# Patient Record
Sex: Male | Born: 1950
Health system: Southern US, Community
[De-identification: ages and names within clinical notes are randomized; demographics above are authoritative.]

## PROBLEM LIST (undated history)

## (undated) DIAGNOSIS — E785 Hyperlipidemia, unspecified: Secondary | ICD-10-CM

## (undated) DIAGNOSIS — Z972 Presence of dental prosthetic device (complete) (partial): Secondary | ICD-10-CM

## (undated) DIAGNOSIS — I10 Essential (primary) hypertension: Secondary | ICD-10-CM

## (undated) DIAGNOSIS — G231 Progressive supranuclear ophthalmoplegia [Steele-Richardson-Olszewski]: Secondary | ICD-10-CM

## (undated) DIAGNOSIS — G20C Parkinsonism, unspecified: Secondary | ICD-10-CM

## (undated) HISTORY — PX: PROSTATE SURGERY: SHX751

## (undated) HISTORY — DX: Essential (primary) hypertension: I10

## (undated) HISTORY — DX: Hyperlipidemia, unspecified: E78.5

## (undated) HISTORY — PX: LITHOTRIPSY: SUR834

## (undated) HISTORY — PX: TONSILLECTOMY: SUR1361

---

## 2007-03-16 ENCOUNTER — Ambulatory Visit: Payer: Self-pay | Admitting: Unknown Physician Specialty

## 2009-01-25 ENCOUNTER — Ambulatory Visit: Payer: Self-pay | Admitting: Family Medicine

## 2014-01-18 ENCOUNTER — Ambulatory Visit: Payer: Self-pay | Admitting: Urology

## 2014-01-18 DIAGNOSIS — R972 Elevated prostate specific antigen [PSA]: Secondary | ICD-10-CM | POA: Diagnosis not present

## 2014-01-18 DIAGNOSIS — N201 Calculus of ureter: Secondary | ICD-10-CM | POA: Diagnosis not present

## 2014-01-18 DIAGNOSIS — N400 Enlarged prostate without lower urinary tract symptoms: Secondary | ICD-10-CM | POA: Diagnosis not present

## 2014-01-18 DIAGNOSIS — R3129 Other microscopic hematuria: Secondary | ICD-10-CM | POA: Diagnosis not present

## 2014-01-19 DIAGNOSIS — Z23 Encounter for immunization: Secondary | ICD-10-CM | POA: Diagnosis not present

## 2014-01-27 DIAGNOSIS — E785 Hyperlipidemia, unspecified: Secondary | ICD-10-CM | POA: Diagnosis not present

## 2014-01-27 DIAGNOSIS — I10 Essential (primary) hypertension: Secondary | ICD-10-CM | POA: Diagnosis not present

## 2014-01-27 DIAGNOSIS — E039 Hypothyroidism, unspecified: Secondary | ICD-10-CM | POA: Diagnosis not present

## 2014-01-27 DIAGNOSIS — G500 Trigeminal neuralgia: Secondary | ICD-10-CM | POA: Diagnosis not present

## 2014-01-28 ENCOUNTER — Ambulatory Visit: Payer: Self-pay | Admitting: Family Medicine

## 2014-01-28 DIAGNOSIS — I77811 Abdominal aortic ectasia: Secondary | ICD-10-CM | POA: Diagnosis not present

## 2014-01-28 DIAGNOSIS — I714 Abdominal aortic aneurysm, without rupture: Secondary | ICD-10-CM | POA: Diagnosis not present

## 2014-08-01 DIAGNOSIS — R1310 Dysphagia, unspecified: Secondary | ICD-10-CM | POA: Diagnosis not present

## 2014-08-01 DIAGNOSIS — I10 Essential (primary) hypertension: Secondary | ICD-10-CM | POA: Diagnosis not present

## 2014-08-04 DIAGNOSIS — R1310 Dysphagia, unspecified: Secondary | ICD-10-CM | POA: Diagnosis not present

## 2014-08-04 DIAGNOSIS — IMO0001 Reserved for inherently not codable concepts without codable children: Secondary | ICD-10-CM | POA: Insufficient documentation

## 2014-08-18 ENCOUNTER — Ambulatory Visit
Admit: 2014-08-18 | Disposition: A | Discharge status: Home / Self Care | Payer: Self-pay | Attending: Unknown Physician Specialty | Admitting: Unknown Physician Specialty

## 2014-08-18 DIAGNOSIS — K222 Esophageal obstruction: Secondary | ICD-10-CM | POA: Diagnosis not present

## 2014-08-18 DIAGNOSIS — E785 Hyperlipidemia, unspecified: Secondary | ICD-10-CM | POA: Diagnosis not present

## 2014-08-18 DIAGNOSIS — Z79899 Other long term (current) drug therapy: Secondary | ICD-10-CM | POA: Diagnosis not present

## 2014-08-18 DIAGNOSIS — K210 Gastro-esophageal reflux disease with esophagitis: Secondary | ICD-10-CM | POA: Diagnosis not present

## 2014-08-18 DIAGNOSIS — R1310 Dysphagia, unspecified: Secondary | ICD-10-CM | POA: Diagnosis not present

## 2014-08-18 DIAGNOSIS — K2900 Acute gastritis without bleeding: Secondary | ICD-10-CM | POA: Diagnosis not present

## 2014-08-18 DIAGNOSIS — I10 Essential (primary) hypertension: Secondary | ICD-10-CM | POA: Diagnosis not present

## 2014-08-18 DIAGNOSIS — Z7982 Long term (current) use of aspirin: Secondary | ICD-10-CM | POA: Diagnosis not present

## 2014-08-18 DIAGNOSIS — E039 Hypothyroidism, unspecified: Secondary | ICD-10-CM | POA: Diagnosis not present

## 2014-08-18 DIAGNOSIS — Z9889 Other specified postprocedural states: Secondary | ICD-10-CM | POA: Diagnosis not present

## 2014-08-18 DIAGNOSIS — K2960 Other gastritis without bleeding: Secondary | ICD-10-CM | POA: Diagnosis not present

## 2014-08-18 DIAGNOSIS — K2970 Gastritis, unspecified, without bleeding: Secondary | ICD-10-CM | POA: Diagnosis not present

## 2014-08-18 DIAGNOSIS — K2270 Barrett's esophagus without dysplasia: Secondary | ICD-10-CM | POA: Diagnosis not present

## 2014-08-18 DIAGNOSIS — Z880 Allergy status to penicillin: Secondary | ICD-10-CM | POA: Diagnosis not present

## 2014-08-22 LAB — SURGICAL PATHOLOGY

## 2014-08-31 DIAGNOSIS — R1310 Dysphagia, unspecified: Secondary | ICD-10-CM | POA: Diagnosis not present

## 2014-08-31 DIAGNOSIS — I10 Essential (primary) hypertension: Secondary | ICD-10-CM | POA: Diagnosis not present

## 2014-10-07 ENCOUNTER — Telehealth: Payer: Self-pay | Admitting: *Deleted

## 2014-10-07 NOTE — Telephone Encounter (Signed)
Pt called and said that he feels like his sinus infection is back and was wondering if someone could call in a zpack. Would like a call back at phone number listed [phone]

## 2014-10-07 NOTE — Telephone Encounter (Signed)
Called and spoke with patient, he states that he has been congested. He has bells 'palsy, so it causes a lot of pain in his face. He is requesting an antibiotic, he states that he will call back when Dr.Crissman comes back in.  Izora Gala will you please have [place] call this patient.

## 2014-10-11 DIAGNOSIS — E039 Hypothyroidism, unspecified: Secondary | ICD-10-CM | POA: Insufficient documentation

## 2014-10-11 DIAGNOSIS — G500 Trigeminal neuralgia: Secondary | ICD-10-CM | POA: Insufficient documentation

## 2014-10-11 DIAGNOSIS — K2270 Barrett's esophagus without dysplasia: Secondary | ICD-10-CM | POA: Insufficient documentation

## 2014-10-11 DIAGNOSIS — K209 Esophagitis, unspecified: Secondary | ICD-10-CM

## 2014-10-11 DIAGNOSIS — I10 Essential (primary) hypertension: Secondary | ICD-10-CM | POA: Insufficient documentation

## 2014-10-11 DIAGNOSIS — R1310 Dysphagia, unspecified: Secondary | ICD-10-CM

## 2014-10-11 DIAGNOSIS — E785 Hyperlipidemia, unspecified: Secondary | ICD-10-CM

## 2014-10-11 MED ORDER — AZITHROMYCIN 250 MG PO TABS: ORAL_TABLET | ORAL | Status: DC

## 2014-10-11 NOTE — Telephone Encounter (Signed)
Pt cant come in with clasic sinusitis sx will rx

## 2014-10-12 ENCOUNTER — Ambulatory Visit: Payer: Self-pay | Admitting: Unknown Physician Specialty

## 2014-10-17 ENCOUNTER — Telehealth: Payer: Self-pay

## 2014-10-17 MED ORDER — BENZONATATE 100 MG PO CAPS: 100.0000 mg | ORAL_CAPSULE | Freq: Two times a day (BID) | ORAL | Status: DC | PRN

## 2014-10-17 NOTE — Telephone Encounter (Signed)
Call pt 

## 2014-10-17 NOTE — Telephone Encounter (Signed)
Patient states he finished his zpak on Sunday.  Saturday with headache and pressure, aches, chills stayed in bed all day.  Slept well Saturday night, sweated a lot and was much better as far as aches but his congestion and cough are still there.  He is wondering if he should try another antibiotic (NO PENICILLIN) - [facility]

## 2014-10-17 NOTE — Telephone Encounter (Signed)
Still caughing but better will give tessalon

## 2015-01-13 ENCOUNTER — Ambulatory Visit: Payer: Self-pay | Admitting: Urology

## 2015-01-24 ENCOUNTER — Ambulatory Visit: Payer: Self-pay | Admitting: Urology

## 2015-01-31 ENCOUNTER — Encounter: Payer: Self-pay | Admitting: Family Medicine

## 2015-01-31 ENCOUNTER — Ambulatory Visit (INDEPENDENT_AMBULATORY_CARE_PROVIDER_SITE_OTHER): Payer: Medicare Other | Admitting: Family Medicine

## 2015-01-31 ENCOUNTER — Other Ambulatory Visit: Payer: Self-pay | Admitting: Family Medicine

## 2015-01-31 VITALS — BP 135/79 | HR 56 | Temp 97.6°F | Ht 67.3 in | Wt 191.0 lb

## 2015-01-31 DIAGNOSIS — K209 Esophagitis, unspecified without bleeding: Secondary | ICD-10-CM

## 2015-01-31 DIAGNOSIS — E039 Hypothyroidism, unspecified: Secondary | ICD-10-CM

## 2015-01-31 DIAGNOSIS — I10 Essential (primary) hypertension: Secondary | ICD-10-CM

## 2015-01-31 DIAGNOSIS — Z23 Encounter for immunization: Secondary | ICD-10-CM | POA: Diagnosis not present

## 2015-01-31 DIAGNOSIS — Z0000 Encounter for general adult medical examination without abnormal findings: Secondary | ICD-10-CM | POA: Diagnosis not present

## 2015-01-31 DIAGNOSIS — K2270 Barrett's esophagus without dysplasia: Secondary | ICD-10-CM | POA: Diagnosis not present

## 2015-01-31 DIAGNOSIS — E785 Hyperlipidemia, unspecified: Secondary | ICD-10-CM

## 2015-01-31 LAB — URINALYSIS, ROUTINE W REFLEX MICROSCOPIC
BILIRUBIN UA: NEGATIVE
GLUCOSE, UA: NEGATIVE
KETONES UA: NEGATIVE
Leukocytes, UA: NEGATIVE
Nitrite, UA: NEGATIVE
PROTEIN UA: NEGATIVE
RBC UA: NEGATIVE
SPEC GRAV UA: 1.02 (ref 1.005–1.030)
Urobilinogen, Ur: 0.2 mg/dL (ref 0.2–1.0)
pH, UA: 7 (ref 5.0–7.5)

## 2015-01-31 MED ORDER — LOSARTAN POTASSIUM-HCTZ 100-12.5 MG PO TABS: 1.0000 | ORAL_TABLET | Freq: Every day | ORAL | Status: DC

## 2015-01-31 MED ORDER — OMEPRAZOLE 40 MG PO CPDR: 40.0000 mg | DELAYED_RELEASE_CAPSULE | Freq: Every day | ORAL | Status: DC

## 2015-01-31 MED ORDER — AZITHROMYCIN 250 MG PO TABS: ORAL_TABLET | ORAL | Status: DC

## 2015-01-31 MED ORDER — LEVOTHYROXINE SODIUM 50 MCG PO TABS: 50.0000 ug | ORAL_TABLET | Freq: Every day | ORAL | Status: DC

## 2015-01-31 MED ORDER — FLUTICASONE PROPIONATE 50 MCG/ACT NA SUSP: 2.0000 | Freq: Every day | NASAL | Status: DC

## 2015-01-31 MED ORDER — AMLODIPINE BESYLATE 5 MG PO TABS: 5.0000 mg | ORAL_TABLET | Freq: Every day | ORAL | Status: DC

## 2015-01-31 NOTE — Progress Notes (Signed)
BP 135/79 mmHg  Pulse 56  Temp(Src) 97.6 F (36.4 C)  Ht 5' 7.3" (1.709 m)  Wt 191 lb (86.637 kg)  BMI 29.66 kg/m2  SpO2 99%   Subjective:    Patient ID: Arthur Richardson, male    DOB: Apr 29, 1951, 64 y.o.   MRN: [id number]  HPI: Arthur Richardson is a 64 y.o. male  Chief Complaint  Patient presents with  . Annual Exam   AWV metrics done  Patient doing well with omeprazole for Barrett's no complaints of dysphagia.  Hypertension doing well good control home checks doing well.  Thyroid no complaints no symptoms takes medicines faithfully  Hypercholesterol doing well no complaints from medications.  Takes medicines without side effects and faithfully.  Relevant past medical, surgical, family and social history reviewed and updated as indicated. Interim medical history since our last visit reviewed. Allergies and medications reviewed and updated.  Review of Systems  Constitutional: Negative.   HENT: Negative.   Eyes: Negative.   Respiratory: Negative.   Cardiovascular: Negative.   Gastrointestinal: Negative.   Endocrine: Negative.   Genitourinary: Negative.   Musculoskeletal: Negative.   Skin: Negative.   Allergic/Immunologic: Negative.   Neurological: Negative.   Hematological: Negative.   Psychiatric/Behavioral: Negative.     Per HPI unless specifically indicated above     Objective:    BP 135/79 mmHg  Pulse 56  Temp(Src) 97.6 F (36.4 C)  Ht 5' 7.3" (1.709 m)  Wt 191 lb (86.637 kg)  BMI 29.66 kg/m2  SpO2 99%  Wt Readings from Last 3 Encounters:  01/31/15 191 lb (86.637 kg)  08/31/14 191 lb (86.637 kg)    Physical Exam  Constitutional: He is oriented to person, place, and time. He appears well-developed and well-nourished.  HENT:  Head: Normocephalic and atraumatic.  Right Ear: External ear normal.  Left Ear: External ear normal.  Eyes: Conjunctivae and EOM are normal. Pupils are equal, round, and reactive to light.  Neck: Normal range  of motion. Neck supple.  Cardiovascular: Normal rate, regular rhythm, normal heart sounds and intact distal pulses.   Pulmonary/Chest: Effort normal and breath sounds normal.  Abdominal: Soft. Bowel sounds are normal. There is no splenomegaly or hepatomegaly.  Genitourinary:  Will be done at urology tomorrow.  Musculoskeletal: Normal range of motion.  Neurological: He is alert and oriented to person, place, and time. He has normal reflexes.  Skin: No rash noted. No erythema.  Psychiatric: He has a normal mood and affect. His behavior is normal. Judgment and thought content normal.    Results for orders placed or performed during the hospital encounter of 08/18/14  Surgical pathology  Result Value Ref Range   SURGICAL PATHOLOGY      Surgical Procedure CASE: [id number] PATIENT: Arthur Richardson Surgical Pathology Report     SPECIMEN SUBMITTED: A. Antrum, biopsy B. GEJ, biopsy  CLINICAL HISTORY: None provided  PRE-OPERATIVE DIAGNOSIS: Dysphagia  POST-OPERATIVE DIAGNOSIS: Erosive gastritis, Schatzki ring     DIAGNOSIS: A. ANTRUM; BIOPSY: - ANTRAL MUCOSA WITH CHANGES CONSISTENT WITH HEALING EROSIVE GASTRITIS. - NEGATIVE FOR H. PYLORI, DYSPLASIA, AND MALIGNANCY.  B. GE JUNCTION; BIOPSY: - SQUAMOCOLUMNAR MUCOSA WITH CHANGES CONSISTENT WITH REFLUX ESOPHAGITIS. - FOCAL INTESTINAL METAPLASIA, CORRELATION WITH ENDOSCOPIC FINDINGS IS REQUIRED. - NEGATIVE FOR DYSPLASIA AND MALIGNANCY.   GROSS DESCRIPTION:  A. Labeled: antral biopsies Tissue Fragment(s): 2 Measurement: 0.3 cm Comment: pink  Entirely submitted in cassette(s): 1  B. Labeled: gej biopsy Tissue Fragment(s): 2 Measurement: 0.2-0.3 cm Comment: pink  Entirely submitted in cassette(s): 1     Final Diagnosis perfo rmed by Quay Burow, MD.  Electronically signed 08/19/2014 2:00:24PM    The electronic signature indicates that the named Attending Pathologist has evaluated the specimen  Technical  component performed at [facility], [address] [place], [place] [address] Lab: [phone] Dir: Darrick Penna. Evette Doffing, MD  Professional component performed at [facility], [facility], [place], [place], [place] [address] Lab: [phone] Dir: Dellia Nims. Reuel Derby, MD        Assessment & Plan:   Problem List Items Addressed This Visit      Cardiovascular and Mediastinum   Hypertension    The current medical regimen is effective;  continue present plan and medications.       Relevant Medications   losartan-hydrochlorothiazide (HYZAAR) 100-12.5 MG tablet   amLODipine (NORVASC) 5 MG tablet   Other Relevant Orders   Comprehensive metabolic panel   CBC with Differential/Platelet   Urinalysis, Routine w reflex microscopic (not at [facility])     Digestive   Barrett's esophagus with esophagitis    The current medical regimen is effective;  continue present plan and medications.       Relevant Orders   Comprehensive metabolic panel   CBC with Differential/Platelet     Endocrine   Hypothyroidism    The current medical regimen is effective;  continue present plan and medications.       Relevant Medications   levothyroxine (SYNTHROID, LEVOTHROID) 50 MCG tablet   Other Relevant Orders   Comprehensive metabolic panel   TSH     Other   Hyperlipidemia    Diet controled      Relevant Medications   losartan-hydrochlorothiazide (HYZAAR) 100-12.5 MG tablet   amLODipine (NORVASC) 5 MG tablet   Other Relevant Orders   Comprehensive metabolic panel   Lipid panel    Other Visit Diagnoses    Immunization due    -  Primary    Relevant Orders    Flu Vaccine QUAD 36+ mos PF IM (Fluarix & Fluzone Quad PF) (Completed)    PE (physical exam), annual            Follow up plan: Return in about 6 months (around 08/01/2015), or if symptoms worsen or fail to improve, for Med check BMP.

## 2015-01-31 NOTE — Assessment & Plan Note (Signed)
The current medical regimen is effective;  continue present plan and medications.  

## 2015-01-31 NOTE — Assessment & Plan Note (Signed)
Diet controled 

## 2015-02-01 ENCOUNTER — Ambulatory Visit (INDEPENDENT_AMBULATORY_CARE_PROVIDER_SITE_OTHER): Payer: Medicare Other | Admitting: Urology

## 2015-02-01 ENCOUNTER — Encounter: Payer: Self-pay | Admitting: Family Medicine

## 2015-02-01 ENCOUNTER — Encounter: Payer: Self-pay | Admitting: Urology

## 2015-02-01 VITALS — BP 158/94 | HR 66 | Ht 68.0 in | Wt 191.7 lb

## 2015-02-01 DIAGNOSIS — R972 Elevated prostate specific antigen [PSA]: Secondary | ICD-10-CM

## 2015-02-01 DIAGNOSIS — N401 Enlarged prostate with lower urinary tract symptoms: Secondary | ICD-10-CM | POA: Diagnosis not present

## 2015-02-01 DIAGNOSIS — N400 Enlarged prostate without lower urinary tract symptoms: Secondary | ICD-10-CM | POA: Diagnosis not present

## 2015-02-01 LAB — CBC WITH DIFFERENTIAL/PLATELET
BASOS: 1 %
Basophils Absolute: 0.1 10*3/uL (ref 0.0–0.2)
EOS (ABSOLUTE): 0.2 10*3/uL (ref 0.0–0.4)
EOS: 3 %
HEMATOCRIT: 44.7 % (ref 37.5–51.0)
Hemoglobin: 15.2 g/dL (ref 12.6–17.7)
IMMATURE GRANULOCYTES: 0 %
Immature Grans (Abs): 0 10*3/uL (ref 0.0–0.1)
LYMPHS ABS: 2.4 10*3/uL (ref 0.7–3.1)
Lymphs: 37 %
MCH: 31.7 pg (ref 26.6–33.0)
MCHC: 34 g/dL (ref 31.5–35.7)
MCV: 93 fL (ref 79–97)
Monocytes Absolute: 0.7 10*3/uL (ref 0.1–0.9)
Monocytes: 10 %
NEUTROS PCT: 49 %
Neutrophils Absolute: 3.3 10*3/uL (ref 1.4–7.0)
PLATELETS: 250 10*3/uL (ref 150–379)
RBC: 4.8 x10E6/uL (ref 4.14–5.80)
RDW: 13.2 % (ref 12.3–15.4)
WBC: 6.6 10*3/uL (ref 3.4–10.8)

## 2015-02-01 LAB — MICROSCOPIC EXAMINATION
BACTERIA UA: NONE SEEN
EPITHELIAL CELLS (NON RENAL): NONE SEEN /HPF (ref 0–10)
RBC, UA: NONE SEEN /hpf (ref 0–?)
RENAL EPITHEL UA: NONE SEEN /HPF
WBC, UA: NONE SEEN /hpf (ref 0–?)

## 2015-02-01 LAB — COMPREHENSIVE METABOLIC PANEL
ALBUMIN: 4.4 g/dL (ref 3.6–4.8)
ALT: 34 IU/L (ref 0–44)
AST: 27 IU/L (ref 0–40)
Albumin/Globulin Ratio: 1.6 (ref 1.1–2.5)
Alkaline Phosphatase: 120 IU/L — ABNORMAL HIGH (ref 39–117)
BUN / CREAT RATIO: 12 (ref 10–22)
BUN: 13 mg/dL (ref 8–27)
Bilirubin Total: 0.4 mg/dL (ref 0.0–1.2)
CALCIUM: 9.8 mg/dL (ref 8.6–10.2)
CHLORIDE: 99 mmol/L (ref 97–108)
CO2: 24 mmol/L (ref 18–29)
CREATININE: 1.08 mg/dL (ref 0.76–1.27)
GFR calc non Af Amer: 71 mL/min/{1.73_m2} (ref >59)
GFR, EST AFRICAN AMERICAN: 82 mL/min/{1.73_m2} (ref >59)
GLUCOSE: 102 mg/dL — AB (ref 65–99)
Globulin, Total: 2.7 g/dL (ref 1.5–4.5)
Potassium: 4.8 mmol/L (ref 3.5–5.2)
Sodium: 139 mmol/L (ref 134–144)
TOTAL PROTEIN: 7.1 g/dL (ref 6.0–8.5)

## 2015-02-01 LAB — URINALYSIS, COMPLETE
BILIRUBIN UA: NEGATIVE
Glucose, UA: NEGATIVE
Ketones, UA: NEGATIVE
LEUKOCYTES UA: NEGATIVE
Nitrite, UA: NEGATIVE
PH UA: 7 (ref 5.0–7.5)
PROTEIN UA: NEGATIVE
RBC, UA: NEGATIVE
Specific Gravity, UA: 1.02 (ref 1.005–1.030)
Urobilinogen, Ur: 0.2 mg/dL (ref 0.2–1.0)

## 2015-02-01 LAB — LIPID PANEL
Chol/HDL Ratio: 3.9 ratio units (ref 0.0–5.0)
Cholesterol, Total: 224 mg/dL — ABNORMAL HIGH (ref 100–199)
HDL: 57 mg/dL (ref >39)
LDL CALC: 146 mg/dL — AB (ref 0–99)
Triglycerides: 106 mg/dL (ref 0–149)
VLDL Cholesterol Cal: 21 mg/dL (ref 5–40)

## 2015-02-01 LAB — TSH: TSH: 1.9 u[IU]/mL (ref 0.450–4.500)

## 2015-02-01 NOTE — Progress Notes (Signed)
02/01/2015 9:46 AM   Arthur Richardson Arthur Richardson [birth year] [id number]  Referring provider: No referring provider defined for this encounter.  Chief Complaint  Patient presents with  . Elevated PSA    1year    HPI: Patient here for routine yearly follow-up for persistently C PSA elevation of 3.5-44. Said biopsy in the past by Dr. Ernst Richardson negative for cancer he has no voiding symptomatology of urgency frequency dysuria or nocturia set her up. He generally has excellent voiding and a good sex life   PMH: Past Medical History  Diagnosis Date  . Hypertension   . Hyperlipidemia     Surgical History: Past Surgical History  Procedure Laterality Date  . Prostate surgery      Biopsy= Negative  . Tonsillectomy    . Lithotripsy  09/1998 & 07/29/1999    Due to nephrolithiasis    Home Medications:    Medication List       This list is accurate as of: 02/01/15  9:46 AM.  Always use your most recent med list.               amLODipine 5 MG tablet  Commonly known as:  NORVASC  Take 1 tablet (5 mg total) by mouth daily.     aspirin EC 81 MG tablet  Take 81 mg by mouth daily.     azithromycin 250 MG tablet  Commonly known as:  ZITHROMAX  2 now then 1 a day     fexofenadine 180 MG tablet  Commonly known as:  ALLEGRA  Take 180 mg by mouth daily.     fluticasone 50 MCG/ACT nasal spray  Commonly known as:  FLONASE  Place 2 sprays into both nostrils daily.     hydrocortisone 2.5 % rectal cream  Commonly known as:  ANUSOL-HC  Place 1 application rectally daily as needed for hemorrhoids or itching.     levothyroxine 50 MCG tablet  Commonly known as:  SYNTHROID, LEVOTHROID  Take 1 tablet (50 mcg total) by mouth daily before breakfast.     losartan-hydrochlorothiazide 100-12.5 MG tablet  Commonly known as:  HYZAAR  Take 1 tablet by mouth daily.     omeprazole 40 MG capsule  Commonly known as:  PRILOSEC  Take 1 capsule (40 mg total) by mouth daily.        Allergies:    Allergies  Allergen Reactions  . Penicillins     Family History: Family History  Problem Relation Age of Onset  . Stroke Father   . Cancer Sister     Pancreatic Cancer    Social History:  reports that he quit smoking about 35 years ago. His smoking use included Cigarettes. His smokeless tobacco use includes Chew. He reports that he drinks about 1.8 - 2.4 oz of alcohol per week. He reports that he does not use illicit drugs.  ROS: UROLOGY Frequent Urination?: No Hard to postpone urination?: No Burning/pain with urination?: No Get up at night to urinate?: No Leakage of urine?: No Urine stream starts and stops?: No Trouble starting stream?: No Do you have to strain to urinate?: No Blood in urine?: No Urinary tract infection?: No Sexually transmitted disease?: No Injury to kidneys or bladder?: No Painful intercourse?: No Weak stream?: No Erection problems?: No Penile pain?: No  Gastrointestinal Nausea?: No Vomiting?: No Indigestion/heartburn?: No Diarrhea?: No Constipation?: No  Constitutional Fever: No Night sweats?: No Weight loss?: No Fatigue?: No  Skin Skin rash/lesions?: No Itching?: No  Eyes Blurred vision?: No  Double vision?: No  Ears/Nose/Throat Sore throat?: No Sinus problems?: No  Hematologic/Lymphatic Swollen glands?: No Easy bruising?: No  Cardiovascular Leg swelling?: No Chest pain?: No  Respiratory Cough?: No Shortness of breath?: No  Endocrine Excessive thirst?: No  Musculoskeletal Back pain?: No Joint pain?: No  Neurological Headaches?: No Dizziness?: No  Psychologic Depression?: No Anxiety?: No  Physical Exam: BP 158/94 mmHg  Pulse 66  Ht 5\' 8"  (1.727 m)  Wt 191 lb 11.2 oz (86.955 kg)  BMI 29.15 kg/m2  Constitutional:  Alert and oriented, No acute distress. HEENT: [place] AT, moist mucus membranes.  Trachea midline, no masses. Cardiovascular: No clubbing, cyanosis, or edema. Respiratory: Normal respiratory  effort, no increased work of breathing. GI: Abdomen is soft, nontender, nondistended, no abdominal masses GU: No CVA tenderness. Penis is normal length circumcised. Testes are descended and normal with no masses from sildenafil. Rectal sphincter is good there are no rectal masses hemorrhoids or fissures. Prostate is grade 2 over 4 smooth nonnodular and firm Skin: No rashes, bruises or suspicious lesions. Lymph: No cervical or inguinal adenopathy. Neurologic: Grossly intact, no focal deficits, moving all 4 extremities. Psychiatric: Normal mood and affect.  Laboratory Data: Lab Results  Component Value Date   WBC 6.6 01/31/2015   HCT 44.7 01/31/2015    Lab Results  Component Value Date   CREATININE 1.08 01/31/2015    No results found for: PSA  No results found for: TESTOSTERONE  No results found for: HGBA1C  Urinalysis    Component Value Date/Time   GLUCOSEU Negative 01/31/2015 0000   BILIRUBINUR Negative 01/31/2015 0000   NITRITE Negative 01/31/2015 0000   LEUKOCYTESUR Negative 01/31/2015 0000    Pertinent Imaging: None  Assessment & Plan:    1. Elevated PSA patient has had a stable PSA of 3-4 over the last 10 years. He's been seen by Dr. Ernst Richardson on a yearly basis for a retired. I saw him last year and it remained stable at 3.5. We obtained a PSA today as a screening exam. He's had negative biopsy in the past for his elevated PSA. He definitely does not want another biopsy. My physical exam of his prostate revealed no rectal masses prostate to be slightly enlarged. He's asymptomatic voiding symptoms. My plan is to see him in another urinalysis PSA elevates to a higher level then 5   - Urinalysis, Complete - PSA   No Follow-up on file.  Arthur Richardson, [facility] [address] [place], [place] [address] [phone]

## 2015-02-02 LAB — PSA: PROSTATE SPECIFIC AG, SERUM: 3.8 ng/mL (ref 0.0–4.0)

## 2015-04-09 ENCOUNTER — Other Ambulatory Visit: Payer: Self-pay | Admitting: Family Medicine

## 2015-06-05 ENCOUNTER — Ambulatory Visit (INDEPENDENT_AMBULATORY_CARE_PROVIDER_SITE_OTHER): Payer: Medicare Other | Admitting: Family Medicine

## 2015-06-05 ENCOUNTER — Encounter: Payer: Self-pay | Admitting: Family Medicine

## 2015-06-05 VITALS — BP 132/78 | HR 71 | Temp 98.2°F | Ht 67.0 in | Wt 192.0 lb

## 2015-06-05 DIAGNOSIS — J019 Acute sinusitis, unspecified: Secondary | ICD-10-CM

## 2015-06-05 MED ORDER — HYDROCOD POLST-CPM POLST ER 10-8 MG/5ML PO SUER: 2.5000 mL | Freq: Two times a day (BID) | ORAL | Status: DC | PRN

## 2015-06-05 MED ORDER — AZITHROMYCIN 250 MG PO TABS: ORAL_TABLET | ORAL | Status: DC

## 2015-06-05 NOTE — Assessment & Plan Note (Signed)
Discussed sinusitis care and treatment of over-the-counter medications such as Mucinex Mucinex D Patient's hard coughing will give Tussionex patient understands its home for the medicine and will not drive with this

## 2015-06-05 NOTE — Progress Notes (Signed)
BP 132/78 mmHg  Pulse 71  Temp(Src) 98.2 F (36.8 C)  Ht 5\' 7"  (1.702 m)  Wt 192 lb (87.091 kg)  BMI 30.06 kg/m2  SpO2 98%   Subjective:    Patient ID: Arthur Richardson, male    DOB: 03-May-1950, 65 y.o.   MRN: [id number]  HPI: Arthur Richardson is a 65 y.o. male  Chief Complaint  Patient presents with  . URI   Patient with a month-long of feeling bad achy fever cough and head congestion and drainage with large amount of mucus coming out of his head a lot of facial pressure and sloshing-type sensation when he bends over initially got somewhat better over the last 2 weeks now over this last week is gotten much worse. Coughing a great deal which is productive Chest pain from coughing and states sore Otherwise blood pressure and medications of been doing well Relevant past medical, surgical, family and social history reviewed and updated as indicated. Interim medical history since our last visit reviewed. Allergies and medications reviewed and updated.  Review of Systems  Constitutional: Positive for fever, chills, diaphoresis and fatigue.  HENT: Positive for congestion, postnasal drip, rhinorrhea, sinus pressure, sneezing and sore throat.   Respiratory: Positive for cough, choking and shortness of breath.   Cardiovascular: Negative for chest pain, palpitations and leg swelling.  Gastrointestinal: Negative.     Per HPI unless specifically indicated above     Objective:    BP 132/78 mmHg  Pulse 71  Temp(Src) 98.2 F (36.8 C)  Ht 5\' 7"  (1.702 m)  Wt 192 lb (87.091 kg)  BMI 30.06 kg/m2  SpO2 98%  Wt Readings from Last 3 Encounters:  06/05/15 192 lb (87.091 kg)  02/01/15 191 lb 11.2 oz (86.955 kg)  01/31/15 191 lb (86.637 kg)    Physical Exam  Constitutional: He is oriented to person, place, and time. He appears well-developed and well-nourished. No distress.  HENT:  Head: Normocephalic and atraumatic.  Right Ear: Hearing and external ear normal.  Left Ear:  Hearing and external ear normal.  Nose: Nose normal.  Mouth/Throat: Oropharyngeal exudate present.  Eyes: Conjunctivae and lids are normal. Right eye exhibits no discharge. Left eye exhibits no discharge. No scleral icterus.  Pulmonary/Chest: Effort normal. No respiratory distress. He has no wheezes. He has no rales.  rhonchi  Musculoskeletal: Normal range of motion.  Lymphadenopathy:    He has no cervical adenopathy.  Neurological: He is alert and oriented to person, place, and time.  Skin: Skin is intact. No rash noted.  Psychiatric: He has a normal mood and affect. His speech is normal and behavior is normal. Judgment and thought content normal. Cognition and memory are normal.    Results for orders placed or performed in visit on 02/01/15  Microscopic Examination  Result Value Ref Range   WBC, UA None seen 0 -  5 /hpf   RBC, UA None seen 0 -  2 /hpf   Epithelial Cells (non renal) None seen 0 - 10 /hpf   Renal Epithel, UA None seen None seen /hpf   Bacteria, UA None seen None seen/Few  Urinalysis, Complete  Result Value Ref Range   Specific Gravity, UA 1.020 1.005 - 1.030   pH, UA 7.0 5.0 - 7.5   Color, UA Yellow Yellow   Appearance Ur Clear Clear   Leukocytes, UA Negative Negative   Protein, UA Negative Negative/Trace   Glucose, UA Negative Negative   Ketones, UA Negative Negative  RBC, UA Negative Negative   Bilirubin, UA Negative Negative   Urobilinogen, Ur 0.2 0.2 - 1.0 mg/dL   Nitrite, UA Negative Negative   Microscopic Examination See below:   PSA  Result Value Ref Range   Prostate Specific Ag, Serum 3.8 0.0 - 4.0 ng/mL      Assessment & Plan:   Problem List Items Addressed This Visit      Respiratory   Acute sinusitis - Primary    Discussed sinusitis care and treatment of over-the-counter medications such as Mucinex Mucinex D Patient's hard coughing will give Tussionex patient understands its home for the medicine and will not drive with this        Relevant Medications   azithromycin (ZITHROMAX) 250 MG tablet   chlorpheniramine-HYDROcodone (TUSSIONEX PENNKINETIC ER) 10-8 MG/5ML SUER       Follow up plan: Return for As scheduled.

## 2015-06-13 ENCOUNTER — Encounter: Payer: Self-pay | Admitting: Family Medicine

## 2015-07-21 ENCOUNTER — Telehealth: Payer: Self-pay

## 2015-07-21 NOTE — Telephone Encounter (Signed)
Pt called stating he was seen back in October of 2016. Pt stated he just received a [facility] bill and then realized he had not received his PSA results. Results were given.

## 2015-08-02 ENCOUNTER — Ambulatory Visit: Payer: Medicare Other | Admitting: Family Medicine

## 2015-08-07 ENCOUNTER — Encounter: Payer: Self-pay | Admitting: Family Medicine

## 2015-08-07 ENCOUNTER — Ambulatory Visit (INDEPENDENT_AMBULATORY_CARE_PROVIDER_SITE_OTHER): Payer: Medicare Other | Admitting: Family Medicine

## 2015-08-07 VITALS — BP 133/84 | HR 71 | Temp 98.1°F | Ht 67.2 in | Wt 195.0 lb

## 2015-08-07 DIAGNOSIS — Z113 Encounter for screening for infections with a predominantly sexual mode of transmission: Secondary | ICD-10-CM | POA: Diagnosis not present

## 2015-08-07 DIAGNOSIS — E039 Hypothyroidism, unspecified: Secondary | ICD-10-CM | POA: Diagnosis not present

## 2015-08-07 DIAGNOSIS — K209 Esophagitis, unspecified: Secondary | ICD-10-CM | POA: Diagnosis not present

## 2015-08-07 DIAGNOSIS — K2270 Barrett's esophagus without dysplasia: Secondary | ICD-10-CM | POA: Diagnosis not present

## 2015-08-07 DIAGNOSIS — I10 Essential (primary) hypertension: Secondary | ICD-10-CM

## 2015-08-07 DIAGNOSIS — E785 Hyperlipidemia, unspecified: Secondary | ICD-10-CM

## 2015-08-07 MED ORDER — ACYCLOVIR 5 % EX OINT: 1.0000 "application " | TOPICAL_OINTMENT | CUTANEOUS | Status: DC

## 2015-08-07 NOTE — Assessment & Plan Note (Signed)
The current medical regimen is effective;  continue present plan and medications.  

## 2015-08-07 NOTE — Progress Notes (Signed)
BP 133/84 mmHg  Pulse 71  Temp(Src) 98.1 F (36.7 C)  Ht 5' 7.2" (1.707 m)  Wt 195 lb (88.451 kg)  BMI 30.36 kg/m2  SpO2 99%   Subjective:    Patient ID: Arthur Richardson, male    DOB: [birth year], 65 y.o.   MRN: [id number]  HPI: Arthur Richardson is a 65 y.o. male  Chief Complaint  Patient presents with  . Hypertension  Patient doing well with blood pressure no complaints from medications takes faithfully with no side effects. Thyroid doing well with no side effects from medicines takes faithfully Reflux the same doing well with no complaints Allergies doing well also even in the allergy season.  Relevant past medical, surgical, family and social history reviewed and updated as indicated. Interim medical history since our last visit reviewed. Allergies and medications reviewed and updated.  Review of Systems  Constitutional: Negative.   Respiratory: Negative.   Cardiovascular: Negative.     Per HPI unless specifically indicated above     Objective:    BP 133/84 mmHg  Pulse 71  Temp(Src) 98.1 F (36.7 C)  Ht 5' 7.2" (1.707 m)  Wt 195 lb (88.451 kg)  BMI 30.36 kg/m2  SpO2 99%  Wt Readings from Last 3 Encounters:  08/07/15 195 lb (88.451 kg)  06/05/15 192 lb (87.091 kg)  02/01/15 191 lb 11.2 oz (86.955 kg)    Physical Exam  Constitutional: He is oriented to person, place, and time. He appears well-developed and well-nourished. No distress.  HENT:  Head: Normocephalic and atraumatic.  Right Ear: Hearing normal.  Left Ear: Hearing normal.  Nose: Nose normal.  Eyes: Conjunctivae and lids are normal. Right eye exhibits no discharge. Left eye exhibits no discharge. No scleral icterus.  Cardiovascular: Normal rate, regular rhythm and normal heart sounds.   Pulmonary/Chest: Effort normal and breath sounds normal. No respiratory distress.  Musculoskeletal: Normal range of motion.  Neurological: He is alert and oriented to person, place, and time.  Skin: Skin  is intact. No rash noted.  Psychiatric: He has a normal mood and affect. His speech is normal and behavior is normal. Judgment and thought content normal. Cognition and memory are normal.    Results for orders placed or performed in visit on 02/01/15  Microscopic Examination  Result Value Ref Range   WBC, UA None seen 0 -  5 /hpf   RBC, UA None seen 0 -  2 /hpf   Epithelial Cells (non renal) None seen 0 - 10 /hpf   Renal Epithel, UA None seen None seen /hpf   Bacteria, UA None seen None seen/Few  Urinalysis, Complete  Result Value Ref Range   Specific Gravity, UA 1.020 1.005 - 1.030   pH, UA 7.0 5.0 - 7.5   Color, UA Yellow Yellow   Appearance Ur Clear Clear   Leukocytes, UA Negative Negative   Protein, UA Negative Negative/Trace   Glucose, UA Negative Negative   Ketones, UA Negative Negative   RBC, UA Negative Negative   Bilirubin, UA Negative Negative   Urobilinogen, Ur 0.2 0.2 - 1.0 mg/dL   Nitrite, UA Negative Negative   Microscopic Examination See below:   PSA  Result Value Ref Range   Prostate Specific Ag, Serum 3.8 0.0 - 4.0 ng/mL      Assessment & Plan:   Problem List Items Addressed This Visit      Cardiovascular and Mediastinum   Hypertension - Primary    The current medical regimen  is effective;  continue present plan and medications.       Relevant Orders   Basic metabolic panel     Digestive   Barrett's esophagus with esophagitis    The current medical regimen is effective;  continue present plan and medications.         Endocrine   Hypothyroidism    The current medical regimen is effective;  continue present plan and medications.         Other   Hyperlipidemia    The current medical regimen is effective;  continue present plan and medications.        Other Visit Diagnoses    Routine screening for STI (sexually transmitted infection)        Relevant Orders    Hepatitis C Antibody        Follow up plan: Return in about 6 months  (around 02/06/2016) for Physical Exam.

## 2015-08-08 ENCOUNTER — Telehealth: Payer: Self-pay | Admitting: Family Medicine

## 2015-08-08 DIAGNOSIS — E871 Hypo-osmolality and hyponatremia: Secondary | ICD-10-CM

## 2015-08-08 LAB — BASIC METABOLIC PANEL
BUN/Creatinine Ratio: 16 (ref 10–24)
BUN: 16 mg/dL (ref 8–27)
CALCIUM: 10.1 mg/dL (ref 8.6–10.2)
CHLORIDE: 89 mmol/L — AB (ref 96–106)
CO2: 21 mmol/L (ref 18–29)
Creatinine, Ser: 1 mg/dL (ref 0.76–1.27)
GFR, EST AFRICAN AMERICAN: 90 mL/min/{1.73_m2} (ref >59)
GFR, EST NON AFRICAN AMERICAN: 78 mL/min/{1.73_m2} (ref >59)
Glucose: 109 mg/dL — ABNORMAL HIGH (ref 65–99)
Potassium: 4.2 mmol/L (ref 3.5–5.2)
Sodium: 130 mmol/L — ABNORMAL LOW (ref 134–144)

## 2015-08-08 LAB — HEPATITIS C ANTIBODY: Hep C Virus Ab: <0.1 s/co ratio (ref 0.0–0.9)

## 2015-08-08 NOTE — Telephone Encounter (Signed)
Phone call Discussed with patient low sodium patient drinking a lot of sodas will cut out liquids drink regular water smaller amounts recheck BMP in about a month.

## 2015-08-08 NOTE — Telephone Encounter (Signed)
-----   Message from Wynn Maudlin, [place] sent at 08/08/2015  5:21 PM EDT ----- labs

## 2015-09-07 ENCOUNTER — Other Ambulatory Visit: Payer: Medicare Other

## 2015-09-07 DIAGNOSIS — E871 Hypo-osmolality and hyponatremia: Secondary | ICD-10-CM | POA: Diagnosis not present

## 2015-09-08 LAB — BASIC METABOLIC PANEL
BUN/Creatinine Ratio: 9 — ABNORMAL LOW (ref 10–24)
BUN: 9 mg/dL (ref 8–27)
CO2: 21 mmol/L (ref 18–29)
Calcium: 9.5 mg/dL (ref 8.6–10.2)
Chloride: 95 mmol/L — ABNORMAL LOW (ref 96–106)
Creatinine, Ser: 1.04 mg/dL (ref 0.76–1.27)
GFR, EST AFRICAN AMERICAN: 86 mL/min/{1.73_m2} (ref >59)
GFR, EST NON AFRICAN AMERICAN: 74 mL/min/{1.73_m2} (ref >59)
Glucose: 107 mg/dL — ABNORMAL HIGH (ref 65–99)
POTASSIUM: 4.2 mmol/L (ref 3.5–5.2)
Sodium: 136 mmol/L (ref 134–144)

## 2015-09-11 ENCOUNTER — Encounter: Payer: Self-pay | Admitting: Family Medicine

## 2015-09-18 ENCOUNTER — Other Ambulatory Visit: Payer: Medicare Other

## 2015-12-12 ENCOUNTER — Other Ambulatory Visit: Payer: Self-pay | Admitting: Family Medicine

## 2016-01-06 IMAGING — CR DG ABDOMEN 1V
1 series · 1 of 1 positions shown · non-contrast
Comparison: None.

CLINICAL DATA: Microscopic hematuria. Prior history of urinary
tract calculi. No abdominal pain currently.

EXAM:
ABDOMEN - 1 VIEW

[kdxr kidney ureter bladder]
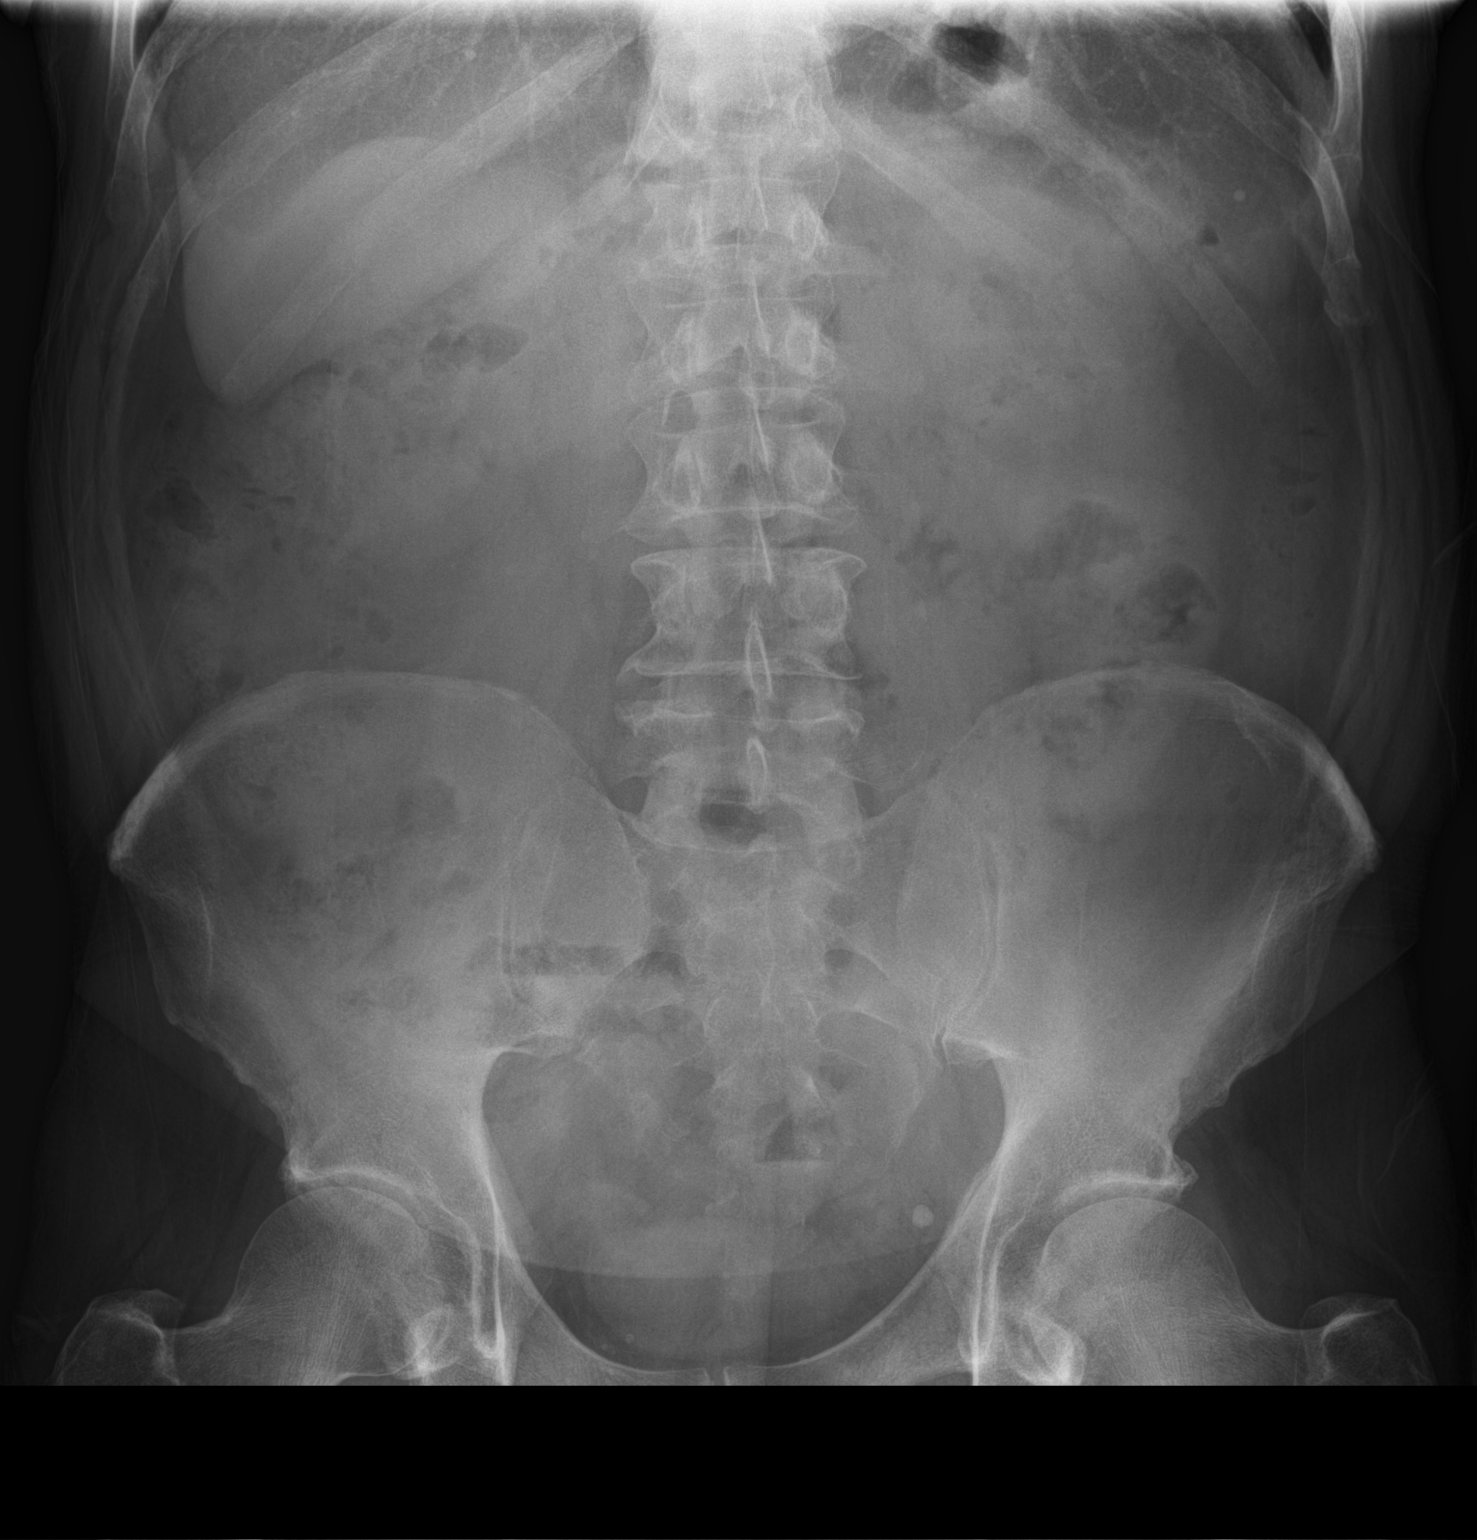

[1 of 1 positions shown; findings below may reference images not displayed]

FINDINGS: Phleboliths in both sides of the pelvis. No visible opaque urinary
tract calculi on either side. Bowel gas pattern unremarkable without
evidence of obstruction or significant ileus. Expected stool burden
in the colon. Visible psoas margins. Mild degenerative changes
involving the lumbar spine.
IMPRESSION: No visible opaque urinary tract calculi. No acute abdominal
abnormality.

## 2016-01-11 ENCOUNTER — Encounter (INDEPENDENT_AMBULATORY_CARE_PROVIDER_SITE_OTHER): Payer: Self-pay

## 2016-01-29 ENCOUNTER — Encounter: Payer: Self-pay | Admitting: Urology

## 2016-01-29 ENCOUNTER — Ambulatory Visit (INDEPENDENT_AMBULATORY_CARE_PROVIDER_SITE_OTHER): Payer: Medicare Other | Admitting: Urology

## 2016-01-29 VITALS — BP 150/85 | HR 59 | Ht 68.0 in | Wt 196.4 lb

## 2016-01-29 DIAGNOSIS — R972 Elevated prostate specific antigen [PSA]: Secondary | ICD-10-CM | POA: Diagnosis not present

## 2016-01-29 NOTE — Progress Notes (Signed)
01/29/2016 9:42 AM   Percell Miller [name] Feb 23, 1951 [id number]  Referring provider: Guadalupe Maple, MD [address] [place], [place] [address]  Chief Complaint  Patient presents with  . Elevated PSA    HPI: 65 year old male presents today for follow-up of elevated PSA. Patient has a long history of elevated PSA, was followed initially by Dr. Jeanette Caprice for many years. His PSA has ranged between 3 for cord patient. He has had 2 negative prostate biopsies in the past. Last year he was seen one year prior and his PSA was 3.8. Today's PSA is pending.  Patient denies any progressive voiding symptoms including frequency, urgency, or dysuria. Has had no urinary tract infections or hematuria over the past year. IPSS: 3, Qo L 1  The patient states that his erections Fabio Bering, he is not imaged using the medications at this time, he had a bad reaction initially and is reluctant to try to gain.    Addition, the patient has a history of Nephrolithiasis. He denies flank pain or the passage of any stones over the past year.  The patient had a KUB performed in 2015 which showed no calcifications or evidence of kidney stones.  PMH: Past Medical History:  Diagnosis Date  . Hyperlipidemia   . Hypertension     Surgical History: Past Surgical History:  Procedure Laterality Date  . LITHOTRIPSY  09/1998 & 07/29/1999   Due to nephrolithiasis  . PROSTATE SURGERY     Biopsy= Negative  . TONSILLECTOMY      Home Medications:    Medication List       Accurate as of 01/29/16  9:42 AM. Always use your most recent med list.          amLODipine 5 MG tablet Commonly known as:  NORVASC Take 1 tablet (5 mg total) by mouth daily.   aspirin EC 81 MG tablet Take 81 mg by mouth daily.   fexofenadine 180 MG tablet Commonly known as:  ALLEGRA Take 180 mg by mouth daily. Reported on 08/07/2015   fluticasone 50 MCG/ACT nasal spray Commonly known as:  FLONASE Place 2 sprays into both nostrils daily.   hydrocortisone 2.5 % rectal cream Commonly known as:  ANUSOL-HC Place 1 application rectally daily as needed for hemorrhoids or itching.   levothyroxine 50 MCG tablet Commonly known as:  SYNTHROID, LEVOTHROID TAKE 1 TABLET BY MOUTH DAILY   losartan-hydrochlorothiazide 100-12.5 MG tablet Commonly known as:  HYZAAR Take 1 tablet by mouth daily.   omeprazole 40 MG capsule Commonly known as:  PRILOSEC Take 1 capsule (40 mg total) by mouth daily.       Allergies:  Allergies  Allergen Reactions  . Penicillins     Family History: Family History  Problem Relation Age of Onset  . Stroke Father   . Cancer Sister     Pancreatic Cancer    Social History:  reports that he quit smoking about 36 years ago. His smoking use included Cigarettes. His smokeless tobacco use includes Chew. He reports that he drinks about 1.8 - 2.4 oz of alcohol per week . He reports that he does not use drugs.  ROS: UROLOGY Frequent Urination?: No Hard to postpone urination?: No Burning/pain with urination?: No Get up at night to urinate?: Yes Leakage of urine?: No Urine stream starts and stops?: No Trouble starting stream?: No Do you have to strain to urinate?: No Blood in urine?: No Urinary tract infection?: No Sexually transmitted disease?: No Injury to kidneys or  bladder?: No Painful intercourse?: No Weak stream?: No Erection problems?: No Penile pain?: No  Gastrointestinal Nausea?: No Vomiting?: No Indigestion/heartburn?: No Diarrhea?: No Constipation?: No  Constitutional Fever: No Night sweats?: No Weight loss?: No Fatigue?: No  Skin Skin rash/lesions?: No Itching?: No  Eyes Blurred vision?: No Double vision?: No  Ears/Nose/Throat Sore throat?: No Sinus problems?: Yes  Hematologic/Lymphatic Swollen glands?: No Easy bruising?: No  Cardiovascular Leg swelling?: No Chest pain?: No  Respiratory Cough?: No Shortness of breath?: No  Endocrine Excessive thirst?:  No  Musculoskeletal Back pain?: Yes Joint pain?: Yes  Neurological Headaches?: No Dizziness?: No  Psychologic Depression?: No Anxiety?: No  Physical Exam: BP (!) 150/85   Pulse (!) 59   Ht 5\' 8"  (1.727 m)   Wt 89.1 kg (196 lb 6.4 oz)   BMI 29.86 kg/m   Constitutional:  Alert and oriented, No acute distress. HEENT: [place] AT, moist mucus membranes.  Trachea midline, no masses. Cardiovascular: No clubbing, cyanosis, or edema. Respiratory: Normal respiratory effort, no increased work of breathing. GI: Abdomen is soft, nontender, nondistended, no abdominal masses DRE: Plus 1 prostate, smooth, symmetric, no nodules Skin: No rashes, bruises or suspicious lesions. Lymph: No cervical or inguinal adenopathy. Neurologic: Grossly intact, no focal deficits, moving all 4 extremities. Psychiatric: Normal mood and affect.  Laboratory Data: Lab Results  Component Value Date   WBC 6.6 01/31/2015   HCT 44.7 01/31/2015   MCV 93 01/31/2015   PLT 250 01/31/2015    Lab Results  Component Value Date   CREATININE 1.04 09/07/2015    No results found for: PSA  No results found for: TESTOSTERONE  No results found for: HGBA1C  Urinalysis    Component Value Date/Time   APPEARANCEUR Clear 02/01/2015 0931   GLUCOSEU Negative 02/01/2015 0931   BILIRUBINUR Negative 02/01/2015 0931   PROTEINUR Negative 02/01/2015 0931   NITRITE Negative 02/01/2015 0931   LEUKOCYTESUR Negative 02/01/2015 0931    Pertinent Imaging: I read the patient's report from his KUB in 2015 which demonstrated no evidence of nephrolithiasis.  Assessment & Plan:  The patient presents for annual prostate exam and PSA. He has a long history of elevated PSA with 2 prostate biopsies that were benign.  1. Elevated PSA The patient's rectal exam is reassuring. His PSA is pending. If his PSA is elevated, I would like to have the patient come back in 6 months with a PSA prior. If it remains stable, we will plan to follow-up  in one year. Patient needs no additional medications for his voiding symptoms or his erectile function. - PSA   Return in about 1 year (around 01/28/2017).  Ardis Hughs, [facility] [address] [place], [place] [address] [phone]

## 2016-01-30 LAB — PSA: Prostate Specific Ag, Serum: 4.4 ng/mL — ABNORMAL HIGH (ref 0.0–4.0)

## 2016-02-01 ENCOUNTER — Encounter: Payer: Self-pay | Admitting: Family Medicine

## 2016-02-01 ENCOUNTER — Ambulatory Visit (INDEPENDENT_AMBULATORY_CARE_PROVIDER_SITE_OTHER): Payer: Medicare Other | Admitting: Family Medicine

## 2016-02-01 VITALS — BP 154/95 | HR 60 | Temp 98.0°F | Ht 67.75 in | Wt 194.8 lb

## 2016-02-01 DIAGNOSIS — N200 Calculus of kidney: Secondary | ICD-10-CM | POA: Diagnosis not present

## 2016-02-01 DIAGNOSIS — Z0000 Encounter for general adult medical examination without abnormal findings: Secondary | ICD-10-CM | POA: Diagnosis not present

## 2016-02-01 DIAGNOSIS — J019 Acute sinusitis, unspecified: Secondary | ICD-10-CM | POA: Diagnosis not present

## 2016-02-01 DIAGNOSIS — E039 Hypothyroidism, unspecified: Secondary | ICD-10-CM | POA: Diagnosis not present

## 2016-02-01 DIAGNOSIS — E784 Other hyperlipidemia: Secondary | ICD-10-CM

## 2016-02-01 DIAGNOSIS — I10 Essential (primary) hypertension: Secondary | ICD-10-CM | POA: Diagnosis not present

## 2016-02-01 DIAGNOSIS — K2270 Barrett's esophagus without dysplasia: Secondary | ICD-10-CM | POA: Diagnosis not present

## 2016-02-01 DIAGNOSIS — K209 Esophagitis, unspecified without bleeding: Secondary | ICD-10-CM

## 2016-02-01 DIAGNOSIS — Z23 Encounter for immunization: Secondary | ICD-10-CM

## 2016-02-01 DIAGNOSIS — E7849 Other hyperlipidemia: Secondary | ICD-10-CM

## 2016-02-01 LAB — URINALYSIS, ROUTINE W REFLEX MICROSCOPIC
Bilirubin, UA: NEGATIVE
GLUCOSE, UA: NEGATIVE
Ketones, UA: NEGATIVE
Leukocytes, UA: NEGATIVE
Nitrite, UA: NEGATIVE
PROTEIN UA: NEGATIVE
RBC, UA: NEGATIVE
Specific Gravity, UA: 1.02 (ref 1.005–1.030)
UUROB: 0.2 mg/dL (ref 0.2–1.0)
pH, UA: 7 (ref 5.0–7.5)

## 2016-02-01 MED ORDER — LOSARTAN POTASSIUM-HCTZ 100-12.5 MG PO TABS: 1.0000 | ORAL_TABLET | Freq: Every day | ORAL | 4 refills | Status: DC

## 2016-02-01 MED ORDER — OXYCODONE-ACETAMINOPHEN 5-325 MG PO TABS: 1.0000 | ORAL_TABLET | Freq: Three times a day (TID) | ORAL | 0 refills | Status: DC | PRN

## 2016-02-01 MED ORDER — OMEPRAZOLE 40 MG PO CPDR: 40.0000 mg | DELAYED_RELEASE_CAPSULE | Freq: Every day | ORAL | 4 refills | Status: DC

## 2016-02-01 MED ORDER — AZITHROMYCIN 250 MG PO TABS: ORAL_TABLET | ORAL | 2 refills | Status: DC

## 2016-02-01 MED ORDER — LEVOTHYROXINE SODIUM 50 MCG PO TABS: 50.0000 ug | ORAL_TABLET | Freq: Every day | ORAL | 4 refills | Status: DC

## 2016-02-01 MED ORDER — AMLODIPINE BESYLATE 5 MG PO TABS: 5.0000 mg | ORAL_TABLET | Freq: Every day | ORAL | 4 refills | Status: DC

## 2016-02-01 NOTE — Progress Notes (Signed)
BP (!) 154/95 (BP Location: Left Arm, Patient Position: Sitting, Cuff Size: Normal)   Pulse 60   Temp 98 F (36.7 C)   Ht 5' 7.75" (1.721 m)   Wt 194 lb 12.8 oz (88.4 kg)   SpO2 99%   BMI 29.84 kg/m    Subjective:    Patient ID: Arthur Richardson, male    DOB: [birth year], 66 y.o.   MRN: [id number]  HPI: Arthur Richardson is a 65 y.o. male  Chief Complaint  Patient presents with  . Annual Exam   For about 2 weeks with marked sinus congestion and facial Pressure drainage and sore throat especially sensitive on the right side 2 cold. No fever or chills is likely has been treated with Tylenol  Patient taking blood pressure medicine faithfully blood pressures been high here and at other doctor's offices no symptoms from hypertension Reflux doing okay with omeprazole Also thyroid doing well no complaints from medications taken faithfully every day. Relevant past medical, surgical, family and social history reviewed and updated as indicated. Interim medical history since our last visit reviewed. Allergies and medications reviewed and updated.  Review of Systems  Constitutional: Positive for chills, diaphoresis, fatigue and fever.  HENT: Positive for congestion, postnasal drip, rhinorrhea, sinus pressure and sore throat.   Eyes: Negative.   Respiratory: Negative.  Negative for cough.   Cardiovascular: Negative.   Gastrointestinal: Negative.   Endocrine: Negative.   Genitourinary: Negative.   Musculoskeletal: Negative.   Skin: Negative.   Allergic/Immunologic: Negative.   Neurological: Negative.   Hematological: Negative.   Psychiatric/Behavioral: Negative.     Per HPI unless specifically indicated above     Objective:    BP (!) 154/95 (BP Location: Left Arm, Patient Position: Sitting, Cuff Size: Normal)   Pulse 60   Temp 98 F (36.7 C)   Ht 5' 7.75" (1.721 m)   Wt 194 lb 12.8 oz (88.4 kg)   SpO2 99%   BMI 29.84 kg/m   Wt Readings from Last 3 Encounters:    02/01/16 194 lb 12.8 oz (88.4 kg)  01/29/16 196 lb 6.4 oz (89.1 kg)  08/07/15 195 lb (88.5 kg)    Physical Exam  Constitutional: He is oriented to person, place, and time. He appears well-developed and well-nourished.  HENT:  Head: Normocephalic and atraumatic.  Right Ear: External ear normal.  Left Ear: External ear normal.  Mouth/Throat: Oropharyngeal exudate present.  Eyes: Conjunctivae and EOM are normal. Pupils are equal, round, and reactive to light.  Neck: Normal range of motion. Neck supple. No thyromegaly present.  Cardiovascular: Normal rate, regular rhythm, normal heart sounds and intact distal pulses.   Pulmonary/Chest: Effort normal and breath sounds normal.  Abdominal: Soft. Bowel sounds are normal. There is no splenomegaly or hepatomegaly.  Genitourinary: Rectum normal, prostate normal and penis normal.  Musculoskeletal: Normal range of motion.  Lymphadenopathy:    He has cervical adenopathy.  Neurological: He is alert and oriented to person, place, and time. He has normal reflexes.  Skin: No rash noted. No erythema.  Psychiatric: He has a normal mood and affect. His behavior is normal. Judgment and thought content normal.    Results for orders placed or performed in visit on 01/29/16  PSA  Result Value Ref Range   Prostate Specific Ag, Serum 4.4 (H) 0.0 - 4.0 ng/mL      Assessment & Plan:   Problem List Items Addressed This Visit      Cardiovascular and Mediastinum  Hypertension    Discuss hypertension with patient poor control patient's blood pressure goes up and sinuses are bad which they are now will treat sinusitis and if blood pressure not better will address blood pressure concerns.      Relevant Medications   losartan-hydrochlorothiazide (HYZAAR) 100-12.5 MG tablet   amLODipine (NORVASC) 5 MG tablet     Digestive   Barrett's esophagus with esophagitis    The current medical regimen is effective;  continue present plan and medications.          Endocrine   Hypothyroidism    The current medical regimen is effective;  continue present plan and medications.       Relevant Medications   levothyroxine (SYNTHROID, LEVOTHROID) 50 MCG tablet     Genitourinary   Nephrolithiasis    Patient needs fresh refill for emergency use for 4 tablets of Percocet      Relevant Medications   oxyCODONE-acetaminophen (ROXICET) 5-325 MG tablet    Other Visit Diagnoses    Need for influenza vaccination    -  Primary   Relevant Orders   Flu vaccine HIGH DOSE PF (Fluzone High dose)   Annual physical exam       Relevant Orders   CBC with Differential/Platelet   Comprehensive metabolic panel   Lipid panel   PSA   TSH   Urinalysis, Routine w reflex microscopic (not at [facility])   Acute sinusitis, recurrence not specified, unspecified location       Relevant Medications   azithromycin (ZITHROMAX) 250 MG tablet       Follow up plan: Return in about 6 months (around 08/01/2016) for BMP.

## 2016-02-01 NOTE — Assessment & Plan Note (Signed)
Discuss hypertension with patient poor control patient's blood pressure goes up and sinuses are bad which they are now will treat sinusitis and if blood pressure not better will address blood pressure concerns.

## 2016-02-01 NOTE — Assessment & Plan Note (Signed)
The current medical regimen is effective;  continue present plan and medications.  

## 2016-02-01 NOTE — Assessment & Plan Note (Signed)
Patient needs fresh refill for emergency use for 4 tablets of Percocet

## 2016-02-02 ENCOUNTER — Telehealth: Payer: Self-pay

## 2016-02-02 LAB — CBC WITH DIFFERENTIAL/PLATELET
BASOS ABS: 0.1 10*3/uL (ref 0.0–0.2)
BASOS: 1 %
EOS (ABSOLUTE): 0.2 10*3/uL (ref 0.0–0.4)
Eos: 3 %
HEMOGLOBIN: 15.1 g/dL (ref 12.6–17.7)
Hematocrit: 43 % (ref 37.5–51.0)
IMMATURE GRANS (ABS): 0 10*3/uL (ref 0.0–0.1)
Immature Granulocytes: 0 %
LYMPHS ABS: 2.3 10*3/uL (ref 0.7–3.1)
LYMPHS: 40 %
MCH: 32.1 pg (ref 26.6–33.0)
MCHC: 35.1 g/dL (ref 31.5–35.7)
MCV: 91 fL (ref 79–97)
MONOCYTES: 11 %
Monocytes Absolute: 0.6 10*3/uL (ref 0.1–0.9)
Neutrophils Absolute: 2.5 10*3/uL (ref 1.4–7.0)
Neutrophils: 45 %
Platelets: 294 10*3/uL (ref 150–379)
RBC: 4.71 x10E6/uL (ref 4.14–5.80)
RDW: 13 % (ref 12.3–15.4)
WBC: 5.6 10*3/uL (ref 3.4–10.8)

## 2016-02-02 LAB — LIPID PANEL
CHOLESTEROL TOTAL: 187 mg/dL (ref 100–199)
Chol/HDL Ratio: 3.5 ratio units (ref 0.0–5.0)
HDL: 54 mg/dL (ref >39)
LDL Calculated: 116 mg/dL — ABNORMAL HIGH (ref 0–99)
Triglycerides: 85 mg/dL (ref 0–149)
VLDL Cholesterol Cal: 17 mg/dL (ref 5–40)

## 2016-02-02 LAB — COMPREHENSIVE METABOLIC PANEL
ALK PHOS: 101 IU/L (ref 39–117)
ALT: 59 IU/L — ABNORMAL HIGH (ref 0–44)
AST: 41 IU/L — ABNORMAL HIGH (ref 0–40)
Albumin/Globulin Ratio: 1.8 (ref 1.2–2.2)
Albumin: 4.8 g/dL (ref 3.6–4.8)
BILIRUBIN TOTAL: 0.4 mg/dL (ref 0.0–1.2)
BUN/Creatinine Ratio: 14 (ref 10–24)
BUN: 14 mg/dL (ref 8–27)
CHLORIDE: 96 mmol/L (ref 96–106)
CO2: 23 mmol/L (ref 18–29)
Calcium: 10.2 mg/dL (ref 8.6–10.2)
Creatinine, Ser: 0.98 mg/dL (ref 0.76–1.27)
GFR calc Af Amer: 92 mL/min/{1.73_m2} (ref >59)
GFR calc non Af Amer: 79 mL/min/{1.73_m2} (ref >59)
Globulin, Total: 2.7 g/dL (ref 1.5–4.5)
Glucose: 98 mg/dL (ref 65–99)
Potassium: 4.9 mmol/L (ref 3.5–5.2)
SODIUM: 138 mmol/L (ref 134–144)
Total Protein: 7.5 g/dL (ref 6.0–8.5)

## 2016-02-02 LAB — PSA: Prostate Specific Ag, Serum: 3.9 ng/mL (ref 0.0–4.0)

## 2016-02-02 LAB — TSH: TSH: 2.27 u[IU]/mL (ref 0.450–4.500)

## 2016-02-02 NOTE — Telephone Encounter (Signed)
-----   Message from Ardis Hughs, MD sent at 02/01/2016  5:22 AM EDT ----- PSA is slightly elevated compared to last year.  I do not recommend any additional tests/treatment at this point.  Follow-up in one year with repeat PSA prior.  -Dr. Lemmie Evens

## 2016-02-02 NOTE — Telephone Encounter (Signed)
Spoke with pt in reference to PSA results. Pt voiced understanding.  

## 2016-02-05 ENCOUNTER — Encounter: Payer: Self-pay | Admitting: Family Medicine

## 2016-02-12 ENCOUNTER — Ambulatory Visit: Payer: Medicare Other

## 2016-02-13 ENCOUNTER — Other Ambulatory Visit: Payer: Self-pay | Admitting: Family Medicine

## 2016-03-14 ENCOUNTER — Encounter: Payer: Self-pay | Admitting: Family Medicine

## 2016-03-14 ENCOUNTER — Ambulatory Visit (INDEPENDENT_AMBULATORY_CARE_PROVIDER_SITE_OTHER): Payer: Medicare Other | Admitting: Family Medicine

## 2016-03-14 VITALS — BP 133/67 | HR 70 | Temp 97.6°F | Wt 197.8 lb

## 2016-03-14 DIAGNOSIS — I10 Essential (primary) hypertension: Secondary | ICD-10-CM

## 2016-03-14 MED ORDER — LOSARTAN POTASSIUM-HCTZ 100-25 MG PO TABS: 1.0000 | ORAL_TABLET | Freq: Every day | ORAL | 4 refills | Status: DC

## 2016-03-14 NOTE — Assessment & Plan Note (Signed)
Hypertension good control though with side effects and systemic effects will discontinue amlodipine 10 mg drop to 5 mg. Increase losartan HCT 100/12.5 to 100/25 Patient will monitor blood pressure if not doing well still having side effects will notify us.

## 2016-03-14 NOTE — Progress Notes (Signed)
BP 133/67 (BP Location: Left Arm, Patient Position: Sitting, Cuff Size: Normal)   Pulse 70   Temp 97.6 F (36.4 C)   Wt 197 lb 12.8 oz (89.7 kg)   SpO2 98%   BMI 30.30 kg/m    Subjective:    Patient ID: Arthur Richardson, male    DOB: [birth year], 65 y.o.   MRN: [id number]  HPI: Arthur Richardson is a 65 y.o. male  Chief Complaint  Patient presents with  . Hypertension   Patient checking blood pressure has remained high increased amlodipine to 10 mg blood pressure was better but developed marked swelling of lower legs and ankles. Developed nocturia and frequency. With just not feeling good all over. Has continued other medicines losartan HCT No PND orthopnea or shortness of breath symptoms Relevant past medical, surgical, family and social history reviewed and updated as indicated. Interim medical history since our last visit reviewed. Allergies and medications reviewed and updated.  Review of Systems  Constitutional: Negative.   Respiratory: Negative.   Cardiovascular: Negative.     Per HPI unless specifically indicated above     Objective:    BP 133/67 (BP Location: Left Arm, Patient Position: Sitting, Cuff Size: Normal)   Pulse 70   Temp 97.6 F (36.4 C)   Wt 197 lb 12.8 oz (89.7 kg)   SpO2 98%   BMI 30.30 kg/m   Wt Readings from Last 3 Encounters:  03/14/16 197 lb 12.8 oz (89.7 kg)  02/01/16 194 lb 12.8 oz (88.4 kg)  01/29/16 196 lb 6.4 oz (89.1 kg)    Physical Exam  Constitutional: He is oriented to person, place, and time. He appears well-developed and well-nourished. No distress.  HENT:  Head: Normocephalic and atraumatic.  Right Ear: Hearing normal.  Left Ear: Hearing normal.  Nose: Nose normal.  Eyes: Conjunctivae and lids are normal. Right eye exhibits no discharge. Left eye exhibits no discharge. No scleral icterus.  Cardiovascular: Normal rate, regular rhythm and normal heart sounds.   Pulmonary/Chest: Effort normal and breath sounds  normal. No respiratory distress.  Musculoskeletal: Normal range of motion.  Lower legs with 3+ edema  Neurological: He is alert and oriented to person, place, and time.  Skin: Skin is intact. No rash noted.  Psychiatric: He has a normal mood and affect. His speech is normal and behavior is normal. Judgment and thought content normal. Cognition and memory are normal.    Results for orders placed or performed in visit on 02/01/16  CBC with Differential/Platelet  Result Value Ref Range   WBC 5.6 3.4 - 10.8 x10E3/uL   RBC 4.71 4.14 - 5.80 x10E6/uL   Hemoglobin 15.1 12.6 - 17.7 g/dL   Hematocrit 43.0 37.5 - 51.0 %   MCV 91 79 - 97 fL   MCH 32.1 26.6 - 33.0 pg   MCHC 35.1 31.5 - 35.7 g/dL   RDW 13.0 12.3 - 15.4 %   Platelets 294 150 - 379 x10E3/uL   Neutrophils 45 Not Estab. %   Lymphs 40 Not Estab. %   Monocytes 11 Not Estab. %   Eos 3 Not Estab. %   Basos 1 Not Estab. %   Neutrophils Absolute 2.5 1.4 - 7.0 x10E3/uL   Lymphocytes Absolute 2.3 0.7 - 3.1 x10E3/uL   Monocytes Absolute 0.6 0.1 - 0.9 x10E3/uL   EOS (ABSOLUTE) 0.2 0.0 - 0.4 x10E3/uL   Basophils Absolute 0.1 0.0 - 0.2 x10E3/uL   Immature Granulocytes 0 Not Estab. %  Immature Grans (Abs) 0.0 0.0 - 0.1 x10E3/uL  Comprehensive metabolic panel  Result Value Ref Range   Glucose 98 65 - 99 mg/dL   BUN 14 8 - 27 mg/dL   Creatinine, Ser 0.98 0.76 - 1.27 mg/dL   GFR calc non Af Amer 79 >59 mL/min/1.73   GFR calc Af Amer 92 >59 mL/min/1.73   BUN/Creatinine Ratio 14 10 - 24   Sodium 138 134 - 144 mmol/L   Potassium 4.9 3.5 - 5.2 mmol/L   Chloride 96 96 - 106 mmol/L   CO2 23 18 - 29 mmol/L   Calcium 10.2 8.6 - 10.2 mg/dL   Total Protein 7.5 6.0 - 8.5 g/dL   Albumin 4.8 3.6 - 4.8 g/dL   Globulin, Total 2.7 1.5 - 4.5 g/dL   Albumin/Globulin Ratio 1.8 1.2 - 2.2   Bilirubin Total 0.4 0.0 - 1.2 mg/dL   Alkaline Phosphatase 101 39 - 117 IU/L   AST 41 (H) 0 - 40 IU/L   ALT 59 (H) 0 - 44 IU/L  Lipid panel  Result Value Ref  Range   Cholesterol, Total 187 100 - 199 mg/dL   Triglycerides 85 0 - 149 mg/dL   HDL 54 >39 mg/dL   VLDL Cholesterol Cal 17 5 - 40 mg/dL   LDL Calculated 116 (H) 0 - 99 mg/dL   Chol/HDL Ratio 3.5 0.0 - 5.0 ratio units  PSA  Result Value Ref Range   Prostate Specific Ag, Serum 3.9 0.0 - 4.0 ng/mL  TSH  Result Value Ref Range   TSH 2.270 0.450 - 4.500 uIU/mL  Urinalysis, Routine w reflex microscopic (not at [facility])  Result Value Ref Range   Specific Gravity, UA 1.020 1.005 - 1.030   pH, UA 7.0 5.0 - 7.5   Color, UA Yellow Yellow   Appearance Ur Clear Clear   Leukocytes, UA Negative Negative   Protein, UA Negative Negative/Trace   Glucose, UA Negative Negative   Ketones, UA Negative Negative   RBC, UA Negative Negative   Bilirubin, UA Negative Negative   Urobilinogen, Ur 0.2 0.2 - 1.0 mg/dL   Nitrite, UA Negative Negative      Assessment & Plan:   Problem List Items Addressed This Visit      Cardiovascular and Mediastinum   Hypertension - Primary    Hypertension good control though with side effects and systemic effects will discontinue amlodipine 10 mg drop to 5 mg. Increase losartan HCT 100/12.5 to 100/25 Patient will monitor blood pressure if not doing well still having side effects will notify us.      Relevant Medications   losartan-hydrochlorothiazide (HYZAAR) 100-25 MG tablet       Follow up plan: Return in about 6 months (around 09/11/2016) for BMP.

## 2016-03-21 ENCOUNTER — Other Ambulatory Visit: Payer: Self-pay | Admitting: Family Medicine

## 2016-03-26 ENCOUNTER — Other Ambulatory Visit: Payer: Self-pay | Admitting: Family Medicine

## 2016-04-17 ENCOUNTER — Other Ambulatory Visit: Payer: Self-pay | Admitting: Family Medicine

## 2016-07-15 ENCOUNTER — Ambulatory Visit (INDEPENDENT_AMBULATORY_CARE_PROVIDER_SITE_OTHER): Payer: Medicare Other | Admitting: Family Medicine

## 2016-07-15 ENCOUNTER — Ambulatory Visit
Admission: RE | Admit: 2016-07-15 | Discharge: 2016-07-15 | Disposition: A | Discharge status: Home / Self Care | Payer: Medicare Other | Source: Ambulatory Visit | Attending: Family Medicine | Admitting: Family Medicine

## 2016-07-15 ENCOUNTER — Telehealth: Payer: Self-pay | Admitting: Family Medicine

## 2016-07-15 ENCOUNTER — Encounter: Payer: Self-pay | Admitting: Family Medicine

## 2016-07-15 VITALS — BP 159/91 | HR 67 | Temp 98.5°F | Wt 196.0 lb

## 2016-07-15 DIAGNOSIS — M160 Bilateral primary osteoarthritis of hip: Secondary | ICD-10-CM | POA: Insufficient documentation

## 2016-07-15 DIAGNOSIS — M25551 Pain in right hip: Secondary | ICD-10-CM

## 2016-07-15 DIAGNOSIS — M1611 Unilateral primary osteoarthritis, right hip: Secondary | ICD-10-CM | POA: Diagnosis not present

## 2016-07-15 NOTE — Telephone Encounter (Signed)
Patient notified of results. He would like to try a round of Prednisone.

## 2016-07-15 NOTE — Progress Notes (Signed)
BP (!) 159/91   Pulse 67   Temp 98.5 F (36.9 C)   Wt 196 lb (88.9 kg)   SpO2 98%   BMI 30.02 kg/m    Subjective:    Patient ID: Arthur Richardson, male    DOB: 07-Jan-1951, 66 y.o.   MRN: [id number]  HPI: Arthur Richardson is a 66 y.o. male  Chief Complaint  Patient presents with  . Hip Pain    right hip x 3 days. Throbbing pain. Isn't bad if he's not moving. Has to crawl occasionally. No burning.  Would like a MRI. Had injury approx 2 years ago, fell down stairs.    Hip pain Patient history of chronic right hip pain for 2 years after falling down stairs and hitting concrete presents with throbbing right hip pain worsening over the past 2 days that radiates to upper leg above the knee. Worse at night lying down, waking up in the morning and with walking. Relieved with rest. Denies fever/chills, bladder incontinence, dysuria, back pain, numbness/tingling, recent trauma or fall. Took Ibuprofen and Tylenol without relief. Patient has not had an x-ray of hip done.   Relevant past medical, surgical, family and social history reviewed and updated as indicated. Interim medical history since our last visit reviewed. Allergies and medications reviewed and updated.  Review of Systems  Constitutional: Negative for chills and fever.  Cardiovascular: Negative for leg swelling.  Genitourinary: Negative for difficulty urinating, dysuria, frequency and urgency.  Musculoskeletal: Positive for arthralgias. Negative for back pain, gait problem, joint swelling and myalgias.  Skin: Negative for rash.  Neurological: Negative for dizziness, weakness and numbness.   Per HPI unless specifically indicated above     Objective:    BP (!) 159/91   Pulse 67   Temp 98.5 F (36.9 C)   Wt 196 lb (88.9 kg)   SpO2 98%   BMI 30.02 kg/m   Wt Readings from Last 3 Encounters:  07/15/16 196 lb (88.9 kg)  03/14/16 197 lb 12.8 oz (89.7 kg)  02/01/16 194 lb 12.8 oz (88.4 kg)    Physical Exam    Constitutional: He is oriented to person, place, and time. He appears well-developed and well-nourished. No distress.  HENT:  Head: Normocephalic and atraumatic.  Right Ear: External ear normal.  Left Ear: External ear normal.  Eyes: Conjunctivae are normal. Right eye exhibits no discharge. Left eye exhibits no discharge.  Neck: Normal range of motion.  Pulmonary/Chest: Effort normal.  Abdominal: Soft.  Musculoskeletal: Normal range of motion.  Full ROM of back intact. Mild pain with hip flexion and extension. Relief with external hip rotation. No pain with internal hip rotation. Negative SLR. No edema, tenderness, swelling, deformity appreciated. 5/5 strength bilateral lower extremities.   Neurological: He is alert and oriented to person, place, and time.  Neurovascularly intact.  Skin: Skin is warm and dry.  Psychiatric: He has a normal mood and affect. His behavior is normal. Judgment and thought content normal.  Nursing note and vitals reviewed.     Assessment & Plan:   Problem List Items Addressed This Visit    None    Visit Diagnoses    Right hip pain    -  Primary   XR of hip ordered to evaluate for potential osteoarthritis. Results will be communicated with patient. Patient declined steroids or muscle relaxants today.   Relevant Orders   DG HIP UNILAT WITH PELVIS 2-3 VIEWS RIGHT       Follow up  plan: Return if symptoms worsen or fail to improve.

## 2016-07-15 NOTE — Patient Instructions (Signed)
Follow up as needed

## 2016-07-15 NOTE — Telephone Encounter (Signed)
X-ray shows signs of arthritis changes. As discussed, can take tylenol as needed for this. Also have the option of trying some prednisone to help ease this severe flare. Can try some muscle relaxers to help ease things up as well. Let me know what patient would like to do. Thanks

## 2016-07-16 DIAGNOSIS — M9901 Segmental and somatic dysfunction of cervical region: Secondary | ICD-10-CM | POA: Diagnosis not present

## 2016-07-16 DIAGNOSIS — M5136 Other intervertebral disc degeneration, lumbar region: Secondary | ICD-10-CM | POA: Diagnosis not present

## 2016-07-16 DIAGNOSIS — M5431 Sciatica, right side: Secondary | ICD-10-CM | POA: Diagnosis not present

## 2016-07-16 DIAGNOSIS — M9903 Segmental and somatic dysfunction of lumbar region: Secondary | ICD-10-CM | POA: Diagnosis not present

## 2016-07-16 DIAGNOSIS — M9905 Segmental and somatic dysfunction of pelvic region: Secondary | ICD-10-CM | POA: Diagnosis not present

## 2016-07-17 ENCOUNTER — Other Ambulatory Visit: Payer: Self-pay | Admitting: Family Medicine

## 2016-07-17 DIAGNOSIS — M9905 Segmental and somatic dysfunction of pelvic region: Secondary | ICD-10-CM | POA: Diagnosis not present

## 2016-07-17 DIAGNOSIS — M5136 Other intervertebral disc degeneration, lumbar region: Secondary | ICD-10-CM | POA: Diagnosis not present

## 2016-07-17 DIAGNOSIS — M9903 Segmental and somatic dysfunction of lumbar region: Secondary | ICD-10-CM | POA: Diagnosis not present

## 2016-07-17 DIAGNOSIS — M9901 Segmental and somatic dysfunction of cervical region: Secondary | ICD-10-CM | POA: Diagnosis not present

## 2016-07-17 DIAGNOSIS — M5431 Sciatica, right side: Secondary | ICD-10-CM | POA: Diagnosis not present

## 2016-07-17 MED ORDER — PREDNISONE 20 MG PO TABS: 40.0000 mg | ORAL_TABLET | Freq: Every day | ORAL | 0 refills | Status: DC

## 2016-07-17 NOTE — Telephone Encounter (Signed)
Prednisone printed and given to pt

## 2016-07-29 DIAGNOSIS — M539 Dorsopathy, unspecified: Secondary | ICD-10-CM | POA: Diagnosis not present

## 2016-08-02 ENCOUNTER — Other Ambulatory Visit: Payer: Self-pay | Admitting: Orthopedic Surgery

## 2016-08-02 DIAGNOSIS — M5136 Other intervertebral disc degeneration, lumbar region: Secondary | ICD-10-CM

## 2016-08-05 ENCOUNTER — Ambulatory Visit: Payer: Medicare Other | Admitting: Family Medicine

## 2016-08-07 ENCOUNTER — Ambulatory Visit
Admission: RE | Admit: 2016-08-07 | Discharge: 2016-08-07 | Disposition: A | Discharge status: Home / Self Care | Payer: Medicare Other | Source: Ambulatory Visit | Attending: Orthopedic Surgery | Admitting: Orthopedic Surgery

## 2016-08-07 DIAGNOSIS — M5126 Other intervertebral disc displacement, lumbar region: Secondary | ICD-10-CM | POA: Insufficient documentation

## 2016-08-07 DIAGNOSIS — M5136 Other intervertebral disc degeneration, lumbar region: Secondary | ICD-10-CM

## 2016-08-07 DIAGNOSIS — M47816 Spondylosis without myelopathy or radiculopathy, lumbar region: Secondary | ICD-10-CM | POA: Insufficient documentation

## 2016-08-08 DIAGNOSIS — M5416 Radiculopathy, lumbar region: Secondary | ICD-10-CM | POA: Diagnosis not present

## 2016-08-13 ENCOUNTER — Ambulatory Visit: Payer: Self-pay | Admitting: Family Medicine

## 2016-08-16 DIAGNOSIS — M5416 Radiculopathy, lumbar region: Secondary | ICD-10-CM | POA: Diagnosis not present

## 2016-08-16 DIAGNOSIS — M5126 Other intervertebral disc displacement, lumbar region: Secondary | ICD-10-CM | POA: Diagnosis not present

## 2016-08-21 ENCOUNTER — Encounter: Payer: Self-pay | Admitting: Family Medicine

## 2016-08-21 ENCOUNTER — Ambulatory Visit (INDEPENDENT_AMBULATORY_CARE_PROVIDER_SITE_OTHER): Payer: Medicare Other | Admitting: Family Medicine

## 2016-08-21 VITALS — BP 130/79 | HR 84 | Wt 189.0 lb

## 2016-08-21 DIAGNOSIS — I10 Essential (primary) hypertension: Secondary | ICD-10-CM | POA: Diagnosis not present

## 2016-08-21 DIAGNOSIS — E784 Other hyperlipidemia: Secondary | ICD-10-CM | POA: Diagnosis not present

## 2016-08-21 DIAGNOSIS — M51369 Other intervertebral disc degeneration, lumbar region without mention of lumbar back pain or lower extremity pain: Secondary | ICD-10-CM

## 2016-08-21 DIAGNOSIS — E7849 Other hyperlipidemia: Secondary | ICD-10-CM

## 2016-08-21 DIAGNOSIS — K209 Esophagitis, unspecified without bleeding: Secondary | ICD-10-CM

## 2016-08-21 DIAGNOSIS — M5136 Other intervertebral disc degeneration, lumbar region: Secondary | ICD-10-CM

## 2016-08-21 DIAGNOSIS — K2270 Barrett's esophagus without dysplasia: Secondary | ICD-10-CM | POA: Diagnosis not present

## 2016-08-21 DIAGNOSIS — E039 Hypothyroidism, unspecified: Secondary | ICD-10-CM | POA: Diagnosis not present

## 2016-08-21 NOTE — Assessment & Plan Note (Signed)
The current medical regimen is effective;  continue present plan and medications.  

## 2016-08-21 NOTE — Progress Notes (Signed)
BP 130/79   Pulse 84   Wt 189 lb (85.7 kg)   SpO2 99%   BMI 28.95 kg/m    Subjective:    Patient ID: Arthur Richardson, male    DOB: 06/29/1950, 66 y.o.   MRN: [id number]  HPI: Arthur Richardson is a 66 y.o. male  Chief Complaint  Patient presents with  . Follow-up  . Hypertension   Patient all in all doing pretty bad with bad right radicular symptoms into her right leg had shot on the 20th into his back which is helped a little bit. Patient taking thyroid medicines without problems. Patient taking blood pressure medicines with good control of blood pressure in spite of pain and problems and difficulty with work still having good blood pressure no side effects from medications taken faithfully. Reflux also doing well.  Relevant past medical, surgical, family and social history reviewed and updated as indicated. Interim medical history since our last visit reviewed. Allergies and medications reviewed and updated.  Review of Systems  Constitutional: Negative.   Respiratory: Negative.   Cardiovascular: Negative.     Per HPI unless specifically indicated above     Objective:    BP 130/79   Pulse 84   Wt 189 lb (85.7 kg)   SpO2 99%   BMI 28.95 kg/m   Wt Readings from Last 3 Encounters:  08/21/16 189 lb (85.7 kg)  07/15/16 196 lb (88.9 kg)  03/14/16 197 lb 12.8 oz (89.7 kg)    Physical Exam  Constitutional: He is oriented to person, place, and time. He appears well-developed and well-nourished.  HENT:  Head: Normocephalic and atraumatic.  Eyes: Conjunctivae and EOM are normal.  Neck: Normal range of motion.  Cardiovascular: Normal rate, regular rhythm and normal heart sounds.   Pulmonary/Chest: Effort normal and breath sounds normal.  Musculoskeletal: Normal range of motion.  Neurological: He is alert and oriented to person, place, and time.  Skin: No erythema.  Psychiatric: He has a normal mood and affect. His behavior is normal. Judgment and thought  content normal.    Results for orders placed or performed in visit on 02/01/16  CBC with Differential/Platelet  Result Value Ref Range   WBC 5.6 3.4 - 10.8 x10E3/uL   RBC 4.71 4.14 - 5.80 x10E6/uL   Hemoglobin 15.1 12.6 - 17.7 g/dL   Hematocrit 43.0 37.5 - 51.0 %   MCV 91 79 - 97 fL   MCH 32.1 26.6 - 33.0 pg   MCHC 35.1 31.5 - 35.7 g/dL   RDW 13.0 12.3 - 15.4 %   Platelets 294 150 - 379 x10E3/uL   Neutrophils 45 Not Estab. %   Lymphs 40 Not Estab. %   Monocytes 11 Not Estab. %   Eos 3 Not Estab. %   Basos 1 Not Estab. %   Neutrophils Absolute 2.5 1.4 - 7.0 x10E3/uL   Lymphocytes Absolute 2.3 0.7 - 3.1 x10E3/uL   Monocytes Absolute 0.6 0.1 - 0.9 x10E3/uL   EOS (ABSOLUTE) 0.2 0.0 - 0.4 x10E3/uL   Basophils Absolute 0.1 0.0 - 0.2 x10E3/uL   Immature Granulocytes 0 Not Estab. %   Immature Grans (Abs) 0.0 0.0 - 0.1 x10E3/uL  Comprehensive metabolic panel  Result Value Ref Range   Glucose 98 65 - 99 mg/dL   BUN 14 8 - 27 mg/dL   Creatinine, Ser 0.98 0.76 - 1.27 mg/dL   GFR calc non Af Amer 79 >59 mL/min/1.73   GFR calc Af Amer 92 >59  mL/min/1.73   BUN/Creatinine Ratio 14 10 - 24   Sodium 138 134 - 144 mmol/L   Potassium 4.9 3.5 - 5.2 mmol/L   Chloride 96 96 - 106 mmol/L   CO2 23 18 - 29 mmol/L   Calcium 10.2 8.6 - 10.2 mg/dL   Total Protein 7.5 6.0 - 8.5 g/dL   Albumin 4.8 3.6 - 4.8 g/dL   Globulin, Total 2.7 1.5 - 4.5 g/dL   Albumin/Globulin Ratio 1.8 1.2 - 2.2   Bilirubin Total 0.4 0.0 - 1.2 mg/dL   Alkaline Phosphatase 101 39 - 117 IU/L   AST 41 (H) 0 - 40 IU/L   ALT 59 (H) 0 - 44 IU/L  Lipid panel  Result Value Ref Range   Cholesterol, Total 187 100 - 199 mg/dL   Triglycerides 85 0 - 149 mg/dL   HDL 54 >39 mg/dL   VLDL Cholesterol Cal 17 5 - 40 mg/dL   LDL Calculated 116 (H) 0 - 99 mg/dL   Chol/HDL Ratio 3.5 0.0 - 5.0 ratio units  PSA  Result Value Ref Range   Prostate Specific Ag, Serum 3.9 0.0 - 4.0 ng/mL  TSH  Result Value Ref Range   TSH 2.270 0.450 -  4.500 uIU/mL  Urinalysis, Routine w reflex microscopic (not at [facility])  Result Value Ref Range   Specific Gravity, UA 1.020 1.005 - 1.030   pH, UA 7.0 5.0 - 7.5   Color, UA Yellow Yellow   Appearance Ur Clear Clear   Leukocytes, UA Negative Negative   Protein, UA Negative Negative/Trace   Glucose, UA Negative Negative   Ketones, UA Negative Negative   RBC, UA Negative Negative   Bilirubin, UA Negative Negative   Urobilinogen, Ur 0.2 0.2 - 1.0 mg/dL   Nitrite, UA Negative Negative      Assessment & Plan:   Problem List Items Addressed This Visit      Cardiovascular and Mediastinum   Hypertension - Primary    The current medical regimen is effective;  continue present plan and medications.       Relevant Orders   Basic metabolic panel     Digestive   Barrett's esophagus with esophagitis    The current medical regimen is effective;  continue present plan and medications.         Endocrine   Hypothyroidism    The current medical regimen is effective;  continue present plan and medications.         Musculoskeletal and Integument   DDD (degenerative disc disease), lumbar    The middle of getting treatment and shots patient will probably ultimately need surgery      Relevant Medications   cyclobenzaprine (FLEXERIL) 10 MG tablet   traMADol (ULTRAM) 50 MG tablet     Other   Hyperlipidemia    The current medical regimen is effective;  continue present plan and medications.       Relevant Orders   Basic metabolic panel       Follow up plan: Return in about 6 months (around 02/20/2017) for Physical Exam.

## 2016-08-21 NOTE — Assessment & Plan Note (Signed)
The middle of getting treatment and shots patient will probably ultimately need surgery

## 2016-08-22 ENCOUNTER — Encounter: Payer: Self-pay | Admitting: Family Medicine

## 2016-08-22 LAB — BASIC METABOLIC PANEL
BUN / CREAT RATIO: 16 (ref 10–24)
BUN: 17 mg/dL (ref 8–27)
CHLORIDE: 97 mmol/L (ref 96–106)
CO2: 26 mmol/L (ref 18–29)
Calcium: 9.7 mg/dL (ref 8.6–10.2)
Creatinine, Ser: 1.06 mg/dL (ref 0.76–1.27)
GFR calc non Af Amer: 72 mL/min/{1.73_m2} (ref >59)
GFR, EST AFRICAN AMERICAN: 84 mL/min/{1.73_m2} (ref >59)
Glucose: 86 mg/dL (ref 65–99)
Potassium: 4.3 mmol/L (ref 3.5–5.2)
SODIUM: 137 mmol/L (ref 134–144)

## 2016-09-03 ENCOUNTER — Encounter: Payer: Self-pay | Admitting: Family Medicine

## 2016-09-03 ENCOUNTER — Ambulatory Visit (INDEPENDENT_AMBULATORY_CARE_PROVIDER_SITE_OTHER): Payer: Medicare Other | Admitting: Family Medicine

## 2016-09-03 VITALS — BP 116/82 | HR 99 | Temp 99.0°F | Wt 188.5 lb

## 2016-09-03 DIAGNOSIS — R3914 Feeling of incomplete bladder emptying: Secondary | ICD-10-CM

## 2016-09-03 DIAGNOSIS — N401 Enlarged prostate with lower urinary tract symptoms: Secondary | ICD-10-CM

## 2016-09-03 DIAGNOSIS — M5441 Lumbago with sciatica, right side: Secondary | ICD-10-CM | POA: Diagnosis not present

## 2016-09-03 DIAGNOSIS — M2560 Stiffness of unspecified joint, not elsewhere classified: Secondary | ICD-10-CM | POA: Diagnosis not present

## 2016-09-03 DIAGNOSIS — R300 Dysuria: Secondary | ICD-10-CM | POA: Diagnosis not present

## 2016-09-03 DIAGNOSIS — M6281 Muscle weakness (generalized): Secondary | ICD-10-CM | POA: Diagnosis not present

## 2016-09-03 DIAGNOSIS — G8929 Other chronic pain: Secondary | ICD-10-CM | POA: Diagnosis not present

## 2016-09-03 MED ORDER — TAMSULOSIN HCL 0.4 MG PO CAPS: 0.4000 mg | ORAL_CAPSULE | Freq: Every day | ORAL | 3 refills | Status: DC

## 2016-09-03 NOTE — Progress Notes (Signed)
BP 116/82 (BP Location: Left Arm, Patient Position: Sitting, Cuff Size: Large)   Pulse 99   Temp 99 F (37.2 C)   Wt 188 lb 8 oz (85.5 kg)   SpO2 97%   BMI 28.87 kg/m    Subjective:    Patient ID: Arthur Richardson, male    DOB: Jan 16, 1951, 66 y.o.   MRN: [id number]  HPI: Arthur Richardson is a 66 y.o. male  Chief Complaint  Patient presents with  . Dysuria   URINARY SYMPTOMS Duration: 1+ weeks Dysuria: burning- when he first initiates his bladder Urinary frequency: yes Urgency: yes Small volume voids: yes Symptom severity: moderate Urinary incontinence: no Foul odor: no Hematuria: no Abdominal pain: no Back pain: no Suprapubic pain/pressure: yes Flank pain: no Fever:  no Vomiting: no Relief with cranberry juice: no Relief with pyridium: no Status: worse Previous urinary tract infection: no Recurrent urinary tract infection: no Penile discharge: no Treatments attempted: none   Relevant past medical, surgical, family and social history reviewed and updated as indicated. Interim medical history since our last visit reviewed. Allergies and medications reviewed and updated.  Review of Systems  Constitutional: Negative.   Respiratory: Negative.   Cardiovascular: Negative.   Gastrointestinal: Negative.   Genitourinary: Positive for decreased urine volume, dysuria, frequency and urgency. Negative for difficulty urinating, discharge, enuresis, flank pain, genital sores, hematuria, penile pain, penile swelling, scrotal swelling and testicular pain.  Psychiatric/Behavioral: Negative.     Per HPI unless specifically indicated above     Objective:    BP 116/82 (BP Location: Left Arm, Patient Position: Sitting, Cuff Size: Large)   Pulse 99   Temp 99 F (37.2 C)   Wt 188 lb 8 oz (85.5 kg)   SpO2 97%   BMI 28.87 kg/m   Wt Readings from Last 3 Encounters:  09/03/16 188 lb 8 oz (85.5 kg)  08/21/16 189 lb (85.7 kg)  07/15/16 196 lb (88.9 kg)    Physical  Exam  Constitutional: He is oriented to person, place, and time. He appears well-developed and well-nourished. No distress.  HENT:  Head: Normocephalic and atraumatic.  Right Ear: Hearing normal.  Left Ear: Hearing normal.  Nose: Nose normal.  Eyes: Conjunctivae and lids are normal. Right eye exhibits no discharge. Left eye exhibits no discharge. No scleral icterus.  Cardiovascular: Normal rate, regular rhythm, normal heart sounds and intact distal pulses.   Pulmonary/Chest: Effort normal. No respiratory distress.  Musculoskeletal: Normal range of motion.  Neurological: He is alert and oriented to person, place, and time.  Skin: Skin is intact. No rash noted.  Psychiatric: He has a normal mood and affect. His speech is normal and behavior is normal. Judgment and thought content normal. Cognition and memory are normal.  Nursing note and vitals reviewed.   Results for orders placed or performed in visit on 93/81/82  Basic metabolic panel  Result Value Ref Range   Glucose 86 65 - 99 mg/dL   BUN 17 8 - 27 mg/dL   Creatinine, Ser 1.06 0.76 - 1.27 mg/dL   GFR calc non Af Amer 72 >59 mL/min/1.73   GFR calc Af Amer 84 >59 mL/min/1.73   BUN/Creatinine Ratio 16 10 - 24   Sodium 137 134 - 144 mmol/L   Potassium 4.3 3.5 - 5.2 mmol/L   Chloride 97 96 - 106 mmol/L   CO2 26 18 - 29 mmol/L   Calcium 9.7 8.6 - 10.2 mg/dL      Assessment & Plan:  Problem List Items Addressed This Visit    None    Visit Diagnoses    Benign prostatic hyperplasia with incomplete bladder emptying    -  Primary   Will start flomax. Call if not getting better. Call if getting worse.    Relevant Medications   tamsulosin (FLOMAX) 0.4 MG CAPS capsule   Dysuria       UA negative   Relevant Orders   UA/M w/rflx Culture, Routine       Follow up plan: Return if symptoms worsen or fail to improve.

## 2016-09-03 NOTE — Patient Instructions (Addendum)
Benign Prostatic Hypertrophy The prostate gland is part of the reproductive system of men. A normal prostate is about the size and shape of a walnut. The prostate gland produces a fluid that is mixed with sperm to make semen. This gland surrounds the urethra and is located in front of the rectum and just below the bladder. The bladder is where urine is stored. The urethra is the tube through which urine passes from the bladder to get out of the body. The prostate grows as a man ages. An enlarged prostate not caused by cancer is called benign prostatic hypertrophy (BPH). An enlarged prostate can press on the urethra. This can make it harder to pass urine. In the early stages of enlargement, the bladder can get by with a narrowed urethra by forcing the urine through. If the problem gets worse, medical or surgical treatment may be required. This condition should be followed by your health care provider. The accumulation of urine in the bladder can cause infection. Back pressure and infection can progress to bladder damage and kidney (renal) failure. If needed, your health care provider may refer you to a specialist in kidney and prostate disease (urologist). What are the causes? BPH is a common health problem in men older than 50 years. This condition is a normal part of aging. However, not all men will develop problems from this condition. If the enlargement grows away from the urethra, then there will not be any compression of the urethra and resistance to urine flow.If the growth is toward the urethra and compresses it, you will experience difficulty urinating. What are the signs or symptoms?  Not able to completely empty your bladder.  Getting up often during the night to urinate.  Need to urinate frequently during the day.  Difficultly starting urine flow.  Decrease in size and strength of your urine stream.  Dribbling after urination.  Pain on urination (more common with  infection).  Inability to pass urine. This needs immediate treatment.  The development of a urinary tract infection. How is this diagnosed? These tests will help your health care provider understand your problem:  A thorough history and physical examination.  A urination history, with the number of times you urinate, the amounts of urine, the strength of the urine stream, and the feeling of emptiness or fullness after urinating.  A postvoid bladder scan that measures any amount of urine that may remain in your bladder after you finish urinating.  Digital rectal exam. In a rectal exam, your health care provider checks your prostate by putting a gloved, lubricated finger into your rectum to feel the back of your prostate gland. This exam detects the size of your gland and abnormal lumps or growths.  Exam of your urine (urinalysis).  Prostate specific antigen (PSA) screening. This is a blood test used to screen for prostate cancer.  Rectal ultrasonography. This test uses sound waves to electronically produce a picture of your prostate gland.  How is this treated? Once symptoms begin, your health care provider will monitor your condition. Of the men with this condition, one third will have symptoms that stabilize, one third will have symptoms that improve, and one third will have symptoms that progress in the first year. Mild symptoms may not need treatment. Simple observation and yearly exams may be all that is required. Medicines and surgery are options for more severe problems. Your health care provider can help you make an informed decision for what is best. Two classes of medicines   are available for relief of prostate symptoms:  Medicines that shrink the prostate. This helps relieve symptoms. These medicines take time to work, and it may be months before any improvement is seen. ? Uncommon side effects include problems with sexual function.  Medicines to relax the muscle of the  prostate. This also relieves the obstruction by reducing any compression on the urethra.This group of medicines work much faster than those that reduce the size of the prostate gland. Usually, one can experience improvement in days to weeks.. ? Side effects can include dizziness, fatigue, lightheadedness, and retrograde ejaculation (diminished volume of ejaculate).  Several types of surgical treatments are available for relief of prostate symptoms:  Transurethral resection of the prostate (TURP)-In this treatment, an instrument is inserted through opening at the tip of the penis. It is used to cut away pieces of the inner core of the prostate. The pieces are removed through the same opening of the penis. This removes the obstruction and helps get rid of the symptoms.  Transurethral incision (TUIP)-In this procedure, small cuts are made in the prostate. This lessens the prostates pressure on the urethra.  Transurethral microwave thermotherapy (TUMT)-This procedure uses microwaves to create heat. The heat destroys and removes a small amount of prostate tissue.  Transurethral needle ablation (TUNA)-This is a procedure that uses radio frequencies to do the same as TUMT.  Interstitial laser coagulation (ILC)-This is a procedure that uses a laser to do the same as TUMT and TUNA.  Transurethral electrovaporization (TUVP)-This is a procedure that uses electrodes to do the same as the procedures listed above.  Contact a health care provider if:  You develop a fever.  There is unexplained back pain.  Symptoms are not helped by medicines prescribed.  You develop side effects from the medicine you are taking.  Your urine becomes very dark or has a bad smell.  Your lower abdomen becomes distended and you have difficulty passing your urine. Get help right away if:  You are suddenly unable to urinate. This is an emergency. You should be seen immediately.  There are large amounts of blood or clots  in the urine.  Your urinary problems become unmanageable.  You develop lightheadedness, severe dizziness, or you feel faint.  You develop moderate to severe low back or flank pain.  You develop chills or fever. This information is not intended to replace advice given to you by your health care provider. Make sure you discuss any questions you have with your health care provider. Document Released: 04/15/2005 Document Revised: 09/27/2015 Document Reviewed: 10/29/2012 Elsevier Interactive Patient Education  2017 [employer]  

## 2016-09-04 LAB — MICROSCOPIC EXAMINATION
BACTERIA UA: NONE SEEN
RBC, UA: NONE SEEN /hpf (ref 0–?)

## 2016-09-04 LAB — UA/M W/RFLX CULTURE, ROUTINE
BILIRUBIN UA: NEGATIVE
Glucose, UA: NEGATIVE
Ketones, UA: NEGATIVE
LEUKOCYTES UA: NEGATIVE
Nitrite, UA: NEGATIVE
PH UA: 7 (ref 5.0–7.5)
PROTEIN UA: NEGATIVE
RBC UA: NEGATIVE
SPEC GRAV UA: 1.02 (ref 1.005–1.030)
Urobilinogen, Ur: 0.2 mg/dL (ref 0.2–1.0)

## 2016-09-05 DIAGNOSIS — M5441 Lumbago with sciatica, right side: Secondary | ICD-10-CM | POA: Diagnosis not present

## 2016-09-05 DIAGNOSIS — G8929 Other chronic pain: Secondary | ICD-10-CM | POA: Diagnosis not present

## 2016-09-10 DIAGNOSIS — G8929 Other chronic pain: Secondary | ICD-10-CM | POA: Diagnosis not present

## 2016-09-10 DIAGNOSIS — M5441 Lumbago with sciatica, right side: Secondary | ICD-10-CM | POA: Diagnosis not present

## 2016-09-13 DIAGNOSIS — Z1211 Encounter for screening for malignant neoplasm of colon: Secondary | ICD-10-CM | POA: Diagnosis not present

## 2016-09-13 DIAGNOSIS — K2270 Barrett's esophagus without dysplasia: Secondary | ICD-10-CM | POA: Diagnosis not present

## 2016-09-17 DIAGNOSIS — G8929 Other chronic pain: Secondary | ICD-10-CM | POA: Diagnosis not present

## 2016-09-17 DIAGNOSIS — M5441 Lumbago with sciatica, right side: Secondary | ICD-10-CM | POA: Diagnosis not present

## 2016-09-24 DIAGNOSIS — G8929 Other chronic pain: Secondary | ICD-10-CM | POA: Diagnosis not present

## 2016-09-24 DIAGNOSIS — M5441 Lumbago with sciatica, right side: Secondary | ICD-10-CM | POA: Diagnosis not present

## 2016-10-01 DIAGNOSIS — G8929 Other chronic pain: Secondary | ICD-10-CM | POA: Diagnosis not present

## 2016-10-01 DIAGNOSIS — M5441 Lumbago with sciatica, right side: Secondary | ICD-10-CM | POA: Diagnosis not present

## 2016-10-08 DIAGNOSIS — G8929 Other chronic pain: Secondary | ICD-10-CM | POA: Diagnosis not present

## 2016-10-08 DIAGNOSIS — M5441 Lumbago with sciatica, right side: Secondary | ICD-10-CM | POA: Diagnosis not present

## 2016-10-15 DIAGNOSIS — G8929 Other chronic pain: Secondary | ICD-10-CM | POA: Diagnosis not present

## 2016-10-15 DIAGNOSIS — M5441 Lumbago with sciatica, right side: Secondary | ICD-10-CM | POA: Diagnosis not present

## 2016-10-22 DIAGNOSIS — G8929 Other chronic pain: Secondary | ICD-10-CM | POA: Diagnosis not present

## 2016-10-22 DIAGNOSIS — M5441 Lumbago with sciatica, right side: Secondary | ICD-10-CM | POA: Diagnosis not present

## 2016-11-05 DIAGNOSIS — G8929 Other chronic pain: Secondary | ICD-10-CM | POA: Diagnosis not present

## 2016-11-05 DIAGNOSIS — M5441 Lumbago with sciatica, right side: Secondary | ICD-10-CM | POA: Diagnosis not present

## 2016-11-19 DIAGNOSIS — G8929 Other chronic pain: Secondary | ICD-10-CM | POA: Diagnosis not present

## 2016-11-19 DIAGNOSIS — M5441 Lumbago with sciatica, right side: Secondary | ICD-10-CM | POA: Diagnosis not present

## 2016-11-20 DIAGNOSIS — M5126 Other intervertebral disc displacement, lumbar region: Secondary | ICD-10-CM | POA: Diagnosis not present

## 2016-11-20 DIAGNOSIS — M5416 Radiculopathy, lumbar region: Secondary | ICD-10-CM | POA: Diagnosis not present

## 2016-11-26 DIAGNOSIS — G8929 Other chronic pain: Secondary | ICD-10-CM | POA: Diagnosis not present

## 2016-11-26 DIAGNOSIS — M5441 Lumbago with sciatica, right side: Secondary | ICD-10-CM | POA: Diagnosis not present

## 2016-12-25 ENCOUNTER — Other Ambulatory Visit: Payer: Self-pay | Admitting: Family Medicine

## 2016-12-27 ENCOUNTER — Other Ambulatory Visit: Payer: Self-pay

## 2016-12-27 MED ORDER — LOSARTAN POTASSIUM-HCTZ 100-25 MG PO TABS: 1.0000 | ORAL_TABLET | Freq: Every day | ORAL | 0 refills | Status: DC

## 2016-12-27 MED ORDER — AMLODIPINE BESYLATE 5 MG PO TABS: 5.0000 mg | ORAL_TABLET | Freq: Every day | ORAL | 0 refills | Status: DC

## 2016-12-27 MED ORDER — OMEPRAZOLE 40 MG PO CPDR: 40.0000 mg | DELAYED_RELEASE_CAPSULE | Freq: Every day | ORAL | 0 refills | Status: DC

## 2016-12-27 MED ORDER — TAMSULOSIN HCL 0.4 MG PO CAPS: 0.4000 mg | ORAL_CAPSULE | Freq: Every day | ORAL | 0 refills | Status: DC

## 2016-12-27 MED ORDER — LEVOTHYROXINE SODIUM 50 MCG PO TABS: 50.0000 ug | ORAL_TABLET | Freq: Every day | ORAL | 0 refills | Status: DC

## 2016-12-27 NOTE — Telephone Encounter (Signed)
Patient is now using [place] for his pharmacy and needs his medications sent there. He was last seen in May and has follow up in October.

## 2017-01-17 ENCOUNTER — Other Ambulatory Visit: Payer: Self-pay

## 2017-01-17 DIAGNOSIS — R972 Elevated prostate specific antigen [PSA]: Secondary | ICD-10-CM

## 2017-01-20 ENCOUNTER — Other Ambulatory Visit: Payer: Medicare Other

## 2017-01-20 DIAGNOSIS — R972 Elevated prostate specific antigen [PSA]: Secondary | ICD-10-CM

## 2017-01-21 LAB — PSA: Prostate Specific Ag, Serum: 3.4 ng/mL (ref 0.0–4.0)

## 2017-01-22 DIAGNOSIS — M5126 Other intervertebral disc displacement, lumbar region: Secondary | ICD-10-CM | POA: Diagnosis not present

## 2017-01-22 DIAGNOSIS — M5416 Radiculopathy, lumbar region: Secondary | ICD-10-CM | POA: Diagnosis not present

## 2017-01-23 ENCOUNTER — Telehealth: Payer: Self-pay

## 2017-01-23 NOTE — Telephone Encounter (Signed)
Arthur Hughs, MD  [name], LPN        Please let the patient know that his PSA is lower than last check.    Spoke with pt in reference to PSA results. Pt voiced understanding.

## 2017-01-27 ENCOUNTER — Ambulatory Visit (INDEPENDENT_AMBULATORY_CARE_PROVIDER_SITE_OTHER): Payer: Medicare Other | Admitting: Urology

## 2017-01-27 ENCOUNTER — Encounter: Payer: Self-pay | Admitting: Urology

## 2017-01-27 VITALS — BP 147/83 | HR 69

## 2017-01-27 DIAGNOSIS — R972 Elevated prostate specific antigen [PSA]: Secondary | ICD-10-CM | POA: Diagnosis not present

## 2017-01-27 MED ORDER — TAMSULOSIN HCL 0.4 MG PO CAPS: 0.4000 mg | ORAL_CAPSULE | Freq: Every day | ORAL | 11 refills | Status: DC

## 2017-01-27 NOTE — Progress Notes (Signed)
01/27/2017 9:34 AM   Arthur Richardson Aug 25, 1950 [id number]  Referring provider: Guadalupe Maple, MD [address] [place], [place] [address]  Chief Complaint  Patient presents with  . Elevated PSA    1year    HPI: Dr. Louis Meckel saw the patient in 2017 for an elevated PSA. He has had 2 negative biopsies. Last rectal examination was normal. His PSA one year ago was 4.4 and it decreased to 3.9. On 01/20/2017 it was 3.4  When he had a disc problem in April he was having trouble with constipation from pain medicine and his flow. Flomax helped beautifully. I think it continues to help him.  40 g benign prostate   PMH: Past Medical History:  Diagnosis Date  . Hyperlipidemia   . Hypertension     Surgical History: Past Surgical History:  Procedure Laterality Date  . LITHOTRIPSY  09/1998 & 07/29/1999   Due to nephrolithiasis  . PROSTATE SURGERY     Biopsy= Negative  . TONSILLECTOMY      Home Medications:  Allergies as of 01/27/2017      Reactions   Penicillins       Medication List       Accurate as of 01/27/17  9:34 AM. Always use your most recent med list.          amLODipine 5 MG tablet Commonly known as:  NORVASC Take 1 tablet (5 mg total) by mouth daily.   aspirin EC 81 MG tablet Take 81 mg by mouth daily.   levothyroxine 50 MCG tablet Commonly known as:  SYNTHROID, LEVOTHROID Take 1 tablet (50 mcg total) by mouth daily.   losartan-hydrochlorothiazide 100-25 MG tablet Commonly known as:  HYZAAR Take 1 tablet by mouth daily.   omeprazole 40 MG capsule Commonly known as:  PRILOSEC Take 1 capsule (40 mg total) by mouth daily.   tamsulosin 0.4 MG Caps capsule Commonly known as:  FLOMAX Take 1 capsule (0.4 mg total) by mouth daily.   traMADol 50 MG tablet Commonly known as:  ULTRAM Take by mouth.       Allergies:  Allergies  Allergen Reactions  . Penicillins     Family History: Family History  Problem Relation Age of Onset  . Stroke  Father   . Cancer Sister        Pancreatic Cancer    Social History:  reports that he quit smoking about 37 years ago. His smoking use included Cigarettes. His smokeless tobacco use includes Chew. He reports that he drinks about 12.6 oz of alcohol per week . He reports that he does not use drugs.  ROS: UROLOGY Frequent Urination?: No Hard to postpone urination?: No Burning/pain with urination?: No Get up at night to urinate?: No Leakage of urine?: No Urine stream starts and stops?: No Trouble starting stream?: No Do you have to strain to urinate?: No Blood in urine?: No Urinary tract infection?: No Sexually transmitted disease?: No Injury to kidneys or bladder?: No Painful intercourse?: No Weak stream?: No Erection problems?: No Penile pain?: No  Gastrointestinal Nausea?: No Vomiting?: No Indigestion/heartburn?: No Diarrhea?: No Constipation?: No  Constitutional Fever: No Night sweats?: No Weight loss?: No Fatigue?: No  Skin Skin rash/lesions?: No Itching?: No  Eyes Blurred vision?: No Double vision?: No  Ears/Nose/Throat Sore throat?: No Sinus problems?: No  Hematologic/Lymphatic Swollen glands?: No Easy bruising?: No  Cardiovascular Leg swelling?: No Chest pain?: No  Respiratory Cough?: No Shortness of breath?: No  Endocrine Excessive thirst?: No  Musculoskeletal Back pain?: Yes Joint pain?: No  Neurological Headaches?: No Dizziness?: No  Psychologic Depression?: No Anxiety?: No  Physical Exam: BP (!) 147/83   Pulse 69   Constitutional:  Alert and oriented, No acute distress.   Laboratory Data: Lab Results  Component Value Date   WBC 5.6 02/01/2016   HGB 15.1 02/01/2016   HCT 43.0 02/01/2016   MCV 91 02/01/2016   PLT 294 02/01/2016    Lab Results  Component Value Date   CREATININE 1.06 08/21/2016    No results found for: PSA  No results found for: TESTOSTERONE  No results found for: HGBA1C  Urinalysis      Component Value Date/Time   APPEARANCEUR Clear 09/03/2016 1339   GLUCOSEU Negative 09/03/2016 1339   BILIRUBINUR Negative 09/03/2016 1339   PROTEINUR Negative 09/03/2016 1339   NITRITE Negative 09/03/2016 1339   LEUKOCYTESUR Negative 09/03/2016 1339    Pertinent Imaging: none  Assessment & Plan:   the patient would like to be followed by his primaryhysician. Flomax renewed. Get PSA once a year. See when necessary  There are no diagnoses linked to this encounter.  No Follow-up on file.  Reece Packer, MD  [facility] [address], [place], [place] [address] [phone]

## 2017-02-03 ENCOUNTER — Other Ambulatory Visit: Payer: Self-pay | Admitting: Family Medicine

## 2017-02-14 ENCOUNTER — Other Ambulatory Visit: Payer: Self-pay | Admitting: Family Medicine

## 2017-02-17 ENCOUNTER — Other Ambulatory Visit: Payer: Self-pay | Admitting: Family Medicine

## 2017-02-20 ENCOUNTER — Encounter: Payer: Self-pay | Admitting: Family Medicine

## 2017-02-20 ENCOUNTER — Ambulatory Visit (INDEPENDENT_AMBULATORY_CARE_PROVIDER_SITE_OTHER): Payer: Medicare Other | Admitting: Family Medicine

## 2017-02-20 VITALS — BP 132/77 | HR 87 | Ht 68.9 in | Wt 199.0 lb

## 2017-02-20 DIAGNOSIS — Z7189 Other specified counseling: Secondary | ICD-10-CM | POA: Insufficient documentation

## 2017-02-20 DIAGNOSIS — K2270 Barrett's esophagus without dysplasia: Secondary | ICD-10-CM

## 2017-02-20 DIAGNOSIS — E039 Hypothyroidism, unspecified: Secondary | ICD-10-CM

## 2017-02-20 DIAGNOSIS — M5136 Other intervertebral disc degeneration, lumbar region: Secondary | ICD-10-CM

## 2017-02-20 DIAGNOSIS — I10 Essential (primary) hypertension: Secondary | ICD-10-CM | POA: Diagnosis not present

## 2017-02-20 DIAGNOSIS — E7849 Other hyperlipidemia: Secondary | ICD-10-CM | POA: Diagnosis not present

## 2017-02-20 DIAGNOSIS — K209 Esophagitis, unspecified: Secondary | ICD-10-CM

## 2017-02-20 DIAGNOSIS — Z0000 Encounter for general adult medical examination without abnormal findings: Secondary | ICD-10-CM

## 2017-02-20 DIAGNOSIS — N138 Other obstructive and reflux uropathy: Secondary | ICD-10-CM | POA: Diagnosis not present

## 2017-02-20 DIAGNOSIS — N401 Enlarged prostate with lower urinary tract symptoms: Secondary | ICD-10-CM

## 2017-02-20 MED ORDER — OMEPRAZOLE 40 MG PO CPDR: 40.0000 mg | DELAYED_RELEASE_CAPSULE | Freq: Every day | ORAL | 4 refills | Status: DC

## 2017-02-20 MED ORDER — LOSARTAN POTASSIUM-HCTZ 100-25 MG PO TABS: 1.0000 | ORAL_TABLET | Freq: Every day | ORAL | 4 refills | Status: DC

## 2017-02-20 MED ORDER — TAMSULOSIN HCL 0.4 MG PO CAPS: 0.4000 mg | ORAL_CAPSULE | Freq: Every day | ORAL | 4 refills | Status: DC

## 2017-02-20 MED ORDER — AMLODIPINE BESYLATE 5 MG PO TABS: 5.0000 mg | ORAL_TABLET | Freq: Every day | ORAL | 4 refills | Status: DC

## 2017-02-20 NOTE — Assessment & Plan Note (Signed)
The current medical regimen is effective;  continue present plan and medications.  

## 2017-02-20 NOTE — Assessment & Plan Note (Signed)
A voluntary discussion about advance care planning including the explanation and discussion of advance directives was extensively discussed  with the patient.  Explanation about the health care proxy and Living will was reviewed and packet with forms with explanation of how to fill them out was given.    

## 2017-02-20 NOTE — Assessment & Plan Note (Signed)
stable °

## 2017-02-20 NOTE — Assessment & Plan Note (Signed)
Having endoscopy next mo doing well

## 2017-02-20 NOTE — Assessment & Plan Note (Signed)
Doing well on tamulosin

## 2017-02-20 NOTE — Progress Notes (Signed)
BP 132/77   Pulse 87   Ht 5' 8.9" (1.75 m)   Wt 199 lb (90.3 kg)   SpO2 99%   BMI 29.47 kg/m    Subjective:    Patient ID: Arthur Richardson, male    DOB: [birth year], 66 y.o.   MRN: [id number]  HPI: Arthur Richardson is a 66 y.o. male  Chief Complaint  Patient presents with  . Annual Exam   Patient with continued lumbar disc symptoms significantly improved with injections from pain specialist but continued symptoms. Patient's able to work again with cautions. Patient's blood pressure doing well no complaints from medication. Taking thyroid without issues. Reflux stable. Cannulation working for BPH followed by urology and stable with declining PSA.  Relevant past medical, surgical, family and social history reviewed and updated as indicated. Interim medical history since our last visit reviewed. Allergies and medications reviewed and updated.  Review of Systems  Constitutional: Negative.   HENT: Negative.   Eyes: Negative.   Respiratory: Negative.   Cardiovascular: Negative.   Gastrointestinal: Negative.   Endocrine: Negative.   Genitourinary: Negative.   Musculoskeletal: Negative.   Skin: Negative.   Allergic/Immunologic: Negative.   Neurological: Negative.   Hematological: Negative.   Psychiatric/Behavioral: Negative.     Per HPI unless specifically indicated above     Objective:    BP 132/77   Pulse 87   Ht 5' 8.9" (1.75 m)   Wt 199 lb (90.3 kg)   SpO2 99%   BMI 29.47 kg/m   Wt Readings from Last 3 Encounters:  02/20/17 199 lb (90.3 kg)  09/03/16 188 lb 8 oz (85.5 kg)  08/21/16 189 lb (85.7 kg)    Physical Exam  Constitutional: He is oriented to person, place, and time. He appears well-developed and well-nourished.  HENT:  Head: Normocephalic.  Right Ear: External ear normal.  Left Ear: External ear normal.  Nose: Nose normal.  Eyes: Pupils are equal, round, and reactive to light. Conjunctivae and EOM are normal.  Neck: Normal range of  motion. Neck supple. No thyromegaly present.  Cardiovascular: Normal rate, regular rhythm, normal heart sounds and intact distal pulses.   Pulmonary/Chest: Effort normal and breath sounds normal.  Abdominal: Soft. Bowel sounds are normal. There is no splenomegaly or hepatomegaly.  Genitourinary:  Genitourinary Comments: Followed by urology  Musculoskeletal: Normal range of motion.  Lymphadenopathy:    He has no cervical adenopathy.  Neurological: He is alert and oriented to person, place, and time. He has normal reflexes.  Skin: Skin is warm and dry.  Psychiatric: He has a normal mood and affect. His behavior is normal. Judgment and thought content normal.    Results for orders placed or performed in visit on 01/20/17  PSA  Result Value Ref Range   Prostate Specific Ag, Serum 3.4 0.0 - 4.0 ng/mL      Assessment & Plan:   Problem List Items Addressed This Visit      Cardiovascular and Mediastinum   Hypertension - Primary    The current medical regimen is effective;  continue present plan and medications.       Relevant Medications   amLODipine (NORVASC) 5 MG tablet   losartan-hydrochlorothiazide (HYZAAR) 100-25 MG tablet   Other Relevant Orders   CBC with Differential/Platelet   Comprehensive metabolic panel     Digestive   Barrett's esophagus with esophagitis    Having endoscopy next mo doing well      Relevant Medications   omeprazole (PRILOSEC)  40 MG capsule   Other Relevant Orders   CBC with Differential/Platelet     Endocrine   Hypothyroidism    The current medical regimen is effective;  continue present plan and medications.       Relevant Orders   CBC with Differential/Platelet   TSH     Musculoskeletal and Integument   DDD (degenerative disc disease), lumbar    stable        Genitourinary   BPH with obstruction/lower urinary tract symptoms    Doing well on tamulosin      Relevant Medications   tamsulosin (FLOMAX) 0.4 MG CAPS capsule      Other   Hyperlipidemia    The current medical regimen is effective;  continue present plan and medications.       Relevant Medications   amLODipine (NORVASC) 5 MG tablet   losartan-hydrochlorothiazide (HYZAAR) 100-25 MG tablet   Other Relevant Orders   Lipid panel   Advanced care planning/counseling discussion    A voluntary discussion about advance care planning including the explanation and discussion of advance directives was extensively discussed  with the patient.  Explanation about the health care proxy and Living will was reviewed and packet with forms with explanation of how to fill them out was given.          Other Visit Diagnoses    PE (physical exam), annual           Follow up plan: Return in about 6 months (around 08/21/2017) for BMP.

## 2017-02-21 ENCOUNTER — Encounter: Payer: Self-pay | Admitting: Family Medicine

## 2017-02-21 LAB — COMPREHENSIVE METABOLIC PANEL
ALBUMIN: 4.7 g/dL (ref 3.6–4.8)
ALK PHOS: 117 IU/L (ref 39–117)
ALT: 42 IU/L (ref 0–44)
AST: 34 IU/L (ref 0–40)
Albumin/Globulin Ratio: 1.9 (ref 1.2–2.2)
BUN / CREAT RATIO: 18 (ref 10–24)
BUN: 18 mg/dL (ref 8–27)
Bilirubin Total: 0.6 mg/dL (ref 0.0–1.2)
CO2: 24 mmol/L (ref 20–29)
CREATININE: 0.98 mg/dL (ref 0.76–1.27)
Calcium: 10 mg/dL (ref 8.6–10.2)
Chloride: 98 mmol/L (ref 96–106)
GFR calc Af Amer: 91 mL/min/{1.73_m2} (ref >59)
GFR calc non Af Amer: 79 mL/min/{1.73_m2} (ref >59)
GLOBULIN, TOTAL: 2.5 g/dL (ref 1.5–4.5)
Glucose: 107 mg/dL — ABNORMAL HIGH (ref 65–99)
Potassium: 4.5 mmol/L (ref 3.5–5.2)
SODIUM: 138 mmol/L (ref 134–144)
Total Protein: 7.2 g/dL (ref 6.0–8.5)

## 2017-02-21 LAB — CBC WITH DIFFERENTIAL/PLATELET
Basophils Absolute: 0 10*3/uL (ref 0.0–0.2)
Basos: 0 %
EOS (ABSOLUTE): 0.2 10*3/uL (ref 0.0–0.4)
EOS: 3 %
HEMATOCRIT: 42.8 % (ref 37.5–51.0)
HEMOGLOBIN: 14.8 g/dL (ref 13.0–17.7)
Immature Grans (Abs): 0 10*3/uL (ref 0.0–0.1)
Immature Granulocytes: 0 %
LYMPHS ABS: 2.2 10*3/uL (ref 0.7–3.1)
Lymphs: 37 %
MCH: 32.8 pg (ref 26.6–33.0)
MCHC: 34.6 g/dL (ref 31.5–35.7)
MCV: 95 fL (ref 79–97)
MONOCYTES: 14 %
Monocytes Absolute: 0.8 10*3/uL (ref 0.1–0.9)
Neutrophils Absolute: 2.7 10*3/uL (ref 1.4–7.0)
Neutrophils: 46 %
Platelets: 272 10*3/uL (ref 150–379)
RBC: 4.51 x10E6/uL (ref 4.14–5.80)
RDW: 12.7 % (ref 12.3–15.4)
WBC: 5.9 10*3/uL (ref 3.4–10.8)

## 2017-02-21 LAB — TSH: TSH: 1.73 u[IU]/mL (ref 0.450–4.500)

## 2017-02-21 LAB — LIPID PANEL
CHOLESTEROL TOTAL: 203 mg/dL — AB (ref 100–199)
Chol/HDL Ratio: 3.2 ratio (ref 0.0–5.0)
HDL: 64 mg/dL (ref >39)
LDL CALC: 123 mg/dL — AB (ref 0–99)
Triglycerides: 82 mg/dL (ref 0–149)
VLDL Cholesterol Cal: 16 mg/dL (ref 5–40)

## 2017-03-18 DIAGNOSIS — D122 Benign neoplasm of ascending colon: Secondary | ICD-10-CM | POA: Diagnosis not present

## 2017-03-18 DIAGNOSIS — K2270 Barrett's esophagus without dysplasia: Secondary | ICD-10-CM | POA: Diagnosis not present

## 2017-03-18 DIAGNOSIS — D126 Benign neoplasm of colon, unspecified: Secondary | ICD-10-CM | POA: Diagnosis not present

## 2017-03-18 DIAGNOSIS — K620 Anal polyp: Secondary | ICD-10-CM | POA: Diagnosis not present

## 2017-03-18 DIAGNOSIS — D128 Benign neoplasm of rectum: Secondary | ICD-10-CM | POA: Diagnosis not present

## 2017-03-18 DIAGNOSIS — K621 Rectal polyp: Secondary | ICD-10-CM | POA: Diagnosis not present

## 2017-03-18 DIAGNOSIS — K210 Gastro-esophageal reflux disease with esophagitis: Secondary | ICD-10-CM | POA: Diagnosis not present

## 2017-03-18 DIAGNOSIS — K2950 Unspecified chronic gastritis without bleeding: Secondary | ICD-10-CM | POA: Diagnosis not present

## 2017-03-18 DIAGNOSIS — Z1211 Encounter for screening for malignant neoplasm of colon: Secondary | ICD-10-CM | POA: Diagnosis not present

## 2017-03-18 DIAGNOSIS — K259 Gastric ulcer, unspecified as acute or chronic, without hemorrhage or perforation: Secondary | ICD-10-CM | POA: Diagnosis not present

## 2017-03-18 DIAGNOSIS — K2960 Other gastritis without bleeding: Secondary | ICD-10-CM | POA: Diagnosis not present

## 2017-03-18 DIAGNOSIS — D123 Benign neoplasm of transverse colon: Secondary | ICD-10-CM | POA: Diagnosis not present

## 2017-03-18 LAB — HM COLONOSCOPY

## 2017-08-21 ENCOUNTER — Encounter: Payer: Self-pay | Admitting: Family Medicine

## 2017-08-21 ENCOUNTER — Ambulatory Visit (INDEPENDENT_AMBULATORY_CARE_PROVIDER_SITE_OTHER): Payer: Medicare Other | Admitting: Family Medicine

## 2017-08-21 VITALS — BP 146/87 | HR 60 | Ht 69.0 in | Wt 198.7 lb

## 2017-08-21 DIAGNOSIS — M25512 Pain in left shoulder: Secondary | ICD-10-CM | POA: Insufficient documentation

## 2017-08-21 DIAGNOSIS — I10 Essential (primary) hypertension: Secondary | ICD-10-CM | POA: Diagnosis not present

## 2017-08-21 DIAGNOSIS — E039 Hypothyroidism, unspecified: Secondary | ICD-10-CM

## 2017-08-21 MED ORDER — MELOXICAM 15 MG PO TABS: 15.0000 mg | ORAL_TABLET | Freq: Every day | ORAL | 3 refills | Status: DC

## 2017-08-21 NOTE — Assessment & Plan Note (Signed)
Discussed care and treatment will change to meloxicam continue massage therapy.

## 2017-08-21 NOTE — Assessment & Plan Note (Signed)
Slight elevation will observe

## 2017-08-21 NOTE — Assessment & Plan Note (Signed)
The current medical regimen is effective;  continue present plan and medications.  

## 2017-08-21 NOTE — Progress Notes (Signed)
BP (!) 146/87   Pulse 60   Ht 5\' 9"  (1.753 m)   Wt 198 lb 11.2 oz (90.1 kg)   SpO2 98%   BMI 29.34 kg/m    Subjective:    Patient ID: Arthur Richardson, male    DOB: Sep 19, 1950, 67 y.o.   MRN: [id number]  HPI: Arthur Richardson is a 67 y.o. male  Chief Complaint  Patient presents with  . Follow-up  . Hypertension   Patient follow-up all in all doing well blood pressure little bit up as taking a lot of ibuprofen for neck shoulder pain. Patient started working with physical therapy and massage therapy to help which is helping work out his shoulder pain. Taking medications faithfully for thyroid and blood pressure without problems.  Relevant past medical, surgical, family and social history reviewed and updated as indicated. Interim medical history since our last visit reviewed. Allergies and medications reviewed and updated.  Review of Systems  Constitutional: Negative.   Respiratory: Negative.   Cardiovascular: Negative.     Per HPI unless specifically indicated above     Objective:    BP (!) 146/87   Pulse 60   Ht 5\' 9"  (1.753 m)   Wt 198 lb 11.2 oz (90.1 kg)   SpO2 98%   BMI 29.34 kg/m   Wt Readings from Last 3 Encounters:  08/21/17 198 lb 11.2 oz (90.1 kg)  02/20/17 199 lb (90.3 kg)  09/03/16 188 lb 8 oz (85.5 kg)    Physical Exam  Constitutional: He is oriented to person, place, and time. He appears well-developed and well-nourished.  HENT:  Head: Normocephalic and atraumatic.  Eyes: Conjunctivae and EOM are normal.  Neck: Normal range of motion.  Cardiovascular: Normal rate, regular rhythm and normal heart sounds.  Pulmonary/Chest: Effort normal and breath sounds normal.  Musculoskeletal: Normal range of motion.  Trigger point left trapezius identified  Neurological: He is alert and oriented to person, place, and time.  Skin: No erythema.  Psychiatric: He has a normal mood and affect. His behavior is normal. Judgment and thought content normal.      Results for orders placed or performed in visit on 02/20/17  CBC with Differential/Platelet  Result Value Ref Range   WBC 5.9 3.4 - 10.8 x10E3/uL   RBC 4.51 4.14 - 5.80 x10E6/uL   Hemoglobin 14.8 13.0 - 17.7 g/dL   Hematocrit 42.8 37.5 - 51.0 %   MCV 95 79 - 97 fL   MCH 32.8 26.6 - 33.0 pg   MCHC 34.6 31.5 - 35.7 g/dL   RDW 12.7 12.3 - 15.4 %   Platelets 272 150 - 379 x10E3/uL   Neutrophils 46 Not Estab. %   Lymphs 37 Not Estab. %   Monocytes 14 Not Estab. %   Eos 3 Not Estab. %   Basos 0 Not Estab. %   Neutrophils Absolute 2.7 1.4 - 7.0 x10E3/uL   Lymphocytes Absolute 2.2 0.7 - 3.1 x10E3/uL   Monocytes Absolute 0.8 0.1 - 0.9 x10E3/uL   EOS (ABSOLUTE) 0.2 0.0 - 0.4 x10E3/uL   Basophils Absolute 0.0 0.0 - 0.2 x10E3/uL   Immature Granulocytes 0 Not Estab. %   Immature Grans (Abs) 0.0 0.0 - 0.1 x10E3/uL  Comprehensive metabolic panel  Result Value Ref Range   Glucose 107 (H) 65 - 99 mg/dL   BUN 18 8 - 27 mg/dL   Creatinine, Ser 0.98 0.76 - 1.27 mg/dL   GFR calc non Af Amer 79 >59 mL/min/1.73  GFR calc Af Amer 91 >59 mL/min/1.73   BUN/Creatinine Ratio 18 10 - 24   Sodium 138 134 - 144 mmol/L   Potassium 4.5 3.5 - 5.2 mmol/L   Chloride 98 96 - 106 mmol/L   CO2 24 20 - 29 mmol/L   Calcium 10.0 8.6 - 10.2 mg/dL   Total Protein 7.2 6.0 - 8.5 g/dL   Albumin 4.7 3.6 - 4.8 g/dL   Globulin, Total 2.5 1.5 - 4.5 g/dL   Albumin/Globulin Ratio 1.9 1.2 - 2.2   Bilirubin Total 0.6 0.0 - 1.2 mg/dL   Alkaline Phosphatase 117 39 - 117 IU/L   AST 34 0 - 40 IU/L   ALT 42 0 - 44 IU/L  Lipid panel  Result Value Ref Range   Cholesterol, Total 203 (H) 100 - 199 mg/dL   Triglycerides 82 0 - 149 mg/dL   HDL 64 >39 mg/dL   VLDL Cholesterol Cal 16 5 - 40 mg/dL   LDL Calculated 123 (H) 0 - 99 mg/dL   Chol/HDL Ratio 3.2 0.0 - 5.0 ratio  TSH  Result Value Ref Range   TSH 1.730 0.450 - 4.500 uIU/mL      Assessment & Plan:   Problem List Items Addressed This Visit      Cardiovascular  and Mediastinum   Hypertension - Primary    Slight elevation will observe      Relevant Orders   Basic metabolic panel     Endocrine   Hypothyroidism    The current medical regimen is effective;  continue present plan and medications.         Other   Trigger point of left shoulder region    Discussed care and treatment will change to meloxicam continue massage therapy.          Follow up plan: Return in about 6 months (around 02/20/2018) for Physical Exam.

## 2017-08-22 LAB — BASIC METABOLIC PANEL
BUN / CREAT RATIO: 18 (ref 10–24)
BUN: 16 mg/dL (ref 8–27)
CO2: 23 mmol/L (ref 20–29)
CREATININE: 0.89 mg/dL (ref 0.76–1.27)
Calcium: 9.5 mg/dL (ref 8.6–10.2)
Chloride: 98 mmol/L (ref 96–106)
GFR calc non Af Amer: 88 mL/min/{1.73_m2} (ref >59)
GFR, EST AFRICAN AMERICAN: 102 mL/min/{1.73_m2} (ref >59)
Glucose: 83 mg/dL (ref 65–99)
Potassium: 4.1 mmol/L (ref 3.5–5.2)
SODIUM: 138 mmol/L (ref 134–144)

## 2017-08-25 ENCOUNTER — Encounter: Payer: Self-pay | Admitting: Family Medicine

## 2017-09-02 ENCOUNTER — Telehealth: Payer: Self-pay | Admitting: Family Medicine

## 2017-09-02 MED ORDER — AZITHROMYCIN 250 MG PO TABS
ORAL_TABLET | ORAL | 0 refills | Status: DC
Start: 2017-09-02 — End: 2018-02-23

## 2017-09-02 NOTE — Telephone Encounter (Signed)
Patient feels he is getting a sinus infection and would like Dr Jeananne Rama to send him a Zpak for this to [facility] in [place].    Thank You

## 2017-09-02 NOTE — Telephone Encounter (Signed)
done

## 2017-09-04 ENCOUNTER — Telehealth: Payer: Self-pay | Admitting: Family Medicine

## 2017-09-04 NOTE — Telephone Encounter (Signed)
Copied from [place] [phone]. Topic: Quick Communication - See Telephone Encounter >> Sep 04, 2017  3:05 PM Neva Seat wrote:  losartan-hydrochlorothiazide (HYZAAR) 100-25 MG tablet Pt received the letter from company that makes the Rx.  Pt is needing to know what he needs to do due to the recall.  He called pharmacy and company, both are telling him different things. Please call pt to discuss asap.

## 2017-09-05 NOTE — Telephone Encounter (Signed)
Please let him know that not all the batches of losartan are contaminated. His pharmacy can tell him if he's been affected and give him a different batch. Thanks!

## 2017-09-05 NOTE — Telephone Encounter (Signed)
Dr. Jeananne Rama, What should I explain to patient?

## 2017-09-05 NOTE — Telephone Encounter (Signed)
Called and spoke to patient. He had already got it handled w/ the pharmacy.

## 2017-12-03 DIAGNOSIS — M5416 Radiculopathy, lumbar region: Secondary | ICD-10-CM | POA: Diagnosis not present

## 2017-12-03 DIAGNOSIS — M5126 Other intervertebral disc displacement, lumbar region: Secondary | ICD-10-CM | POA: Diagnosis not present

## 2018-02-02 ENCOUNTER — Ambulatory Visit (INDEPENDENT_AMBULATORY_CARE_PROVIDER_SITE_OTHER): Payer: Medicare Other

## 2018-02-02 DIAGNOSIS — Z23 Encounter for immunization: Secondary | ICD-10-CM

## 2018-02-18 ENCOUNTER — Other Ambulatory Visit: Payer: Self-pay | Admitting: Family Medicine

## 2018-02-18 DIAGNOSIS — K209 Esophagitis, unspecified: Secondary | ICD-10-CM

## 2018-02-18 DIAGNOSIS — N401 Enlarged prostate with lower urinary tract symptoms: Principal | ICD-10-CM

## 2018-02-18 DIAGNOSIS — K2270 Barrett's esophagus without dysplasia: Secondary | ICD-10-CM

## 2018-02-18 DIAGNOSIS — I10 Essential (primary) hypertension: Secondary | ICD-10-CM

## 2018-02-18 DIAGNOSIS — N138 Other obstructive and reflux uropathy: Secondary | ICD-10-CM

## 2018-02-18 NOTE — Telephone Encounter (Signed)
Requested Prescriptions  Pending Prescriptions Disp Refills  . tamsulosin (FLOMAX) 0.4 MG CAPS capsule [Pharmacy Med Name: TAMSULOSIN  0.4MG   CAP] 90 capsule 0    Sig: TAKE 1 CAPSULE BY MOUTH  DAILY     Urology: Alpha-Adrenergic Blocker Failed - 02/18/2018  5:34 AM      Failed - Last BP in normal range    BP Readings from Last 1 Encounters:  08/21/17 (!) 146/87         Passed - Valid encounter within last 12 months    Recent Outpatient Visits          6 months ago Essential hypertension   [place] [facility], Jeannette How, MD   12 months ago Essential hypertension   [facility], Jeannette How, MD   1 year ago Benign prostatic hyperplasia with incomplete bladder emptying   [facility] [place], Megan P, DO   1 year ago Essential hypertension   [place] [facility], Jeannette How, MD   1 year ago Right hip pain   [place], Lilia Argue, [place]      Future Appointments            In 5 days Criss[facility], Jeannette How, MD [place]         . amLODipine (Chauncey) 5 MG tablet [Pharmacy Med Name: AMLODIPINE  5MG   TAB] 90 tablet 4    Sig: TAKE 1 TABLET BY MOUTH  DAILY     Cardiovascular:  Calcium Channel Blockers Failed - 02/18/2018  5:34 AM      Failed - Last BP in normal range    BP Readings from Last 1 Encounters:  08/21/17 (!) 146/87         Failed - Valid encounter within last 6 months    Recent Outpatient Visits          6 months ago Essential hypertension   [place] [facility], Jeannette How, MD   12 months ago Essential hypertension   [place] [facility], Jeannette How, MD   1 year ago Benign prostatic hyperplasia with incomplete bladder emptying   [place], Megan P, DO   1 year ago Essential hypertension   [facility], Jeannette How, MD   1 year ago Right hip pain   [place], Lilia Argue, [place]      Future  Appointments            In 5 days Crissman, Jeannette How, MD [facility]         . omeprazole (PRILOSEC) 40 MG capsule [Pharmacy Med Name: OMEPRAZOLE  40MG   CAP] 90 capsule 4    Sig: TAKE 1 CAPSULE BY MOUTH  DAILY     Gastroenterology: Proton Pump Inhibitors Passed - 02/18/2018  5:34 AM      Passed - Valid encounter within last 12 months    Recent Outpatient Visits          6 months ago Essential hypertension   [place] [facility], Jeannette How, MD   12 months ago Essential hypertension   [place], Jeannette How, MD   1 year ago Benign prostatic hyperplasia with incomplete bladder emptying   [place], Megan P, DO   1 year ago Essential hypertension   [place], Jeannette How, MD   1 year ago Right hip pain   [facility] Merrie Roof  Benjamine Mola, PA-C      Future Appointments            In 5 days Crissman, Jeannette How, MD [facility], [facility]

## 2018-02-23 ENCOUNTER — Ambulatory Visit (INDEPENDENT_AMBULATORY_CARE_PROVIDER_SITE_OTHER): Payer: Medicare Other | Admitting: Family Medicine

## 2018-02-23 ENCOUNTER — Encounter: Payer: Self-pay | Admitting: Family Medicine

## 2018-02-23 VITALS — BP 136/86 | HR 56 | Ht 68.0 in | Wt 201.8 lb

## 2018-02-23 DIAGNOSIS — I10 Essential (primary) hypertension: Secondary | ICD-10-CM

## 2018-02-23 DIAGNOSIS — E7849 Other hyperlipidemia: Secondary | ICD-10-CM

## 2018-02-23 DIAGNOSIS — N138 Other obstructive and reflux uropathy: Secondary | ICD-10-CM | POA: Diagnosis not present

## 2018-02-23 DIAGNOSIS — N200 Calculus of kidney: Secondary | ICD-10-CM

## 2018-02-23 DIAGNOSIS — Z7189 Other specified counseling: Secondary | ICD-10-CM

## 2018-02-23 DIAGNOSIS — K209 Esophagitis, unspecified: Secondary | ICD-10-CM

## 2018-02-23 DIAGNOSIS — E039 Hypothyroidism, unspecified: Secondary | ICD-10-CM

## 2018-02-23 DIAGNOSIS — Z125 Encounter for screening for malignant neoplasm of prostate: Secondary | ICD-10-CM

## 2018-02-23 DIAGNOSIS — K2270 Barrett's esophagus without dysplasia: Secondary | ICD-10-CM

## 2018-02-23 DIAGNOSIS — Z0000 Encounter for general adult medical examination without abnormal findings: Secondary | ICD-10-CM

## 2018-02-23 DIAGNOSIS — N401 Enlarged prostate with lower urinary tract symptoms: Secondary | ICD-10-CM

## 2018-02-23 LAB — URINALYSIS, ROUTINE W REFLEX MICROSCOPIC
Bilirubin, UA: NEGATIVE
Glucose, UA: NEGATIVE
Ketones, UA: NEGATIVE
LEUKOCYTES UA: NEGATIVE
Nitrite, UA: NEGATIVE
PH UA: 7 (ref 5.0–7.5)
PROTEIN UA: NEGATIVE
RBC, UA: NEGATIVE
Specific Gravity, UA: 1.015 (ref 1.005–1.030)
Urobilinogen, Ur: 0.2 mg/dL (ref 0.2–1.0)

## 2018-02-23 MED ORDER — OMEPRAZOLE 40 MG PO CPDR: 40.0000 mg | DELAYED_RELEASE_CAPSULE | Freq: Every day | ORAL | 4 refills | Status: DC

## 2018-02-23 MED ORDER — HYDROCHLOROTHIAZIDE 25 MG PO TABS: 25.0000 mg | ORAL_TABLET | Freq: Every day | ORAL | 4 refills | Status: DC

## 2018-02-23 MED ORDER — LEVOTHYROXINE SODIUM 50 MCG PO TABS: 50.0000 ug | ORAL_TABLET | Freq: Every day | ORAL | 4 refills | Status: DC

## 2018-02-23 MED ORDER — AMLODIPINE BESYLATE 5 MG PO TABS: 5.0000 mg | ORAL_TABLET | Freq: Every day | ORAL | 4 refills | Status: DC

## 2018-02-23 MED ORDER — MELOXICAM 15 MG PO TABS: 15.0000 mg | ORAL_TABLET | Freq: Every day | ORAL | 3 refills | Status: DC

## 2018-02-23 MED ORDER — LOSARTAN POTASSIUM 100 MG PO TABS: 100.0000 mg | ORAL_TABLET | Freq: Every day | ORAL | 4 refills | Status: DC

## 2018-02-23 MED ORDER — TAMSULOSIN HCL 0.4 MG PO CAPS: 0.4000 mg | ORAL_CAPSULE | Freq: Every day | ORAL | 4 refills | Status: DC

## 2018-02-23 NOTE — Assessment & Plan Note (Signed)
A voluntary discussion about advanced care planning including explanation and discussion of advanced directives was extentively discussed with the patient.  Explained about the healthcare proxy and living will was reviewed and packet with forms with expiration of how to fill them out was given.  Time spent: Encounter 16+ min individuals present: Patient 

## 2018-02-23 NOTE — Assessment & Plan Note (Signed)
The current medical regimen is effective;  continue present plan and medications.  

## 2018-02-23 NOTE — Assessment & Plan Note (Signed)
The current medical regimen is effective;  continue present plan and medications. a 

## 2018-02-23 NOTE — Progress Notes (Signed)
BP 136/86 (BP Location: Left Arm)   Pulse (!) 56   Ht 5\' 8"  (1.727 m)   Wt 201 lb 12.8 oz (91.5 kg)   SpO2 99%   BMI 30.68 kg/m    Subjective:    Patient ID: Arthur Richardson, male    DOB: [birth year], 67 y.o.   MRN: [id number]  HPI: Arthur Richardson is a 67 y.o. male  Chief Complaint  Patient presents with  . Annual Exam  Patient with no complaints taking thyroid faithfully without problems.  Blood pressure good control with amlodipine needs to separate losartan and hydrochlorothiazide because of drug availability. Biggest problem today is back pain arthritis has taken lots of ibuprofen wants to try something else such as meloxicam. Has some BPH changes but otherwise been doing well tamsulosin seems to work okay.  Reflux also stable.   Relevant past medical, surgical, family and social history reviewed and updated as indicated. Interim medical history since our last visit reviewed. Allergies and medications reviewed and updated.  Review of Systems  Constitutional: Negative.   HENT: Negative.   Eyes: Negative.   Respiratory: Negative.   Cardiovascular: Negative.   Gastrointestinal: Negative.   Endocrine: Negative.   Genitourinary: Negative.   Musculoskeletal: Negative.   Skin: Negative.   Allergic/Immunologic: Negative.   Neurological: Negative.   Hematological: Negative.   Psychiatric/Behavioral: Negative.     Per HPI unless specifically indicated above     Objective:    BP 136/86 (BP Location: Left Arm)   Pulse (!) 56   Ht 5\' 8"  (1.727 m)   Wt 201 lb 12.8 oz (91.5 kg)   SpO2 99%   BMI 30.68 kg/m   Wt Readings from Last 3 Encounters:  02/23/18 201 lb 12.8 oz (91.5 kg)  08/21/17 198 lb 11.2 oz (90.1 kg)  02/20/17 199 lb (90.3 kg)    Physical Exam  Constitutional: He is oriented to person, place, and time. He appears well-developed and well-nourished.  HENT:  Head: Normocephalic and atraumatic.  Right Ear: External ear normal.  Left Ear: External  ear normal.  Eyes: Pupils are equal, round, and reactive to light. Conjunctivae and EOM are normal.  Neck: Normal range of motion. Neck supple.  Cardiovascular: Normal rate, regular rhythm, normal heart sounds and intact distal pulses.  Pulmonary/Chest: Effort normal and breath sounds normal.  Abdominal: Soft. Bowel sounds are normal. There is no splenomegaly or hepatomegaly.  Genitourinary: Rectum normal and penis normal.  Genitourinary Comments: BPH changes  Musculoskeletal: Normal range of motion.  Neurological: He is alert and oriented to person, place, and time. He has normal reflexes.  Skin: No rash noted. No erythema.  Psychiatric: He has a normal mood and affect. His behavior is normal. Judgment and thought content normal.    Results for orders placed or performed in visit on 51/02/58  Basic metabolic panel  Result Value Ref Range   Glucose 83 65 - 99 mg/dL   BUN 16 8 - 27 mg/dL   Creatinine, Ser 0.89 0.76 - 1.27 mg/dL   GFR calc non Af Amer 88 >59 mL/min/1.73   GFR calc Af Amer 102 >59 mL/min/1.73   BUN/Creatinine Ratio 18 10 - 24   Sodium 138 134 - 144 mmol/L   Potassium 4.1 3.5 - 5.2 mmol/L   Chloride 98 96 - 106 mmol/L   CO2 23 20 - 29 mmol/L   Calcium 9.5 8.6 - 10.2 mg/dL      Assessment & Plan:   Problem  List Items Addressed This Visit      Cardiovascular and Mediastinum   Hypertension - Primary    The current medical regimen is effective;  continue present plan and medications.       Relevant Medications   hydrochlorothiazide (HYDRODIURIL) 25 MG tablet   amLODipine (NORVASC) 5 MG tablet   losartan (COZAAR) 100 MG tablet     Digestive   Barrett's esophagus with esophagitis   Relevant Medications   omeprazole (PRILOSEC) 40 MG capsule     Endocrine   Hypothyroidism    The current medical regimen is effective;  continue present plan and medications. a      Relevant Medications   levothyroxine (SYNTHROID, LEVOTHROID) 50 MCG tablet   Other Relevant  Orders   TSH     Genitourinary   Nephrolithiasis   Relevant Medications   hydrochlorothiazide (HYDRODIURIL) 25 MG tablet   Other Relevant Orders   CBC with Differential/Platelet   Comprehensive metabolic panel   Lipid panel   Urinalysis, Routine w reflex microscopic   BPH with obstruction/lower urinary tract symptoms    The current medical regimen is effective;  continue present plan and medications.       Relevant Medications   tamsulosin (FLOMAX) 0.4 MG CAPS capsule     Other   Hyperlipidemia    The current medical regimen is effective;  continue present plan and medications.       Relevant Medications   hydrochlorothiazide (HYDRODIURIL) 25 MG tablet   amLODipine (NORVASC) 5 MG tablet   losartan (COZAAR) 100 MG tablet   Other Relevant Orders   CBC with Differential/Platelet   Comprehensive metabolic panel   Lipid panel   Urinalysis, Routine w reflex microscopic   Advanced care planning/counseling discussion    A voluntary discussion about advanced care planning including explanation and discussion of advanced directives was extentively discussed with the patient.  Explained about the healthcare proxy and living will was reviewed and packet with forms with expiration of how to fill them out was given.  Time spent: Encounter 16+ min individuals present: Patient       Other Visit Diagnoses    PE (physical exam), annual       Prostate cancer screening       Relevant Orders   PSA       Follow up plan: Return for BMP,  Lipids, ALT, AST.

## 2018-02-24 ENCOUNTER — Encounter: Payer: Self-pay | Admitting: Family Medicine

## 2018-02-24 LAB — COMPREHENSIVE METABOLIC PANEL
A/G RATIO: 1.9 (ref 1.2–2.2)
ALT: 31 IU/L (ref 0–44)
AST: 22 IU/L (ref 0–40)
Albumin: 4.5 g/dL (ref 3.6–4.8)
Alkaline Phosphatase: 112 IU/L (ref 39–117)
BUN/Creatinine Ratio: 14 (ref 10–24)
BUN: 14 mg/dL (ref 8–27)
Bilirubin Total: 0.4 mg/dL (ref 0.0–1.2)
CALCIUM: 9.7 mg/dL (ref 8.6–10.2)
CO2: 24 mmol/L (ref 20–29)
Chloride: 97 mmol/L (ref 96–106)
Creatinine, Ser: 1.01 mg/dL (ref 0.76–1.27)
GFR calc non Af Amer: 76 mL/min/{1.73_m2} (ref >59)
GFR, EST AFRICAN AMERICAN: 87 mL/min/{1.73_m2} (ref >59)
GLOBULIN, TOTAL: 2.4 g/dL (ref 1.5–4.5)
Glucose: 87 mg/dL (ref 65–99)
Potassium: 3.9 mmol/L (ref 3.5–5.2)
SODIUM: 138 mmol/L (ref 134–144)
TOTAL PROTEIN: 6.9 g/dL (ref 6.0–8.5)

## 2018-02-24 LAB — CBC WITH DIFFERENTIAL/PLATELET
Basophils Absolute: 0 10*3/uL (ref 0.0–0.2)
Basos: 1 %
EOS (ABSOLUTE): 0.3 10*3/uL (ref 0.0–0.4)
Eos: 4 %
HEMATOCRIT: 45.2 % (ref 37.5–51.0)
Hemoglobin: 15.3 g/dL (ref 13.0–17.7)
IMMATURE GRANS (ABS): 0 10*3/uL (ref 0.0–0.1)
Immature Granulocytes: 0 %
LYMPHS ABS: 2.1 10*3/uL (ref 0.7–3.1)
LYMPHS: 30 %
MCH: 31.7 pg (ref 26.6–33.0)
MCHC: 33.8 g/dL (ref 31.5–35.7)
MCV: 94 fL (ref 79–97)
MONOCYTES: 11 %
Monocytes Absolute: 0.8 10*3/uL (ref 0.1–0.9)
Neutrophils Absolute: 3.9 10*3/uL (ref 1.4–7.0)
Neutrophils: 54 %
Platelets: 301 10*3/uL (ref 150–450)
RBC: 4.83 x10E6/uL (ref 4.14–5.80)
RDW: 11.7 % — AB (ref 12.3–15.4)
WBC: 7.1 10*3/uL (ref 3.4–10.8)

## 2018-02-24 LAB — LIPID PANEL
CHOL/HDL RATIO: 3.4 ratio (ref 0.0–5.0)
Cholesterol, Total: 162 mg/dL (ref 100–199)
HDL: 48 mg/dL (ref >39)
LDL Calculated: 96 mg/dL (ref 0–99)
Triglycerides: 91 mg/dL (ref 0–149)
VLDL Cholesterol Cal: 18 mg/dL (ref 5–40)

## 2018-02-24 LAB — TSH: TSH: 1.97 u[IU]/mL (ref 0.450–4.500)

## 2018-02-24 LAB — PSA: PROSTATE SPECIFIC AG, SERUM: 3.5 ng/mL (ref 0.0–4.0)

## 2018-04-24 ENCOUNTER — Encounter: Payer: Self-pay | Admitting: Family Medicine

## 2018-07-26 IMAGING — MR MR LUMBAR SPINE W/O CM
5 series · 38 of 48 positions shown · non-contrast
Comparison: None.

CLINICAL DATA: No surgery. Fell 2 years ago. RIGHT-sided low back
pain radiating to the RIGHT leg.

EXAM:
MRI LUMBAR SPINE WITHOUT CONTRAST
TECHNIQUE: Multiplanar, multisequence MR imaging of the lumbar spine was
performed. No intravenous contrast was administered.

[Series 2: T2 · sagittal · 4.0mm · 0.81mm/px · 6 of 15 slices shown (1 of 2)]
[im 1/15]
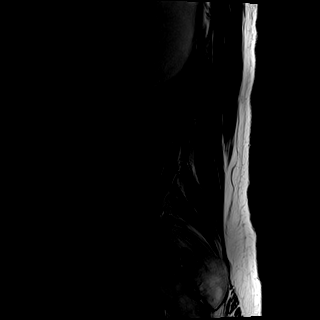
[im 3/15]
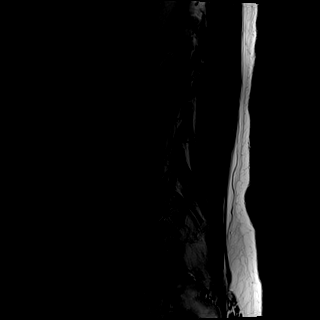
[im 6/15]
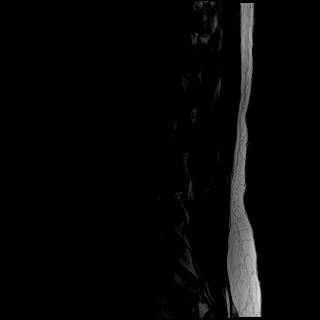
[im 9/15]
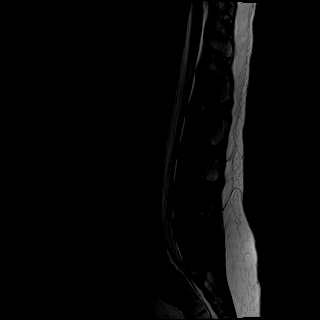
[im 12/15]
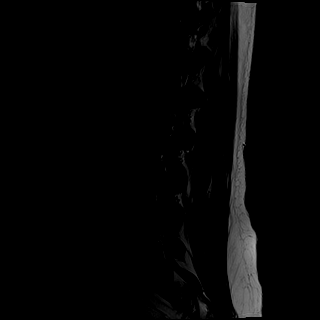
[im 15/15]
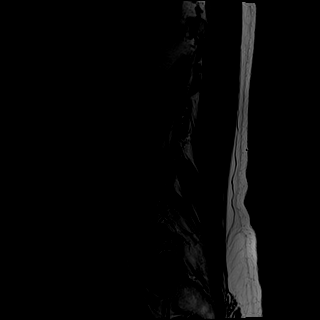

[Series 3: T1 · sagittal · 4.0mm · 0.81mm/px · 6 of 15 slices shown (1 of 2)]
[im 1/15]
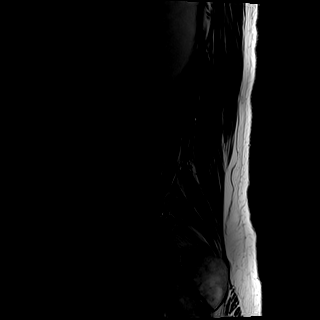
[im 3/15]
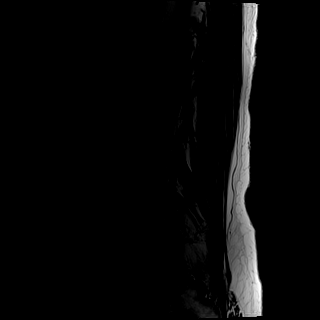
[im 6/15]
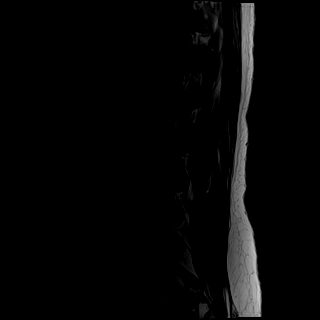
[im 9/15]
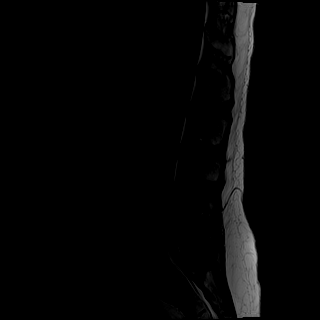
[im 12/15]
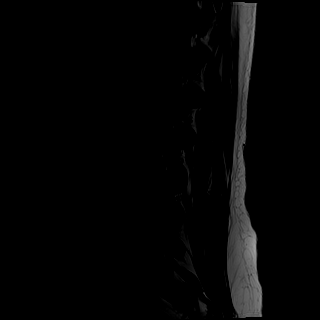
[im 15/15]
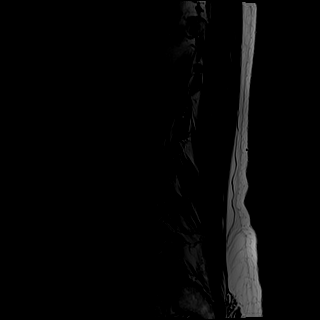

[Series 4: STIR · sagittal · 4.0mm · 1.02mm/px · 6 of 15 slices shown]
[im 1/15]
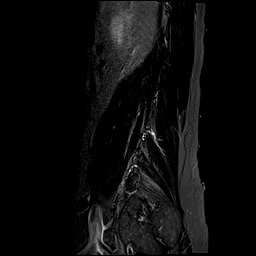
[im 3/15]
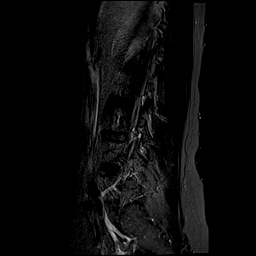
[im 6/15]
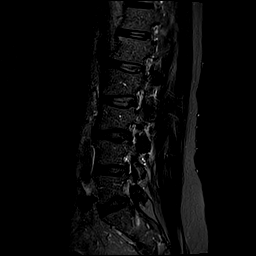
[im 9/15]
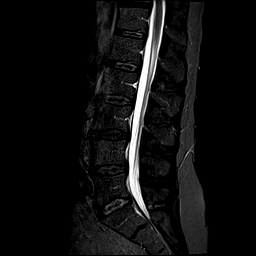
[im 12/15]
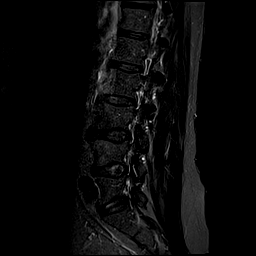
[im 15/15]
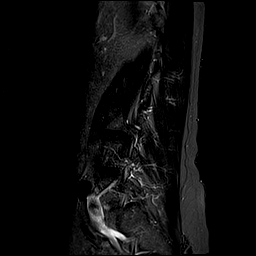

[Series 5: T2 · axial · 4.0mm · 0.78mm/px · z∈[-105,+124]mm · 11 of 40 slices shown (2 of 2)]
[im 1/40]
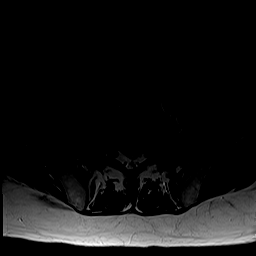
[im 3/40]
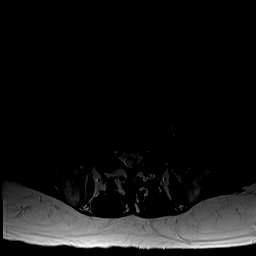
[im 6/40]
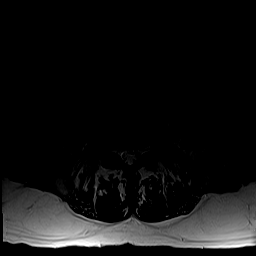
[im 9/40]
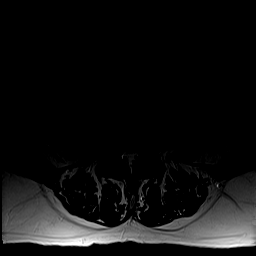
[im 12/40]
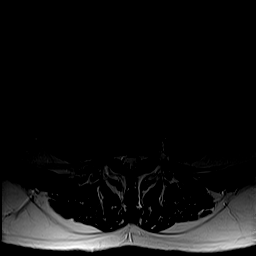
[im 17/40]
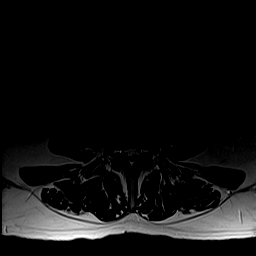
[im 20/40]
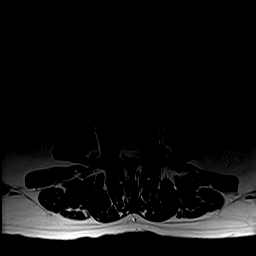
[im 23/40]
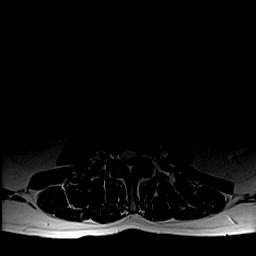
[im 28/40]
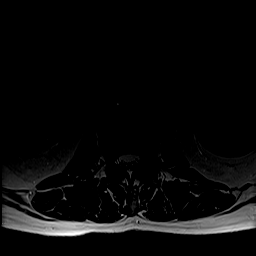
[im 34/40]
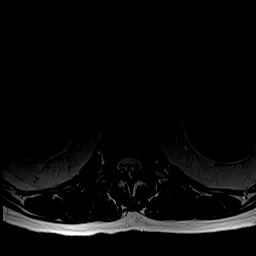
[im 40/40]
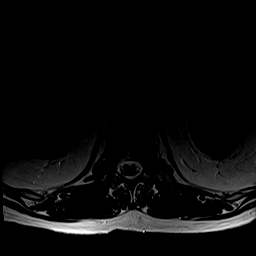

[Series 6: T1 · axial · 4.0mm · 0.39mm/px · z∈[-105,+124]mm · 9 of 40 slices shown (2 of 2)]
[im 1/40]
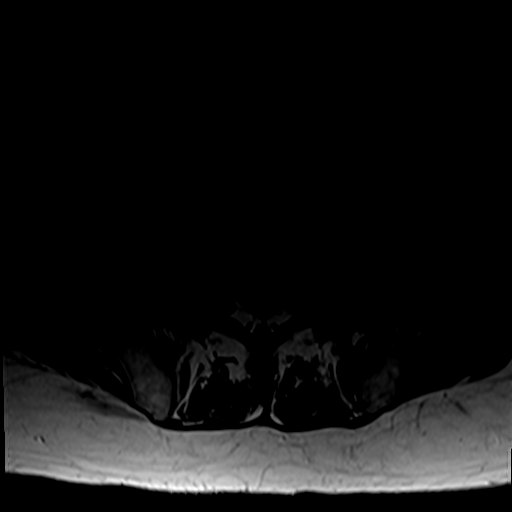
[im 6/40]
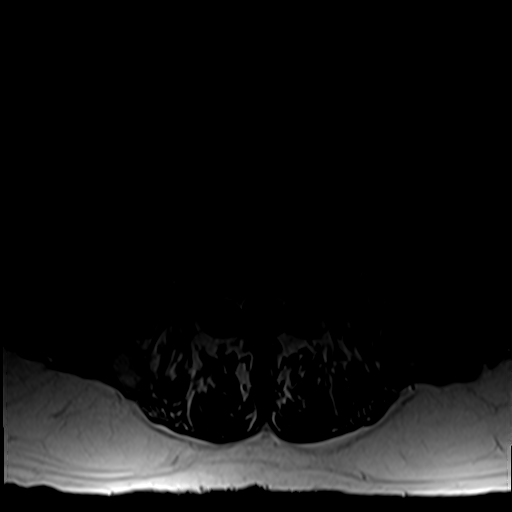
[im 12/40]
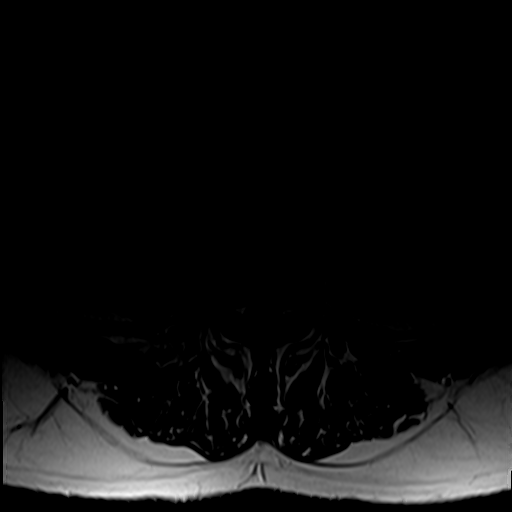
[im 17/40]
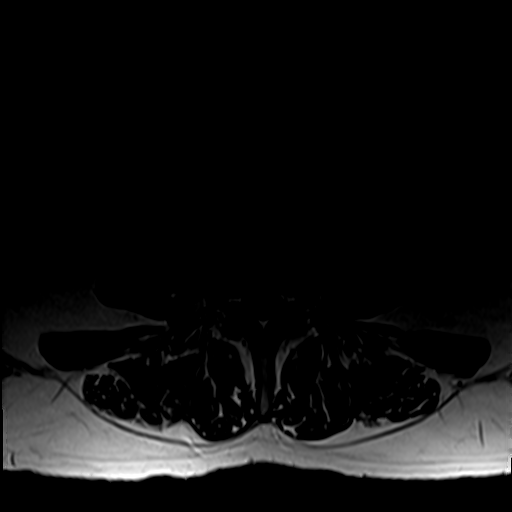
[im 20/40]
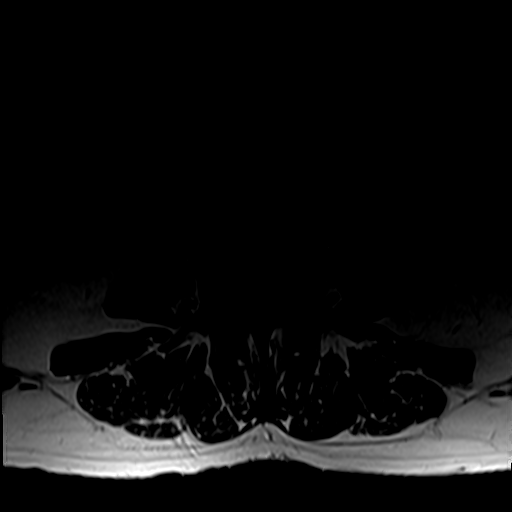
[im 23/40]
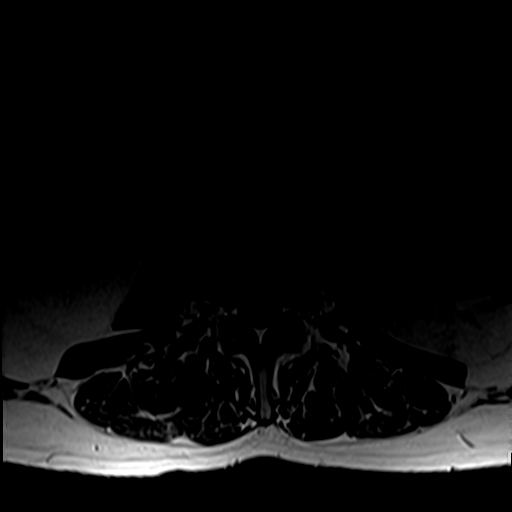
[im 28/40]
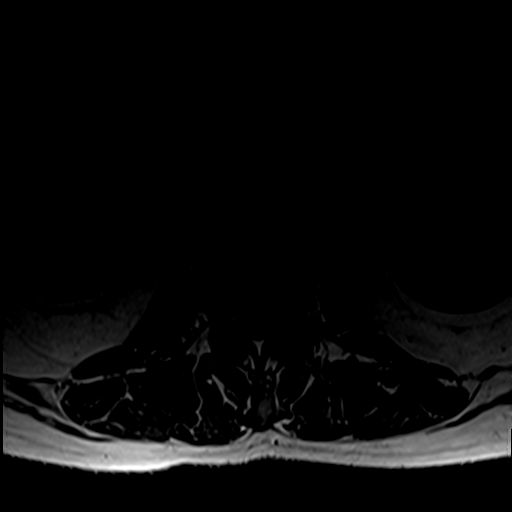
[im 34/40]
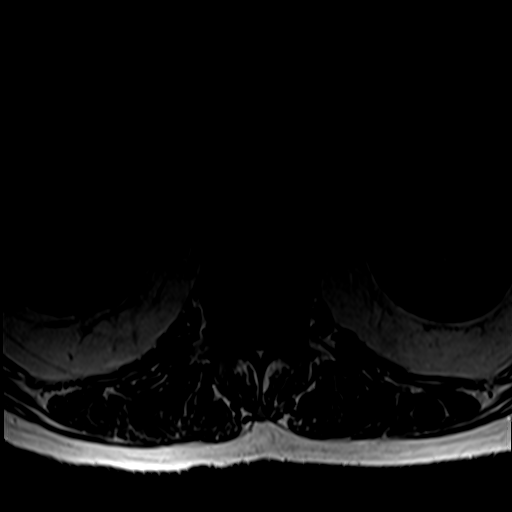
[im 40/40]
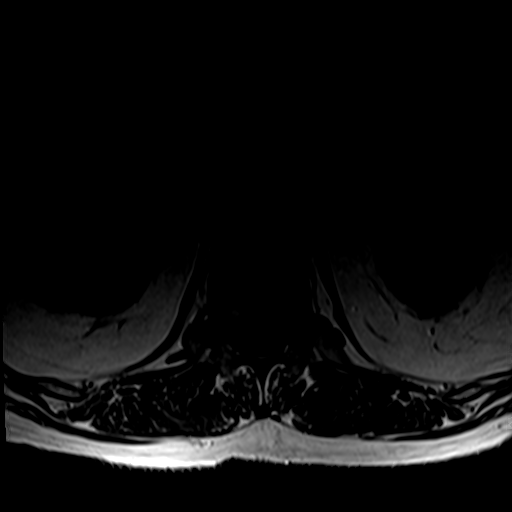

[38 of 48 positions shown; findings below may reference images not displayed]

FINDINGS: Segmentation:  Move

Alignment:  Physiologic.

Vertebrae:  No fracture, evidence of discitis, or bone lesion.

Conus medullaris: Extends to the L1 level and appears normal.

Paraspinal and other soft tissues: Unremarkable.

Disc levels:

L1-L2:  Normal.

L2-L3:  Normal.

L3-L4: Annular bulge. Posterior element hypertrophy. No impingement.

L4-L5: There is a large extruded disc fragment in the RIGHT neural
foramen, extending to the extraforaminal soft tissues. Axial image
30 and sagittal image 4 are representative. Severe RIGHT L4 nerve
root impingement. No significant stenosis or subarticular zone
narrowing.

L5-S1:  Unremarkable.
IMPRESSION: The dominant RIGHT-sided abnormality is at L4-5 where a large disc
extrusion extends into the foramen and beyond, causing severe RIGHT
L4 nerve root impingement in the extraforaminal compartment.

Minor lumbar spondylosis elsewhere.

## 2018-08-20 ENCOUNTER — Other Ambulatory Visit: Payer: Self-pay

## 2018-08-20 ENCOUNTER — Ambulatory Visit (INDEPENDENT_AMBULATORY_CARE_PROVIDER_SITE_OTHER): Payer: Medicare Other | Admitting: Family Medicine

## 2018-08-20 ENCOUNTER — Encounter: Payer: Self-pay | Admitting: Family Medicine

## 2018-08-20 DIAGNOSIS — E039 Hypothyroidism, unspecified: Secondary | ICD-10-CM | POA: Diagnosis not present

## 2018-08-20 DIAGNOSIS — N401 Enlarged prostate with lower urinary tract symptoms: Secondary | ICD-10-CM

## 2018-08-20 DIAGNOSIS — I10 Essential (primary) hypertension: Secondary | ICD-10-CM

## 2018-08-20 DIAGNOSIS — M5136 Other intervertebral disc degeneration, lumbar region: Secondary | ICD-10-CM

## 2018-08-20 DIAGNOSIS — N138 Other obstructive and reflux uropathy: Secondary | ICD-10-CM | POA: Diagnosis not present

## 2018-08-20 NOTE — Assessment & Plan Note (Signed)
The current medical regimen is effective;  continue present plan and medications.  

## 2018-08-20 NOTE — Assessment & Plan Note (Signed)
Doing better with wt loss

## 2018-08-20 NOTE — Progress Notes (Signed)
There were no vitals taken for this visit.   Subjective:    Patient ID: Arthur Richardson, male    DOB: January 14, 1951, 68 y.o.   MRN: [id number]  HPI: Arthur Richardson is a 68 y.o. male  Med check  Telemedicine using audio/video telecommunications for a synchronous communication visit. Today's visit due to COVID-19 isolation precautions I connected with and verified that I am speaking with the correct person using two identifiers.   I discussed the limitations, risks, security and privacy concerns of performing an evaluation and management service by telecommunication and the availability of in person appointments. I also discussed with the patient that there may be a patient responsible charge related to this service. The patient expressed understanding and agreed to proceed. The patient's location is home. I am at home.  Discussed with patient medications doing well no complaints blood pressure doing well without problems. BPH symptoms with tamsulosin also doing well. Not using as much meloxicam because back is better with weight loss. Reflux also stable on Prilosec. Thyroid also stable.   Relevant past medical, surgical, family and social history reviewed and updated as indicated. Interim medical history since our last visit reviewed. Allergies and medications reviewed and updated.  Review of Systems  Constitutional: Negative.   Respiratory: Negative.   Cardiovascular: Negative.     Per HPI unless specifically indicated above     Objective:    There were no vitals taken for this visit.  Wt Readings from Last 3 Encounters:  02/23/18 201 lb 12.8 oz (91.5 kg)  08/21/17 198 lb 11.2 oz (90.1 kg)  02/20/17 199 lb (90.3 kg)    Physical Exam  Results for orders placed or performed in visit on 02/23/18  CBC with Differential/Platelet  Result Value Ref Range   WBC 7.1 3.4 - 10.8 x10E3/uL   RBC 4.83 4.14 - 5.80 x10E6/uL   Hemoglobin 15.3 13.0 - 17.7 g/dL   Hematocrit  45.2 37.5 - 51.0 %   MCV 94 79 - 97 fL   MCH 31.7 26.6 - 33.0 pg   MCHC 33.8 31.5 - 35.7 g/dL   RDW 11.7 (L) 12.3 - 15.4 %   Platelets 301 150 - 450 x10E3/uL   Neutrophils 54 Not Estab. %   Lymphs 30 Not Estab. %   Monocytes 11 Not Estab. %   Eos 4 Not Estab. %   Basos 1 Not Estab. %   Neutrophils Absolute 3.9 1.4 - 7.0 x10E3/uL   Lymphocytes Absolute 2.1 0.7 - 3.1 x10E3/uL   Monocytes Absolute 0.8 0.1 - 0.9 x10E3/uL   EOS (ABSOLUTE) 0.3 0.0 - 0.4 x10E3/uL   Basophils Absolute 0.0 0.0 - 0.2 x10E3/uL   Immature Granulocytes 0 Not Estab. %   Immature Grans (Abs) 0.0 0.0 - 0.1 x10E3/uL  Comprehensive metabolic panel  Result Value Ref Range   Glucose 87 65 - 99 mg/dL   BUN 14 8 - 27 mg/dL   Creatinine, Ser 1.01 0.76 - 1.27 mg/dL   GFR calc non Af Amer 76 >59 mL/min/1.73   GFR calc Af Amer 87 >59 mL/min/1.73   BUN/Creatinine Ratio 14 10 - 24   Sodium 138 134 - 144 mmol/L   Potassium 3.9 3.5 - 5.2 mmol/L   Chloride 97 96 - 106 mmol/L   CO2 24 20 - 29 mmol/L   Calcium 9.7 8.6 - 10.2 mg/dL   Total Protein 6.9 6.0 - 8.5 g/dL   Albumin 4.5 3.6 - 4.8 g/dL   Globulin, Total  2.4 1.5 - 4.5 g/dL   Albumin/Globulin Ratio 1.9 1.2 - 2.2   Bilirubin Total 0.4 0.0 - 1.2 mg/dL   Alkaline Phosphatase 112 39 - 117 IU/L   AST 22 0 - 40 IU/L   ALT 31 0 - 44 IU/L  Lipid panel  Result Value Ref Range   Cholesterol, Total 162 100 - 199 mg/dL   Triglycerides 91 0 - 149 mg/dL   HDL 48 >39 mg/dL   VLDL Cholesterol Cal 18 5 - 40 mg/dL   LDL Calculated 96 0 - 99 mg/dL   Chol/HDL Ratio 3.4 0.0 - 5.0 ratio  PSA  Result Value Ref Range   Prostate Specific Ag, Serum 3.5 0.0 - 4.0 ng/mL  Urinalysis, Routine w reflex microscopic  Result Value Ref Range   Specific Gravity, UA 1.015 1.005 - 1.030   pH, UA 7.0 5.0 - 7.5   Color, UA Yellow Yellow   Appearance Ur Clear Clear   Leukocytes, UA Negative Negative   Protein, UA Negative Negative/Trace   Glucose, UA Negative Negative   Ketones, UA Negative  Negative   RBC, UA Negative Negative   Bilirubin, UA Negative Negative   Urobilinogen, Ur 0.2 0.2 - 1.0 mg/dL   Nitrite, UA Negative Negative  TSH  Result Value Ref Range   TSH 1.970 0.450 - 4.500 uIU/mL      Assessment & Plan:   Problem List Items Addressed This Visit      Cardiovascular and Mediastinum   Hypertension    The current medical regimen is effective;  continue present plan and medications.         Endocrine   Hypothyroidism    The current medical regimen is effective;  continue present plan and medications.         Musculoskeletal and Integument   DDD (degenerative disc disease), lumbar    Doing better with wt loss        Genitourinary   BPH with obstruction/lower urinary tract symptoms    The current medical regimen is effective;  continue present plan and medications.          I discussed the assessment and treatment plan with the patient. The patient was provided an opportunity to ask questions and all were answered. The patient agreed with the plan and demonstrated an understanding of the instructions.   The patient was advised to call back or seek an in-person evaluation if the symptoms worsen or if the condition fails to improve as anticipated.   I provided 21+ minutes of time during this encounter. Follow up plan: Return in about 6 months (around 02/19/2019) for Physical Exam.

## 2018-08-25 ENCOUNTER — Ambulatory Visit: Payer: Medicare Other | Admitting: Family Medicine

## 2018-12-28 DIAGNOSIS — H2513 Age-related nuclear cataract, bilateral: Secondary | ICD-10-CM | POA: Diagnosis not present

## 2018-12-28 DIAGNOSIS — H25013 Cortical age-related cataract, bilateral: Secondary | ICD-10-CM | POA: Diagnosis not present

## 2019-01-11 ENCOUNTER — Other Ambulatory Visit: Payer: Self-pay

## 2019-01-11 ENCOUNTER — Ambulatory Visit (INDEPENDENT_AMBULATORY_CARE_PROVIDER_SITE_OTHER): Payer: Medicare Other

## 2019-01-11 DIAGNOSIS — Z23 Encounter for immunization: Secondary | ICD-10-CM

## 2019-02-03 ENCOUNTER — Encounter: Payer: Medicare Other | Admitting: Family Medicine

## 2019-02-03 ENCOUNTER — Ambulatory Visit: Payer: Medicare Other

## 2019-03-22 ENCOUNTER — Encounter: Payer: Medicare Other | Admitting: Nurse Practitioner

## 2019-04-05 ENCOUNTER — Other Ambulatory Visit: Payer: Self-pay

## 2019-04-06 ENCOUNTER — Encounter: Payer: Self-pay | Admitting: Family Medicine

## 2019-04-06 ENCOUNTER — Other Ambulatory Visit: Payer: Self-pay

## 2019-04-06 ENCOUNTER — Ambulatory Visit (INDEPENDENT_AMBULATORY_CARE_PROVIDER_SITE_OTHER): Payer: Medicare Other | Admitting: Family Medicine

## 2019-04-06 VITALS — BP 146/89 | HR 74 | Temp 98.2°F | Ht 68.2 in | Wt 213.0 lb

## 2019-04-06 DIAGNOSIS — K209 Esophagitis, unspecified without bleeding: Secondary | ICD-10-CM | POA: Diagnosis not present

## 2019-04-06 DIAGNOSIS — K2270 Barrett's esophagus without dysplasia: Secondary | ICD-10-CM | POA: Diagnosis not present

## 2019-04-06 DIAGNOSIS — Z0000 Encounter for general adult medical examination without abnormal findings: Secondary | ICD-10-CM

## 2019-04-06 DIAGNOSIS — N138 Other obstructive and reflux uropathy: Secondary | ICD-10-CM | POA: Diagnosis not present

## 2019-04-06 DIAGNOSIS — E039 Hypothyroidism, unspecified: Secondary | ICD-10-CM

## 2019-04-06 DIAGNOSIS — Z23 Encounter for immunization: Secondary | ICD-10-CM

## 2019-04-06 DIAGNOSIS — N401 Enlarged prostate with lower urinary tract symptoms: Secondary | ICD-10-CM

## 2019-04-06 DIAGNOSIS — E7849 Other hyperlipidemia: Secondary | ICD-10-CM

## 2019-04-06 DIAGNOSIS — I10 Essential (primary) hypertension: Secondary | ICD-10-CM

## 2019-04-06 LAB — UA/M W/RFLX CULTURE, ROUTINE
Bilirubin, UA: NEGATIVE
Glucose, UA: NEGATIVE
Ketones, UA: NEGATIVE
Leukocytes,UA: NEGATIVE
Nitrite, UA: NEGATIVE
Protein,UA: NEGATIVE
RBC, UA: NEGATIVE
Specific Gravity, UA: 1.02 (ref 1.005–1.030)
Urobilinogen, Ur: 0.2 mg/dL (ref 0.2–1.0)
pH, UA: 7 (ref 5.0–7.5)

## 2019-04-06 LAB — MICROALBUMIN, URINE WAIVED
Creatinine, Urine Waived: 200 mg/dL (ref 10–300)
Microalb, Ur Waived: 10 mg/L (ref 0–19)
Microalb/Creat Ratio: <30 mg/g (ref <30)

## 2019-04-06 MED ORDER — HYDROCHLOROTHIAZIDE 25 MG PO TABS: 25.0000 mg | ORAL_TABLET | Freq: Every day | ORAL | 1 refills | Status: DC

## 2019-04-06 MED ORDER — LOSARTAN POTASSIUM 100 MG PO TABS: 100.0000 mg | ORAL_TABLET | Freq: Every day | ORAL | 1 refills | Status: DC

## 2019-04-06 MED ORDER — AMLODIPINE BESYLATE 5 MG PO TABS: 5.0000 mg | ORAL_TABLET | Freq: Every day | ORAL | 1 refills | Status: DC

## 2019-04-06 MED ORDER — MELOXICAM 15 MG PO TABS: 15.0000 mg | ORAL_TABLET | Freq: Every day | ORAL | 1 refills | Status: DC

## 2019-04-06 MED ORDER — OMEPRAZOLE 40 MG PO CPDR: 40.0000 mg | DELAYED_RELEASE_CAPSULE | Freq: Every day | ORAL | 1 refills | Status: DC

## 2019-04-06 MED ORDER — TAMSULOSIN HCL 0.4 MG PO CAPS: 0.4000 mg | ORAL_CAPSULE | Freq: Every day | ORAL | 1 refills | Status: DC

## 2019-04-06 NOTE — Assessment & Plan Note (Signed)
Rechecking levels today. Await results and treat as needed. Call with any concerns.   

## 2019-04-06 NOTE — Assessment & Plan Note (Signed)
Under good control on recheck. Continue current regimen. Continue to monitor. Call with any concerns. Refills given. Labs checked today.

## 2019-04-06 NOTE — Assessment & Plan Note (Signed)
Rechecking levels today. Await results. Treat as needed. Call with any concerns.  

## 2019-04-06 NOTE — Assessment & Plan Note (Signed)
Under good control on current regimen. Continue current regimen. Continue to monitor. Call with any concerns. Refills given. Labs drawn today.   

## 2019-04-06 NOTE — Patient Instructions (Addendum)
Preventative Services:  Health Risk Assessment and Personalized Prevention Plan: Done today Bone Mass Measurements: N/A CVD Screening: Done today Colon Cancer Screening: up to date Depression Screening: Done today Diabetes Screening: Done today Glaucoma Screening: See your Eye Doctor  Hepatitis B vaccine: N/A Hepatitis C screening: up to date HIV Screening: up to date Flu Vaccine: up to date Lung cancer Screening: N/A Obesity Screening: Done today Pneumonia Vaccines (2): Done today STI Screening: N/A PSA screening: Done today   Health Maintenance After Age 27 After age 20, you are at a higher risk for certain long-term diseases and infections as well as injuries from falls. Falls are a major cause of broken bones and head injuries in people who are older than age 41. Getting regular preventive care can help to keep you healthy and well. Preventive care includes getting regular testing and making lifestyle changes as recommended by your health care provider. Talk with your health care provider about:  Which screenings and tests you should have. A screening is a test that checks for a disease when you have no symptoms.  A diet and exercise plan that is right for you. What should I know about screenings and tests to prevent falls? Screening and testing are the best ways to find a health problem early. Early diagnosis and treatment give you the best chance of managing medical conditions that are common after age 76. Certain conditions and lifestyle choices may make you more likely to have a fall. Your health care provider may recommend:  Regular vision checks. Poor vision and conditions such as cataracts can make you more likely to have a fall. If you wear glasses, make sure to get your prescription updated if your vision changes.  Medicine review. Work with your health care provider to regularly review all of the medicines you are taking, including over-the-counter medicines. Ask your  health care provider about any side effects that may make you more likely to have a fall. Tell your health care provider if any medicines that you take make you feel dizzy or sleepy.  Osteoporosis screening. Osteoporosis is a condition that causes the bones to get weaker. This can make the bones weak and cause them to break more easily.  Blood pressure screening. Blood pressure changes and medicines to control blood pressure can make you feel dizzy.  Strength and balance checks. Your health care provider may recommend certain tests to check your strength and balance while standing, walking, or changing positions.  Foot health exam. Foot pain and numbness, as well as not wearing proper footwear, can make you more likely to have a fall.  Depression screening. You may be more likely to have a fall if you have a fear of falling, feel emotionally low, or feel unable to do activities that you used to do.  Alcohol use screening. Using too much alcohol can affect your balance and may make you more likely to have a fall. What actions can I take to lower my risk of falls? General instructions  Talk with your health care provider about your risks for falling. Tell your health care provider if: ? You fall. Be sure to tell your health care provider about all falls, even ones that seem minor. ? You feel dizzy, sleepy, or off-balance.  Take over-the-counter and prescription medicines only as told by your health care provider. These include any supplements.  Eat a healthy diet and maintain a healthy weight. A healthy diet includes low-fat dairy products, low-fat (lean) meats, and  fiber from whole grains, beans, and lots of fruits and vegetables. Home safety  Remove any tripping hazards, such as rugs, cords, and clutter.  Install safety equipment such as grab bars in bathrooms and safety rails on stairs.  Keep rooms and walkways well-lit. Activity   Follow a regular exercise program to stay fit. This  will help you maintain your balance. Ask your health care provider what types of exercise are appropriate for you.  If you need a cane or walker, use it as recommended by your health care provider.  Wear supportive shoes that have nonskid soles. Lifestyle  Do not drink alcohol if your health care provider tells you not to drink.  If you drink alcohol, limit how much you have: ? 0-1 drink a day for women. ? 0-2 drinks a day for men.  Be aware of how much alcohol is in your drink. In the [place] one drink equals one typical bottle of beer (12 oz), one-half glass of wine (5 oz), or one shot of hard liquor (1 oz).  Do not use any products that contain nicotine or tobacco, such as cigarettes and e-cigarettes. If you need help quitting, ask your health care provider. Summary  Having a healthy lifestyle and getting preventive care can help to protect your health and wellness after age 18.  Screening and testing are the best way to find a health problem early and help you avoid having a fall. Early diagnosis and treatment give you the best chance for managing medical conditions that are more common for people who are older than age 27.  Falls are a major cause of broken bones and head injuries in people who are older than age 27. Take precautions to prevent a fall at home.  Work with your health care provider to learn what changes you can make to improve your health and wellness and to prevent falls. This information is not intended to replace advice given to you by your health care provider. Make sure you discuss any questions you have with your health care provider. Document Released: 02/26/2017 Document Revised: 08/06/2018 Document Reviewed: 02/26/2017 Elsevier Patient Education  Elim. Pneumococcal Polysaccharide Vaccine (PPSV23): What You Need to Know 1. Why get vaccinated? Pneumococcal polysaccharide vaccine (PPSV23) can prevent pneumococcal disease. Pneumococcal disease  refers to any illness caused by pneumococcal bacteria. These bacteria can cause many types of illnesses, including pneumonia, which is an infection of the lungs. Pneumococcal bacteria are one of the most common causes of pneumonia. Besides pneumonia, pneumococcal bacteria can also cause:  Ear infections  Sinus infections  Meningitis (infection of the tissue covering the brain and spinal cord)  Bacteremia (bloodstream infection) Anyone can get pneumococcal disease, but children under 8 years of age, people with certain medical conditions, adults 13 years or older, and cigarette smokers are at the highest risk. Most pneumococcal infections are mild. However, some can result in long-term problems, such as brain damage or hearing loss. Meningitis, bacteremia, and pneumonia caused by pneumococcal disease can be fatal. 2. PPSV23 PPSV23 protects against 23 types of bacteria that cause pneumococcal disease. PPSV23 is recommended for:  All adults 38 years or older,  Anyone 2 years or older with certain medical conditions that can lead to an increased risk for pneumococcal disease. Most people need only one dose of PPSV23. A second dose of PPSV23, and another type of pneumococcal vaccine called PCV13, are recommended for certain high-risk groups. Your health care provider can give you more information. People  79 years or older should get a dose of PPSV23 even if they have already gotten one or more doses of the vaccine before they turned 31. 3. Talk with your health care provider Tell your vaccine provider if the person getting the vaccine:  Has had an allergic reaction after a previous dose of PPSV23, or has any severe, life-threatening allergies. In some cases, your health care provider may decide to postpone PPSV23 vaccination to a future visit. People with minor illnesses, such as a cold, may be vaccinated. People who are moderately or severely ill should usually wait until they recover before  getting PPSV23. Your health care provider can give you more information. 4. Risks of a vaccine reaction  Redness or pain where the shot is given, feeling tired, fever, or muscle aches can happen after PPSV23. People sometimes faint after medical procedures, including vaccination. Tell your provider if you feel dizzy or have vision changes or ringing in the ears. As with any medicine, there is a very remote chance of a vaccine causing a severe allergic reaction, other serious injury, or death. 5. What if there is a serious problem? An allergic reaction could occur after the vaccinated person leaves the clinic. If you see signs of a severe allergic reaction (hives, swelling of the face and throat, difficulty breathing, a fast heartbeat, dizziness, or weakness), call 9-1-1 and get the person to the nearest hospital. For other signs that concern you, call your health care provider. Adverse reactions should be reported to the Vaccine Adverse Event Reporting System (VAERS). Your health care provider will usually file this report, or you can do it yourself. Visit the VAERS website at www.vaers.SamedayNews.es or call 636-753-5709. VAERS is only for reporting reactions, and VAERS staff do not give medical advice. 6. How can I learn more?  Ask your health care provider.  Call your local or state health department.  Contact the Centers for Disease Control and Prevention (CDC): ? Call 563 658 6449 (1-800-CDC-INFO) or ? Visit CDC's website at http://hunter.com/ CDC Vaccine Information Statement PPSV23 Vaccine (02/25/2018) This information is not intended to replace advice given to you by your health care provider. Make sure you discuss any questions you have with your health care provider. Document Released: 02/10/2006 Document Revised: 08/04/2018 Document Reviewed: 11/25/2017 Elsevier Patient Education  2020 [employer].

## 2019-04-06 NOTE — Progress Notes (Signed)
BP (!) 148/89   Pulse 80   Temp 98.2 F (36.8 C)   Ht 5' 8.2" (1.732 m)   Wt 213 lb (96.6 kg)   SpO2 98%   BMI 32.20 kg/m    Subjective:    Patient ID: Arthur Richardson, male    DOB: 02/12/1951, 68 y.o.   MRN: [id number]  HPI: Arthur Richardson is a 68 y.o. male presenting on 04/06/2019 for comprehensive medical examination. Current medical complaints include:  HYPERTENSION / HYPERLIPIDEMIA Satisfied with current treatment? yes Duration of hypertension: chronic BP monitoring frequency: not checking BP medication side effects: no Past BP meds: losartan, amlodipine, HCTZ Duration of hyperlipidemia: chronic Cholesterol medication side effects: no Cholesterol supplements: none Past cholesterol medications: none Medication compliance: good compliance Aspirin: yes Recent stressors: no Recurrent headaches: no Visual changes: no Palpitations: no Dyspnea: no Chest pain: no Lower extremity edema: no Dizzy/lightheaded: no  HYPOTHYROIDISM Thyroid control status:controlled Satisfied with current treatment? yes Medication side effects: no Medication compliance: excellent compliance Recent dose adjustment:no Fatigue: no Cold intolerance: no Heat intolerance: no Weight gain: no Weight loss: no Constipation: no Diarrhea/loose stools: no Palpitations: no Lower extremity edema: no Anxiety/depressed mood: no  BPH BPH status: stable Satisfied with current treatment?: yes Medication side effects: no Medication compliance: excellent compliance Duration: chronic Nocturia: 2-3x per night Urinary frequency:no Incomplete voiding: yes Urgency: no Weak urinary stream: no Straining to start stream: no Dysuria: no Onset: gradual Severity: mild  Interim Problems from his last visit: no  Functional Status Survey: Is the patient deaf or have difficulty hearing?: No Does the patient have difficulty seeing, even when wearing glasses/contacts?: No Does the patient have  difficulty concentrating, remembering, or making decisions?: No Does the patient have difficulty walking or climbing stairs?: No Does the patient have difficulty dressing or bathing?: No Does the patient have difficulty doing errands alone such as visiting a doctor's office or shopping?: No  FALL RISK: Fall Risk  04/06/2019 08/21/2017 02/20/2017 08/21/2016 02/01/2016  Falls in the past year? 0 No No No No  Number falls in past yr: 0 - - - -  Injury with Fall? 0 - - - -    Depression Screen Depression screen [facility] 2/9 04/06/2019 08/21/2017 02/20/2017 08/21/2016 02/01/2016  Decreased Interest 0 0 0 0 0  Down, Depressed, Hopeless 0 0 0 0 0  PHQ - 2 Score 0 0 0 0 0    Advanced Directives Declined  Past Medical History:  Past Medical History:  Diagnosis Date  . Hyperlipidemia   . Hypertension     Surgical History:  Past Surgical History:  Procedure Laterality Date  . LITHOTRIPSY  09/1998 & 07/29/1999   Due to nephrolithiasis  . PROSTATE SURGERY     Biopsy= Negative  . TONSILLECTOMY      Medications:  Current Outpatient Medications on File Prior to Visit  Medication Sig  . aspirin EC 81 MG tablet Take 81 mg by mouth daily.  Marland Kitchen levothyroxine (SYNTHROID, LEVOTHROID) 50 MCG tablet Take 1 tablet (50 mcg total) by mouth daily before breakfast.   No current facility-administered medications on file prior to visit.     Allergies:  Allergies  Allergen Reactions  . Penicillins     Social History:  Social History   Socioeconomic History  . Marital status: Married    Spouse name: Not on file  . Number of children: Not on file  . Years of education: Not on file  . Highest education level: Not on  file  Occupational History  . Not on file  Social Needs  . Financial resource strain: Not on file  . Food insecurity    Worry: Not on file    Inability: Not on file  . Transportation needs    Medical: Not on file    Non-medical: Not on file  Tobacco Use  . Smoking status: Former  Smoker    Types: Cigarettes    Quit date: 01/31/1980    Years since quitting: 39.2  . Smokeless tobacco: Current User    Types: Chew  Substance and Sexual Activity  . Alcohol use: Yes    Alcohol/week: 21.0 standard drinks    Types: 21 Cans of beer per week  . Drug use: No  . Sexual activity: Not on file  Lifestyle  . Physical activity    Days per week: Not on file    Minutes per session: Not on file  . Stress: Not on file  Relationships  . Social Herbalist on phone: Not on file    Gets together: Not on file    Attends religious service: Not on file    Active member of club or organization: Not on file    Attends meetings of clubs or organizations: Not on file    Relationship status: Not on file  . Intimate partner violence    Fear of current or ex partner: Not on file    Emotionally abused: Not on file    Physically abused: Not on file    Forced sexual activity: Not on file  Other Topics Concern  . Not on file  Social History Narrative  . Not on file   Social History   Tobacco Use  Smoking Status Former Smoker  . Types: Cigarettes  . Quit date: 01/31/1980  . Years since quitting: 39.2  Smokeless Tobacco Current User  . Types: Chew   Social History   Substance and Sexual Activity  Alcohol Use Yes  . Alcohol/week: 21.0 standard drinks  . Types: 21 Cans of beer per week    Family History:  Family History  Problem Relation Age of Onset  . Stroke Father   . Cancer Sister        Pancreatic Cancer    Past medical history, surgical history, medications, allergies, family history and social history reviewed with patient today and changes made to appropriate areas of the chart.   Review of Systems  Constitutional: Negative.   HENT: Negative.   Eyes: Negative.   Respiratory: Negative.   Cardiovascular: Negative.   Gastrointestinal: Negative.   Genitourinary: Negative.   Musculoskeletal: Negative.   Skin: Negative.   Neurological: Negative.    Endo/Heme/Allergies: Positive for environmental allergies. Negative for polydipsia. Bruises/bleeds easily.  Psychiatric/Behavioral: Negative.     All other ROS negative except what is listed above and in the HPI.      Objective:    BP (!) 148/89   Pulse 80   Temp 98.2 F (36.8 C)   Ht 5' 8.2" (1.732 m)   Wt 213 lb (96.6 kg)   SpO2 98%   BMI 32.20 kg/m   Wt Readings from Last 3 Encounters:  04/06/19 213 lb (96.6 kg)  02/23/18 201 lb 12.8 oz (91.5 kg)  08/21/17 198 lb 11.2 oz (90.1 kg)    Physical Exam Vitals signs and nursing note reviewed.  Constitutional:      General: He is not in acute distress.    Appearance: Normal appearance. He is  obese. He is not ill-appearing, toxic-appearing or diaphoretic.  HENT:     Head: Normocephalic and atraumatic.     Right Ear: Tympanic membrane, ear canal and external ear normal. There is no impacted cerumen.     Left Ear: Tympanic membrane, ear canal and external ear normal. There is no impacted cerumen.     Nose: Nose normal. No congestion or rhinorrhea.     Mouth/Throat:     Mouth: Mucous membranes are moist.     Pharynx: Oropharynx is clear. No oropharyngeal exudate or posterior oropharyngeal erythema.  Eyes:     General: No scleral icterus.       Right eye: No discharge.        Left eye: No discharge.     Extraocular Movements: Extraocular movements intact.     Conjunctiva/sclera: Conjunctivae normal.     Pupils: Pupils are equal, round, and reactive to light.  Neck:     Musculoskeletal: Normal range of motion and neck supple. No neck rigidity or muscular tenderness.     Vascular: No carotid bruit.  Cardiovascular:     Rate and Rhythm: Normal rate and regular rhythm.     Pulses: Normal pulses.     Heart sounds: No murmur. No friction rub. No gallop.   Pulmonary:     Effort: Pulmonary effort is normal. No respiratory distress.     Breath sounds: Normal breath sounds. No stridor. No wheezing, rhonchi or rales.  Chest:      Chest wall: No tenderness.  Abdominal:     General: Abdomen is flat. Bowel sounds are normal. There is no distension.     Palpations: Abdomen is soft. There is no mass.     Tenderness: There is no abdominal tenderness. There is no right CVA tenderness, left CVA tenderness, guarding or rebound.     Hernia: No hernia is present.  Genitourinary:    Comments: Genital exam deferred with shared decision making Musculoskeletal: Normal range of motion.        General: No swelling, tenderness, deformity or signs of injury.     Right lower leg: No edema.     Left lower leg: No edema.  Lymphadenopathy:     Cervical: No cervical adenopathy.  Skin:    General: Skin is warm and dry.     Capillary Refill: Capillary refill takes less than 2 seconds.     Coloration: Skin is not jaundiced or pale.     Findings: No bruising, erythema, lesion or rash.  Neurological:     General: No focal deficit present.     Mental Status: He is alert and oriented to person, place, and time.     Cranial Nerves: No cranial nerve deficit.     Sensory: No sensory deficit.     Motor: No weakness.     Coordination: Coordination normal.     Gait: Gait normal.     Deep Tendon Reflexes: Reflexes normal.  Psychiatric:        Mood and Affect: Mood normal.        Behavior: Behavior normal.        Thought Content: Thought content normal.        Judgment: Judgment normal.     6CIT Screen 04/06/2019  What Year? 0 points  What month? 0 points  What time? 0 points  Count back from 20 0 points  Months in reverse 0 points  Repeat phrase 2 points  Total Score 2     Results for  orders placed or performed in visit on 04/06/19  HM COLONOSCOPY  Result Value Ref Range   [facility] Colonoscopy See Report (in chart) See Report (in chart), Patient Reported      Assessment & Plan:   Problem List Items Addressed This Visit      Cardiovascular and Mediastinum   Hypertension    Under good control on recheck. Continue current regimen.  Continue to monitor. Call with any concerns. Refills given. Labs checked today.      Relevant Medications   amLODipine (NORVASC) 5 MG tablet   hydrochlorothiazide (HYDRODIURIL) 25 MG tablet   losartan (COZAAR) 100 MG tablet   Other Relevant Orders   Comp Met (CMET)   Microalbumin, Urine Waived     Digestive   Barrett's esophagus with esophagitis    Under good control on current regimen. Continue current regimen. Continue to monitor. Call with any concerns. Refills given. Labs drawn today.       Relevant Medications   omeprazole (PRILOSEC) 40 MG capsule   Other Relevant Orders   CBC with Differential OUT   Comp Met (CMET)     Endocrine   Hypothyroidism    Rechecking levels today. Await results and treat as needed. Call with any concerns.       Relevant Orders   Comp Met (CMET)   TSH     Genitourinary   BPH with obstruction/lower urinary tract symptoms    Under good control on current regimen. Continue current regimen. Continue to monitor. Call with any concerns. Refills given. Labs drawn today.       Relevant Medications   tamsulosin (FLOMAX) 0.4 MG CAPS capsule   Other Relevant Orders   Comp Met (CMET)   PSA   UA/M w/rflx Culture, Routine     Other   Hyperlipidemia    Rechecking levels today. Await results. Treat as needed. Call with any concerns.       Relevant Medications   amLODipine (NORVASC) 5 MG tablet   hydrochlorothiazide (HYDRODIURIL) 25 MG tablet   losartan (COZAAR) 100 MG tablet   Other Relevant Orders   Comp Met (CMET)   Lipid Panel w/o Chol/HDL Ratio OUT    Other Visit Diagnoses    Wellness examination    -  Primary   Preventative care discussed as below. Call with any concerns.    Routine general medical examination at a health care facility       Vaccines updated. Screening labs checked today. Colonoscopy up to date. Continue diet and exercise. Call with any concerns.       Preventative Services:  Health Risk Assessment and  Personalized Prevention Plan: Done today Bone Mass Measurements: N/A CVD Screening: Done today Colon Cancer Screening: up to date Depression Screening: Done today Diabetes Screening: Done today Glaucoma Screening: See your Eye Doctor  Hepatitis B vaccine: N/A Hepatitis C screening: up to date HIV Screening: up to date Flu Vaccine: up to date Lung cancer Screening: N/A Obesity Screening: Done today Pneumonia Vaccines (2): Done today STI Screening: N/A PSA screening: Done today  Discussed aspirin prophylaxis for myocardial infarction prevention and decision was made to continue ASA  LABORATORY TESTING:  Health maintenance labs ordered today as discussed above.   The natural history of prostate cancer and ongoing controversy regarding screening and potential treatment outcomes of prostate cancer has been discussed with the patient. The meaning of a false positive PSA and a false negative PSA has been discussed. He indicates understanding of the limitations of this  screening test and wishes to proceed with screening PSA testing.   IMMUNIZATIONS:   - Tdap: Tetanus vaccination status reviewed: last tetanus booster within 10 years. - Influenza: Up to date - Pneumovax: Administered today - Prevnar: Up to date  SCREENING: - Colonoscopy: Up to date  Discussed with patient purpose of the colonoscopy is to detect colon cancer at curable precancerous or early stages   PATIENT COUNSELING:    Sexuality: Discussed sexually transmitted diseases, partner selection, use of condoms, avoidance of unintended pregnancy  and contraceptive alternatives.   Advised to avoid cigarette smoking.  I discussed with the patient that most people either abstain from alcohol or drink within safe limits (<=14/week and <=4 drinks/occasion for males, <=7/weeks and <= 3 drinks/occasion for females) and that the risk for alcohol disorders and other health effects rises proportionally with the number of drinks per  week and how often a drinker exceeds daily limits.  Discussed cessation/primary prevention of drug use and availability of treatment for abuse.   Diet: Encouraged to adjust caloric intake to maintain  or achieve ideal body weight, to reduce intake of dietary saturated fat and total fat, to limit sodium intake by avoiding high sodium foods and not adding table salt, and to maintain adequate dietary potassium and calcium preferably from fresh fruits, vegetables, and low-fat dairy products.    stressed the importance of regular exercise  Injury prevention: Discussed safety belts, safety helmets, smoke detector, smoking near bedding or upholstery.   Dental health: Discussed importance of regular tooth brushing, flossing, and dental visits.   Follow up plan: NEXT PREVENTATIVE PHYSICAL DUE IN 1 YEAR. Return in about 6 months (around 10/05/2019).

## 2019-04-07 LAB — CBC WITH DIFFERENTIAL/PLATELET
Basophils Absolute: 0.1 10*3/uL (ref 0.0–0.2)
Basos: 1 %
EOS (ABSOLUTE): 0.1 10*3/uL (ref 0.0–0.4)
Eos: 2 %
Hematocrit: 49.6 % (ref 37.5–51.0)
Hemoglobin: 17.1 g/dL (ref 13.0–17.7)
Immature Grans (Abs): 0 10*3/uL (ref 0.0–0.1)
Immature Granulocytes: 0 %
Lymphocytes Absolute: 1.9 10*3/uL (ref 0.7–3.1)
Lymphs: 32 %
MCH: 31.4 pg (ref 26.6–33.0)
MCHC: 34.5 g/dL (ref 31.5–35.7)
MCV: 91 fL (ref 79–97)
Monocytes Absolute: 0.6 10*3/uL (ref 0.1–0.9)
Monocytes: 11 %
Neutrophils Absolute: 3.1 10*3/uL (ref 1.4–7.0)
Neutrophils: 54 %
Platelets: 214 10*3/uL (ref 150–450)
RBC: 5.44 x10E6/uL (ref 4.14–5.80)
RDW: 12.4 % (ref 11.6–15.4)
WBC: 5.8 10*3/uL (ref 3.4–10.8)

## 2019-04-07 LAB — COMPREHENSIVE METABOLIC PANEL
ALT: 49 IU/L — ABNORMAL HIGH (ref 0–44)
AST: 35 IU/L (ref 0–40)
Albumin/Globulin Ratio: 1.7 (ref 1.2–2.2)
Albumin: 4.5 g/dL (ref 3.8–4.8)
Alkaline Phosphatase: 131 IU/L — ABNORMAL HIGH (ref 39–117)
BUN/Creatinine Ratio: 10 (ref 10–24)
BUN: 11 mg/dL (ref 8–27)
Bilirubin Total: 0.7 mg/dL (ref 0.0–1.2)
CO2: 22 mmol/L (ref 20–29)
Calcium: 10 mg/dL (ref 8.6–10.2)
Chloride: 94 mmol/L — ABNORMAL LOW (ref 96–106)
Creatinine, Ser: 1.08 mg/dL (ref 0.76–1.27)
GFR calc Af Amer: 80 mL/min/{1.73_m2} (ref >59)
GFR calc non Af Amer: 69 mL/min/{1.73_m2} (ref >59)
Globulin, Total: 2.7 g/dL (ref 1.5–4.5)
Glucose: 103 mg/dL — ABNORMAL HIGH (ref 65–99)
Potassium: 4.4 mmol/L (ref 3.5–5.2)
Sodium: 134 mmol/L (ref 134–144)
Total Protein: 7.2 g/dL (ref 6.0–8.5)

## 2019-04-07 LAB — LIPID PANEL W/O CHOL/HDL RATIO
Cholesterol, Total: 179 mg/dL (ref 100–199)
HDL: 56 mg/dL (ref >39)
LDL Chol Calc (NIH): 111 mg/dL — ABNORMAL HIGH (ref 0–99)
Triglycerides: 62 mg/dL (ref 0–149)
VLDL Cholesterol Cal: 12 mg/dL (ref 5–40)

## 2019-04-07 LAB — TSH: TSH: 1.92 u[IU]/mL (ref 0.450–4.500)

## 2019-04-07 LAB — PSA: Prostate Specific Ag, Serum: 3 ng/mL (ref 0.0–4.0)

## 2019-04-08 ENCOUNTER — Encounter: Payer: Self-pay | Admitting: Family Medicine

## 2019-04-09 ENCOUNTER — Other Ambulatory Visit: Payer: Self-pay

## 2019-04-09 DIAGNOSIS — E039 Hypothyroidism, unspecified: Secondary | ICD-10-CM

## 2019-04-09 MED ORDER — LEVOTHYROXINE SODIUM 50 MCG PO TABS: 50.0000 ug | ORAL_TABLET | Freq: Every day | ORAL | 3 refills | Status: DC

## 2019-04-09 NOTE — Telephone Encounter (Signed)
Patient last seen 04/06/19 and has appointment 10/08/19

## 2019-05-06 ENCOUNTER — Telehealth: Payer: Self-pay | Admitting: Family Medicine

## 2019-05-06 NOTE — Chronic Care Management (AMB) (Signed)
  Chronic Care Management   Outreach Note  05/06/2019 Name: Arthur Richardson MRN: [id number] DOB: 15-Aug-1950  Referred by: Guadalupe Maple, MD Reason for referral : Chronic Care Management ([place] outreach was unsuccessful. )   An unsuccessful telephone outreach was attempted today. The patient was referred to the case management team by for assistance with care management and care coordination.   Follow Up Plan: A HIPPA compliant phone message was left for the patient providing contact information and requesting a return call.  The care management team will reach out to the patient again over the next 7 days.  If patient returns call to provider office, please advise to call [place] at [place] [employer]  [place], [place] [address] Direct Dial: [email or web address]  Website: [email or web address]

## 2019-05-06 NOTE — Chronic Care Management (AMB) (Signed)
  Chronic Care Management   Note  05/06/2019 Name: Arthur Richardson MRN: [id number] DOB: [birth year]  Arthur Richardson is a 69 y.o. year old male who is a primary care patient of Crissman, Jeannette How, MD. I reached out to [name] by phone today in response to a referral sent by Arthur Richardson's health plan.     Arthur Richardson was given information about Chronic Care Management services today including:  1. CCM service includes personalized support from designated clinical staff supervised by his physician, including individualized plan of care and coordination with other care providers 2. 24/7 contact phone numbers for assistance for urgent and routine care needs. 3. Service will only be billed when office clinical staff spend 20 minutes or more in a month to coordinate care. 4. Only one practitioner may furnish and bill the service in a calendar month. 5. The patient may stop CCM services at any time (effective at the end of the month) by phone call to the office staff. 6. The patient will be responsible for cost sharing (co-pay) of up to 20% of the service fee (after annual deductible is met).  Patient did not agree to enrollment in care management services and does not wish to consider at this time.  Follow up plan: The patient has been provided with contact information for the care management team and has been advised to call with any health related questions or concerns.   [place], [place] [address] Direct Dial: [email or web address]  Website: [email or web address]

## 2019-05-25 ENCOUNTER — Encounter: Payer: Self-pay | Admitting: Family Medicine

## 2019-06-22 ENCOUNTER — Telehealth: Payer: Self-pay | Admitting: Family Medicine

## 2019-06-22 NOTE — Telephone Encounter (Signed)
Copied from [place] [phone]. Topic: General - Other >> Jun 22, 2019 11:18 AM Keene Breath wrote: Reason for CRM: Patient would like the nurse to call him regarding a change in doctor.  CB# [phone]

## 2019-06-25 NOTE — Telephone Encounter (Signed)
Contacted patient.  [place] [employer] made an error and that it has now been resolved.

## 2019-09-06 ENCOUNTER — Other Ambulatory Visit: Payer: Self-pay | Admitting: Family Medicine

## 2019-09-06 DIAGNOSIS — I10 Essential (primary) hypertension: Secondary | ICD-10-CM

## 2019-10-08 ENCOUNTER — Ambulatory Visit (INDEPENDENT_AMBULATORY_CARE_PROVIDER_SITE_OTHER): Payer: Medicare Other | Admitting: Family Medicine

## 2019-10-08 ENCOUNTER — Encounter: Payer: Self-pay | Admitting: Family Medicine

## 2019-10-08 ENCOUNTER — Other Ambulatory Visit: Payer: Self-pay

## 2019-10-08 VITALS — BP 146/82 | HR 80 | Temp 98.4°F | Ht 68.2 in | Wt 209.4 lb

## 2019-10-08 DIAGNOSIS — K2270 Barrett's esophagus without dysplasia: Secondary | ICD-10-CM

## 2019-10-08 DIAGNOSIS — N401 Enlarged prostate with lower urinary tract symptoms: Secondary | ICD-10-CM

## 2019-10-08 DIAGNOSIS — N138 Other obstructive and reflux uropathy: Secondary | ICD-10-CM

## 2019-10-08 DIAGNOSIS — K209 Esophagitis, unspecified without bleeding: Secondary | ICD-10-CM

## 2019-10-08 DIAGNOSIS — E039 Hypothyroidism, unspecified: Secondary | ICD-10-CM

## 2019-10-08 DIAGNOSIS — I10 Essential (primary) hypertension: Secondary | ICD-10-CM | POA: Diagnosis not present

## 2019-10-08 DIAGNOSIS — E7849 Other hyperlipidemia: Secondary | ICD-10-CM

## 2019-10-08 MED ORDER — OMEPRAZOLE 40 MG PO CPDR: 40.0000 mg | DELAYED_RELEASE_CAPSULE | Freq: Every day | ORAL | 1 refills | Status: DC

## 2019-10-08 MED ORDER — AMLODIPINE BESYLATE 5 MG PO TABS: 5.0000 mg | ORAL_TABLET | Freq: Every day | ORAL | 1 refills | Status: DC

## 2019-10-08 MED ORDER — TAMSULOSIN HCL 0.4 MG PO CAPS: 0.4000 mg | ORAL_CAPSULE | Freq: Every day | ORAL | 1 refills | Status: DC

## 2019-10-08 NOTE — Assessment & Plan Note (Signed)
Rechecking labs today. Await results. Treat as needed.  °

## 2019-10-08 NOTE — Assessment & Plan Note (Signed)
Running a little high. Will work on [employer]. Continue to monitor at home. Recheck next visit.

## 2019-10-08 NOTE — Progress Notes (Signed)
BP (!) 146/82 (BP Location: Left Arm, Cuff Size: Normal)   Pulse 80   Temp 98.4 F (36.9 C)   Ht 5' 8.2" (1.732 m)   Wt 209 lb 6 oz (95 kg)   SpO2 98%   BMI 31.65 kg/m    Subjective:    Patient ID: Arthur Richardson, male    DOB: 1950/06/27, 69 y.o.   MRN: [id number]  HPI: Arthur Richardson is a 68 y.o. male  Chief Complaint  Patient presents with  . Hypertension  . Hyperlipidemia   HYPERTENSION / HYPERLIPIDEMIA Satisfied with current treatment? yes Duration of hypertension: chronic BP monitoring frequency: rarely BP medication side effects: no Past BP meds: losartan, HCTZ, amlodipine Duration of hyperlipidemia: chronic Cholesterol medication side effects: not on anything Cholesterol supplements: none Past cholesterol medications: none Medication compliance: excellent compliance Aspirin: yes Recent stressors: no Recurrent headaches: no Visual changes: no Palpitations: no Dyspnea: no Chest pain: no Lower extremity edema: no Dizzy/lightheaded: no  HYPOTHYROIDISM Thyroid control status:controlled Satisfied with current treatment? yes Medication side effects: yes Medication compliance: excellent compliance Recent dose adjustment:no Fatigue: no Cold intolerance: no Heat intolerance: no Weight gain: no Weight loss: no Constipation: no Diarrhea/loose stools: no Palpitations: no Lower extremity edema: no Anxiety/depressed mood: no  Relevant past medical, surgical, family and social history reviewed and updated as indicated. Interim medical history since our last visit reviewed. Allergies and medications reviewed and updated.  Review of Systems  Constitutional: Negative.   Respiratory: Negative.   Cardiovascular: Negative.   Gastrointestinal: Negative.   Musculoskeletal: Negative.   Psychiatric/Behavioral: Negative.     Per HPI unless specifically indicated above     Objective:    BP (!) 146/82 (BP Location: Left Arm, Cuff Size: Normal)    Pulse 80   Temp 98.4 F (36.9 C)   Ht 5' 8.2" (1.732 m)   Wt 209 lb 6 oz (95 kg)   SpO2 98%   BMI 31.65 kg/m   Wt Readings from Last 3 Encounters:  10/08/19 209 lb 6 oz (95 kg)  04/06/19 213 lb (96.6 kg)  02/23/18 201 lb 12.8 oz (91.5 kg)    Physical Exam Vitals and nursing note reviewed.  Constitutional:      General: He is not in acute distress.    Appearance: Normal appearance. He is not ill-appearing, toxic-appearing or diaphoretic.  HENT:     Head: Normocephalic and atraumatic.     Right Ear: External ear normal.     Left Ear: External ear normal.     Nose: Nose normal.     Mouth/Throat:     Mouth: Mucous membranes are moist.     Pharynx: Oropharynx is clear.  Eyes:     General: No scleral icterus.       Right eye: No discharge.        Left eye: No discharge.     Extraocular Movements: Extraocular movements intact.     Conjunctiva/sclera: Conjunctivae normal.     Pupils: Pupils are equal, round, and reactive to light.  Cardiovascular:     Rate and Rhythm: Normal rate and regular rhythm.     Pulses: Normal pulses.     Heart sounds: Normal heart sounds. No murmur heard.  No friction rub. No gallop.   Pulmonary:     Effort: Pulmonary effort is normal. No respiratory distress.     Breath sounds: Normal breath sounds. No stridor. No wheezing, rhonchi or rales.  Chest:     Chest wall:  No tenderness.  Musculoskeletal:        General: Normal range of motion.     Cervical back: Normal range of motion and neck supple.  Skin:    General: Skin is warm and dry.     Capillary Refill: Capillary refill takes less than 2 seconds.     Coloration: Skin is not jaundiced or pale.     Findings: No bruising, erythema, lesion or rash.  Neurological:     General: No focal deficit present.     Mental Status: He is alert and oriented to person, place, and time. Mental status is at baseline.  Psychiatric:        Mood and Affect: Mood normal.        Behavior: Behavior normal.         Thought Content: Thought content normal.        Judgment: Judgment normal.     Results for orders placed or performed in visit on 04/06/19  CBC with Differential OUT  Result Value Ref Range   WBC 5.8 3.4 - 10.8 x10E3/uL   RBC 5.44 4.14 - 5.80 x10E6/uL   Hemoglobin 17.1 13.0 - 17.7 g/dL   Hematocrit 49.6 37.5 - 51.0 %   MCV 91 79 - 97 fL   MCH 31.4 26.6 - 33.0 pg   MCHC 34.5 31 - 35 g/dL   RDW 12.4 11.6 - 15.4 %   Platelets 214 150 - 450 x10E3/uL   Neutrophils 54 Not Estab. %   Lymphs 32 Not Estab. %   Monocytes 11 Not Estab. %   Eos 2 Not Estab. %   Basos 1 Not Estab. %   Neutrophils Absolute 3.1 1 - 7 x10E3/uL   Lymphocytes Absolute 1.9 0 - 3 x10E3/uL   Monocytes Absolute 0.6 0 - 0 x10E3/uL   EOS (ABSOLUTE) 0.1 0.0 - 0.4 x10E3/uL   Basophils Absolute 0.1 0 - 0 x10E3/uL   Immature Granulocytes 0 Not Estab. %   Immature Grans (Abs) 0.0 0.0 - 0.1 x10E3/uL  Comp Met (CMET)  Result Value Ref Range   Glucose 103 (H) 65 - 99 mg/dL   BUN 11 8 - 27 mg/dL   Creatinine, Ser 1.08 0.76 - 1.27 mg/dL   GFR calc non Af Amer 69 >59 mL/min/1.73   GFR calc Af Amer 80 >59 mL/min/1.73   BUN/Creatinine Ratio 10 10 - 24   Sodium 134 134 - 144 mmol/L   Potassium 4.4 3.5 - 5.2 mmol/L   Chloride 94 (L) 96 - 106 mmol/L   CO2 22 20 - 29 mmol/L   Calcium 10.0 8.6 - 10.2 mg/dL   Total Protein 7.2 6.0 - 8.5 g/dL   Albumin 4.5 3.8 - 4.8 g/dL   Globulin, Total 2.7 1.5 - 4.5 g/dL   Albumin/Globulin Ratio 1.7 1.2 - 2.2   Bilirubin Total 0.7 0.0 - 1.2 mg/dL   Alkaline Phosphatase 131 (H) 39 - 117 IU/L   AST 35 0 - 40 IU/L   ALT 49 (H) 0 - 44 IU/L  Lipid Panel w/o Chol/HDL Ratio OUT  Result Value Ref Range   Cholesterol, Total 179 100 - 199 mg/dL   Triglycerides 62 0 - 149 mg/dL   HDL 56 >39 mg/dL   VLDL Cholesterol Cal 12 5 - 40 mg/dL   LDL Chol Calc (NIH) 111 (H) 0 - 99 mg/dL  Microalbumin, Urine Waived  Result Value Ref Range   Microalb, Ur Waived 10 0 - 19 mg/L   Creatinine,  Urine Waived  200 10 - 300 mg/dL   Microalb/Creat Ratio <30 <30 mg/g  PSA  Result Value Ref Range   Prostate Specific Ag, Serum 3.0 0.0 - 4.0 ng/mL  TSH  Result Value Ref Range   TSH 1.920 0.450 - 4.500 uIU/mL  UA/M w/rflx Culture, Routine   Specimen: Urine   URINE  Result Value Ref Range   Specific Gravity, UA 1.020 1.005 - 1.030   pH, UA 7.0 5.0 - 7.5   Color, UA Yellow Yellow   Appearance Ur Clear Clear   Leukocytes,UA Negative Negative   Protein,UA Negative Negative/Trace   Glucose, UA Negative Negative   Ketones, UA Negative Negative   RBC, UA Negative Negative   Bilirubin, UA Negative Negative   Urobilinogen, Ur 0.2 0.2 - 1.0 mg/dL   Nitrite, UA Negative Negative  HM COLONOSCOPY  Result Value Ref Range   HM Colonoscopy See Report (in chart) See Report (in chart), Patient Reported      Assessment & Plan:   Problem List Items Addressed This Visit      Cardiovascular and Mediastinum   Hypertension - Primary    Running a little high. Will work on [employer]. Continue to monitor at home. Recheck next visit.       Relevant Medications   amLODipine (NORVASC) 5 MG tablet   Other Relevant Orders   Comprehensive metabolic panel     Digestive   Barrett's esophagus with esophagitis   Relevant Medications   omeprazole (PRILOSEC) 40 MG capsule     Endocrine   Hypothyroidism    Rechecking labs today. Await results. Treat as needed.       Relevant Orders   Comprehensive metabolic panel   TSH     Genitourinary   BPH with obstruction/lower urinary tract symptoms    Under good control on current regimen. Continue current regimen. Continue to monitor. Call with any concerns. Refills given.        Relevant Medications   tamsulosin (FLOMAX) 0.4 MG CAPS capsule     Other   Hyperlipidemia    Rechecking labs today. Await results. Treat as needed.       Relevant Medications   amLODipine (NORVASC) 5 MG tablet   Other Relevant Orders   Comprehensive metabolic panel   Lipid Panel  w/o Chol/HDL Ratio       Follow up plan: Return in about 6 months (around 04/08/2020) for Wellness/physical.

## 2019-10-08 NOTE — Assessment & Plan Note (Signed)
Under good control on current regimen. Continue current regimen. Continue to monitor. Call with any concerns. Refills given.   

## 2019-10-09 LAB — COMPREHENSIVE METABOLIC PANEL
ALT: 42 IU/L (ref 0–44)
AST: 30 IU/L (ref 0–40)
Albumin/Globulin Ratio: 1.8 (ref 1.2–2.2)
Albumin: 4.4 g/dL (ref 3.8–4.8)
Alkaline Phosphatase: 120 IU/L (ref 48–121)
BUN/Creatinine Ratio: 15 (ref 10–24)
BUN: 15 mg/dL (ref 8–27)
Bilirubin Total: 0.5 mg/dL (ref 0.0–1.2)
CO2: 22 mmol/L (ref 20–29)
Calcium: 9.5 mg/dL (ref 8.6–10.2)
Chloride: 94 mmol/L — ABNORMAL LOW (ref 96–106)
Creatinine, Ser: 1 mg/dL (ref 0.76–1.27)
GFR calc Af Amer: 88 mL/min/{1.73_m2} (ref >59)
GFR calc non Af Amer: 76 mL/min/{1.73_m2} (ref >59)
Globulin, Total: 2.5 g/dL (ref 1.5–4.5)
Glucose: 98 mg/dL (ref 65–99)
Potassium: 4 mmol/L (ref 3.5–5.2)
Sodium: 136 mmol/L (ref 134–144)
Total Protein: 6.9 g/dL (ref 6.0–8.5)

## 2019-10-09 LAB — LIPID PANEL W/O CHOL/HDL RATIO
Cholesterol, Total: 180 mg/dL (ref 100–199)
HDL: 54 mg/dL (ref >39)
LDL Chol Calc (NIH): 108 mg/dL — ABNORMAL HIGH (ref 0–99)
Triglycerides: 98 mg/dL (ref 0–149)
VLDL Cholesterol Cal: 18 mg/dL (ref 5–40)

## 2019-10-09 LAB — TSH: TSH: 2.43 u[IU]/mL (ref 0.450–4.500)

## 2020-01-05 ENCOUNTER — Encounter: Payer: Self-pay | Admitting: Family Medicine

## 2020-01-24 ENCOUNTER — Ambulatory Visit (INDEPENDENT_AMBULATORY_CARE_PROVIDER_SITE_OTHER): Payer: Medicare Other

## 2020-01-24 ENCOUNTER — Other Ambulatory Visit: Payer: Self-pay

## 2020-01-24 DIAGNOSIS — Z23 Encounter for immunization: Secondary | ICD-10-CM | POA: Diagnosis not present

## 2020-02-10 ENCOUNTER — Other Ambulatory Visit: Payer: Self-pay | Admitting: Family Medicine

## 2020-02-10 DIAGNOSIS — K209 Esophagitis, unspecified without bleeding: Secondary | ICD-10-CM

## 2020-02-10 DIAGNOSIS — I10 Essential (primary) hypertension: Secondary | ICD-10-CM

## 2020-02-10 DIAGNOSIS — K2270 Barrett's esophagus without dysplasia: Secondary | ICD-10-CM

## 2020-02-15 ENCOUNTER — Ambulatory Visit (INDEPENDENT_AMBULATORY_CARE_PROVIDER_SITE_OTHER): Payer: Medicare Other | Admitting: Family Medicine

## 2020-02-15 ENCOUNTER — Encounter: Payer: Self-pay | Admitting: Family Medicine

## 2020-02-15 ENCOUNTER — Other Ambulatory Visit: Payer: Self-pay

## 2020-02-15 VITALS — BP 126/84 | HR 68 | Temp 98.0°F | Ht 68.2 in | Wt 212.0 lb

## 2020-02-15 DIAGNOSIS — B379 Candidiasis, unspecified: Secondary | ICD-10-CM | POA: Diagnosis not present

## 2020-02-15 MED ORDER — NYSTATIN-TRIAMCINOLONE 100000-0.1 UNIT/GM-% EX OINT: 1.0000 "application " | TOPICAL_OINTMENT | Freq: Two times a day (BID) | CUTANEOUS | 0 refills | Status: DC

## 2020-02-15 NOTE — Progress Notes (Signed)
BP 126/84   Pulse 68   Temp 98 F (36.7 C) (Oral)   Ht 5' 8.2" (1.732 m)   Wt 212 lb (96.2 kg)   SpO2 98%   BMI 32.05 kg/m    Subjective:    Patient ID: Arthur Richardson, male    DOB: 08/26/1950, 69 y.o.   MRN: [id number]  HPI: Arthur Richardson is a 69 y.o. male  Chief Complaint  Patient presents with  . Rash    Rash under left arm - not hurting but its itching. Been there for over a month. Using baby rash cream and it helps with the itching.   Marland Kitchen Hypertension    Follow up   RASH Duration:  2 months  Location: L arm  Itching: yes Burning: yes Redness: yes Oozing: no Scaling: yes Blisters: no Painful: no Fevers: no Change in detergents/soaps/personal care products: no Recent illness: no Recent travel:no History of same: no Context: stable Alleviating factors: nothing Treatments attempted:lotion/moisturizer Shortness of breath: no  Throat/tongue swelling: no Myalgias/arthralgias: no   Relevant past medical, surgical, family and social history reviewed and updated as indicated. Interim medical history since our last visit reviewed. Allergies and medications reviewed and updated.  Review of Systems  Constitutional: Negative.   Respiratory: Negative.   Cardiovascular: Negative.   Gastrointestinal: Negative.   Musculoskeletal: Negative.   Skin: Positive for rash. Negative for color change, pallor and wound.  Psychiatric/Behavioral: Negative.     Per HPI unless specifically indicated above     Objective:    BP 126/84   Pulse 68   Temp 98 F (36.7 C) (Oral)   Ht 5' 8.2" (1.732 m)   Wt 212 lb (96.2 kg)   SpO2 98%   BMI 32.05 kg/m   Wt Readings from Last 3 Encounters:  02/15/20 212 lb (96.2 kg)  10/08/19 209 lb 6 oz (95 kg)  04/06/19 213 lb (96.6 kg)    Physical Exam Vitals and nursing note reviewed.  Constitutional:      General: He is not in acute distress.    Appearance: Normal appearance. He is not ill-appearing, toxic-appearing or  diaphoretic.  HENT:     Head: Normocephalic and atraumatic.     Right Ear: External ear normal.     Left Ear: External ear normal.     Nose: Nose normal.     Mouth/Throat:     Mouth: Mucous membranes are moist.     Pharynx: Oropharynx is clear.  Eyes:     General: No scleral icterus.       Right eye: No discharge.        Left eye: No discharge.     Extraocular Movements: Extraocular movements intact.     Conjunctiva/sclera: Conjunctivae normal.     Pupils: Pupils are equal, round, and reactive to light.  Cardiovascular:     Rate and Rhythm: Normal rate and regular rhythm.     Pulses: Normal pulses.     Heart sounds: Normal heart sounds. No murmur heard.  No friction rub. No gallop.   Pulmonary:     Effort: Pulmonary effort is normal. No respiratory distress.     Breath sounds: Normal breath sounds. No stridor. No wheezing, rhonchi or rales.  Chest:     Chest wall: No tenderness.  Musculoskeletal:        General: Normal range of motion.     Cervical back: Normal range of motion and neck supple.  Skin:    General: Skin  is warm and dry.     Capillary Refill: Capillary refill takes less than 2 seconds.     Coloration: Skin is not jaundiced or pale.     Findings: No bruising, erythema, lesion or rash.     Comments: Well demarcated erythematous rash under L arm with scaling around the edges  Neurological:     General: No focal deficit present.     Mental Status: He is alert and oriented to person, place, and time. Mental status is at baseline.  Psychiatric:        Mood and Affect: Mood normal.        Behavior: Behavior normal.        Thought Content: Thought content normal.        Judgment: Judgment normal.     Results for orders placed or performed in visit on 10/08/19  Comprehensive metabolic panel  Result Value Ref Range   Glucose 98 65 - 99 mg/dL   BUN 15 8 - 27 mg/dL   Creatinine, Ser 1.00 0.76 - 1.27 mg/dL   GFR calc non Af Amer 76 >59 mL/min/1.73   GFR calc Af  Amer 88 >59 mL/min/1.73   BUN/Creatinine Ratio 15 10 - 24   Sodium 136 134 - 144 mmol/L   Potassium 4.0 3.5 - 5.2 mmol/L   Chloride 94 (L) 96 - 106 mmol/L   CO2 22 20 - 29 mmol/L   Calcium 9.5 8.6 - 10.2 mg/dL   Total Protein 6.9 6.0 - 8.5 g/dL   Albumin 4.4 3.8 - 4.8 g/dL   Globulin, Total 2.5 1.5 - 4.5 g/dL   Albumin/Globulin Ratio 1.8 1.2 - 2.2   Bilirubin Total 0.5 0.0 - 1.2 mg/dL   Alkaline Phosphatase 120 48 - 121 IU/L   AST 30 0 - 40 IU/L   ALT 42 0 - 44 IU/L  Lipid Panel w/o Chol/HDL Ratio  Result Value Ref Range   Cholesterol, Total 180 100 - 199 mg/dL   Triglycerides 98 0 - 149 mg/dL   HDL 54 >39 mg/dL   VLDL Cholesterol Cal 18 5 - 40 mg/dL   LDL Chol Calc (NIH) 108 (H) 0 - 99 mg/dL  TSH  Result Value Ref Range   TSH 2.430 0.450 - 4.500 uIU/mL      Assessment & Plan:   Problem List Items Addressed This Visit    None    Visit Diagnoses    Candida infection    -  Primary   will treat with nystatin-triamcinalone. Call if not getting better or getting worse.    Relevant Medications   nystatin-triamcinolone ointment (MYCOLOG)       Follow up plan: No follow-ups on file.

## 2020-02-18 ENCOUNTER — Telehealth: Payer: Self-pay

## 2020-02-18 NOTE — Telephone Encounter (Signed)
PA approved.

## 2020-02-18 NOTE — Telephone Encounter (Signed)
PA for Nystatin-Triamcinolone ointment initiated and submitted via Cover My Meds. Key: [id number]

## 2020-02-24 ENCOUNTER — Other Ambulatory Visit: Payer: Self-pay

## 2020-02-24 DIAGNOSIS — N138 Other obstructive and reflux uropathy: Secondary | ICD-10-CM

## 2020-02-28 MED ORDER — TAMSULOSIN HCL 0.4 MG PO CAPS: 0.4000 mg | ORAL_CAPSULE | Freq: Every day | ORAL | 0 refills | Status: DC

## 2020-03-28 ENCOUNTER — Telehealth: Payer: Self-pay | Admitting: Family Medicine

## 2020-03-28 NOTE — Telephone Encounter (Signed)
Copied from [place] [id number]. Topic: Medicare AWV >> Mar 28, 2020 11:31 AM Cher Nakai R wrote: Reason for CRM:  No answer unable to leave  message for patient to call back and schedule the Medicare Annual Wellness Visit (AWV) virtually.  Last AWV 04/06/2019   Please schedule at anytime with [facility] Health Advisor.  45 minute appointment  Any questions, please call me at [phone]

## 2020-04-07 ENCOUNTER — Ambulatory Visit (INDEPENDENT_AMBULATORY_CARE_PROVIDER_SITE_OTHER): Payer: Medicare Other

## 2020-04-07 VITALS — Ht 68.0 in | Wt 208.0 lb

## 2020-04-07 DIAGNOSIS — Z0000 Encounter for general adult medical examination without abnormal findings: Secondary | ICD-10-CM

## 2020-04-07 NOTE — Patient Instructions (Signed)
Arthur Richardson , Thank you for taking time to come for your Medicare Wellness Visit. I appreciate your ongoing commitment to your health goals. Please review the following plan we discussed and let me know if I can assist you in the future.   Screening recommendations/referrals: Colonoscopy: completed 03/18/2017 Recommended yearly ophthalmology/optometry visit for glaucoma screening and checkup Recommended yearly dental visit for hygiene and checkup  Vaccinations: Influenza vaccine: completed 01/24/2020, due 11/27/2020 Pneumococcal vaccine: completed 04/06/2019 Tdap vaccine: completed 12/03/2010, due 12/02/2020 Shingles vaccine: discussed   Covid-19:  decline  Advanced directives: Advance directive discussed with you today.    Conditions/risks identified: chews tobacco  Next appointment: Follow up in one year for your annual wellness visit.   Preventive Care 71 Years and Older, Male Preventive care refers to lifestyle choices and visits with your health care provider that can promote health and wellness. What does preventive care include?  A yearly physical exam. This is also called an annual well check.  Dental exams once or twice a year.  Routine eye exams. Ask your health care provider how often you should have your eyes checked.  Personal lifestyle choices, including:  Daily care of your teeth and gums.  Regular physical activity.  Eating a healthy diet.  Avoiding tobacco and drug use.  Limiting alcohol use.  Practicing safe sex.  Taking low doses of aspirin every day.  Taking vitamin and mineral supplements as recommended by your health care provider. What happens during an annual well check? The services and screenings done by your health care provider during your annual well check will depend on your age, overall health, lifestyle risk factors, and family history of disease. Counseling  Your health care provider may ask you questions about your:  Alcohol  use.  Tobacco use.  Drug use.  Emotional well-being.  Home and relationship well-being.  Sexual activity.  Eating habits.  History of falls.  Memory and ability to understand (cognition).  Work and work Statistician. Screening  You may have the following tests or measurements:  Height, weight, and BMI.  Blood pressure.  Lipid and cholesterol levels. These may be checked every 5 years, or more frequently if you are over 71 years old.  Skin check.  Lung cancer screening. You may have this screening every year starting at age 30 if you have a 30-pack-year history of smoking and currently smoke or have quit within the past 15 years.  Fecal occult blood test (FOBT) of the stool. You may have this test every year starting at age 55.  Flexible sigmoidoscopy or colonoscopy. You may have a sigmoidoscopy every 5 years or a colonoscopy every 10 years starting at age 6.  Prostate cancer screening. Recommendations will vary depending on your family history and other risks.  Hepatitis C blood test.  Hepatitis B blood test.  Sexually transmitted disease (STD) testing.  Diabetes screening. This is done by checking your blood sugar (glucose) after you have not eaten for a while (fasting). You may have this done every 1-3 years.  Abdominal aortic aneurysm (AAA) screening. You may need this if you are a current or former smoker.  Osteoporosis. You may be screened starting at age 1 if you are at high risk. Talk with your health care provider about your test results, treatment options, and if necessary, the need for more tests. Vaccines  Your health care provider may recommend certain vaccines, such as:  Influenza vaccine. This is recommended every year.  Tetanus, diphtheria, and acellular pertussis (Tdap,  Td) vaccine. You may need a Td booster every 10 years.  Zoster vaccine. You may need this after age 41.  Pneumococcal 13-valent conjugate (PCV13) vaccine. One dose is  recommended after age 96.  Pneumococcal polysaccharide (PPSV23) vaccine. One dose is recommended after age 6. Talk to your health care provider about which screenings and vaccines you need and how often you need them. This information is not intended to replace advice given to you by your health care provider. Make sure you discuss any questions you have with your health care provider. Document Released: 05/12/2015 Document Revised: 01/03/2016 Document Reviewed: 02/14/2015 Elsevier Interactive Patient Education  2017 [place] Prevention in the Home Falls can cause injuries. They can happen to people of all ages. There are many things you can do to make your home safe and to help prevent falls. What can I do on the outside of my home?  Regularly fix the edges of walkways and driveways and fix any cracks.  Remove anything that might make you trip as you walk through a door, such as a raised step or threshold.  Trim any bushes or trees on the path to your home.  Use bright outdoor lighting.  Clear any walking paths of anything that might make someone trip, such as rocks or tools.  Regularly check to see if handrails are loose or broken. Make sure that both sides of any steps have handrails.  Any raised decks and porches should have guardrails on the edges.  Have any leaves, snow, or ice cleared regularly.  Use sand or salt on walking paths during winter.  Clean up any spills in your garage right away. This includes oil or grease spills. What can I do in the bathroom?  Use night lights.  Install grab bars by the toilet and in the tub and shower. Do not use towel bars as grab bars.  Use non-skid mats or decals in the tub or shower.  If you need to sit down in the shower, use a plastic, non-slip stool.  Keep the floor dry. Clean up any water that spills on the floor as soon as it happens.  Remove soap buildup in the tub or shower regularly.  Attach bath mats  securely with double-sided non-slip rug tape.  Do not have throw rugs and other things on the floor that can make you trip. What can I do in the bedroom?  Use night lights.  Make sure that you have a light by your bed that is easy to reach.  Do not use any sheets or blankets that are too big for your bed. They should not hang down onto the floor.  Have a firm chair that has side arms. You can use this for support while you get dressed.  Do not have throw rugs and other things on the floor that can make you trip. What can I do in the kitchen?  Clean up any spills right away.  Avoid walking on wet floors.  Keep items that you use a lot in easy-to-reach places.  If you need to reach something above you, use a strong step stool that has a grab bar.  Keep electrical cords out of the way.  Do not use floor polish or wax that makes floors slippery. If you must use wax, use non-skid floor wax.  Do not have throw rugs and other things on the floor that can make you trip. What can I do with my stairs?  Do not  leave any items on the stairs.  Make sure that there are handrails on both sides of the stairs and use them. Fix handrails that are broken or loose. Make sure that handrails are as long as the stairways.  Check any carpeting to make sure that it is firmly attached to the stairs. Fix any carpet that is loose or worn.  Avoid having throw rugs at the top or bottom of the stairs. If you do have throw rugs, attach them to the floor with carpet tape.  Make sure that you have a light switch at the top of the stairs and the bottom of the stairs. If you do not have them, ask someone to add them for you. What else can I do to help prevent falls?  Wear shoes that:  Do not have high heels.  Have rubber bottoms.  Are comfortable and fit you well.  Are closed at the toe. Do not wear sandals.  If you use a stepladder:  Make sure that it is fully opened. Do not climb a closed  stepladder.  Make sure that both sides of the stepladder are locked into place.  Ask someone to hold it for you, if possible.  Clearly mark and make sure that you can see:  Any grab bars or handrails.  First and last steps.  Where the edge of each step is.  Use tools that help you move around (mobility aids) if they are needed. These include:  Canes.  Walkers.  Scooters.  Crutches.  Turn on the lights when you go into a dark area. Replace any light bulbs as soon as they burn out.  Set up your furniture so you have a clear path. Avoid moving your furniture around.  If any of your floors are uneven, fix them.  If there are any pets around you, be aware of where they are.  Review your medicines with your doctor. Some medicines can make you feel dizzy. This can increase your chance of falling. Ask your doctor what other things that you can do to help prevent falls. This information is not intended to replace advice given to you by your health care provider. Make sure you discuss any questions you have with your health care provider. Document Released: 02/09/2009 Document Revised: 09/21/2015 Document Reviewed: 05/20/2014 Elsevier Interactive Patient Education  2017 [employer].

## 2020-04-07 NOTE — Progress Notes (Signed)
I connected with Arthur Richardson today by telephone and verified that I am speaking with the correct person using two identifiers. Location patient: home Location provider: work Persons participating in the virtual visit: Josephmichael Lisenbee, Glenna Durand LPN.   I discussed the limitations, risks, security and privacy concerns of performing an evaluation and management service by telephone and the availability of in person appointments. I also discussed with the patient that there may be a patient responsible charge related to this service. The patient expressed understanding and verbally consented to this telephonic visit.    Interactive audio and video telecommunications were attempted between this provider and patient, however failed, due to patient having technical difficulties OR patient did not have access to video capability.  We continued and completed visit with audio only.     Vital signs may be patient reported or missing  Subjective:   Arthur Richardson is a 69 y.o. male who presents for Medicare Annual/Subsequent preventive examination.  Review of Systems     Cardiac Risk Factors include: advanced age (>18men, >75 women);dyslipidemia;hypertension;male gender;obesity (BMI >30kg/m2);sedentary lifestyle;smoking/ tobacco exposure     Objective:    Today's Vitals   04/07/20 1342  Weight: 208 lb (94.3 kg)  Height: 5\' 8"  (1.727 m)   Body mass index is 31.63 kg/m.  Advanced Directives 04/07/2020  Does Patient Have a Medical Advance Directive? No    Current Medications (verified) Outpatient Encounter Medications as of 04/07/2020  Medication Sig  . amLODipine (NORVASC) 5 MG tablet TAKE 1 TABLET BY MOUTH  DAILY  . aspirin EC 81 MG tablet Take 81 mg by mouth daily.  . hydrochlorothiazide (HYDRODIURIL) 25 MG tablet TAKE 1 TABLET BY MOUTH  DAILY  . levothyroxine (SYNTHROID) 50 MCG tablet Take 1 tablet (50 mcg total) by mouth daily before breakfast.  . losartan (COZAAR) 100 MG  tablet TAKE 1 TABLET BY MOUTH  DAILY  . nystatin-triamcinolone ointment (MYCOLOG) Apply 1 application topically 2 (two) times daily.  Marland Kitchen omeprazole (PRILOSEC) 40 MG capsule TAKE 1 CAPSULE BY MOUTH  DAILY  . tamsulosin (FLOMAX) 0.4 MG CAPS capsule Take 1 capsule (0.4 mg total) by mouth daily.   No facility-administered encounter medications on file as of 04/07/2020.    Allergies (verified) Penicillins   History: Past Medical History:  Diagnosis Date  . Hyperlipidemia   . Hypertension    Past Surgical History:  Procedure Laterality Date  . LITHOTRIPSY  09/1998 & 07/29/1999   Due to nephrolithiasis  . PROSTATE SURGERY     Biopsy= Negative  . TONSILLECTOMY     Family History  Problem Relation Age of Onset  . Stroke Father   . Cancer Sister        Pancreatic Cancer   Social History   Socioeconomic History  . Marital status: Married    Spouse name: Not on file  . Number of children: Not on file  . Years of education: Not on file  . Highest education level: Not on file  Occupational History  . Not on file  Tobacco Use  . Smoking status: Former Smoker    Types: Cigarettes    Quit date: 01/31/1980    Years since quitting: 40.2  . Smokeless tobacco: Current User    Types: Chew  Vaping Use  . Vaping Use: Never used  Substance and Sexual Activity  . Alcohol use: Yes    Alcohol/week: 21.0 standard drinks    Types: 21 Cans of beer per week  . Drug use: No  .  Sexual activity: Not on file  Other Topics Concern  . Not on file  Social History Narrative  . Not on file   Social Determinants of Health   Financial Resource Strain: Low Risk   . Difficulty of Paying Living Expenses: Not hard at all  Food Insecurity: No Food Insecurity  . Worried About Charity fundraiser in the Last Year: Never true  . Ran Out of Food in the Last Year: Never true  Transportation Needs: No Transportation Needs  . Lack of Transportation (Medical): No  . Lack of Transportation (Non-Medical):  No  Physical Activity: Inactive  . Days of Exercise per Week: 0 days  . Minutes of Exercise per Session: 0 min  Stress: No Stress Concern Present  . Feeling of Stress : Not at all  Social Connections: Not on file    Tobacco Counseling Ready to quit: No Counseling given: Not Answered   Clinical Intake:  Pre-visit preparation completed: Yes  Pain : No/denies pain     Nutritional Status: BMI > 30  Obese Nutritional Risks: None Diabetes: No  How often do you need to have someone help you when you read instructions, pamphlets, or other written materials from your doctor or pharmacy?: 1 - Never What is the last grade level you completed in school?: 12th grade  Diabetic? no  Interpreter Needed?: No  Information entered by :: NAllen LPN   Activities of Daily Living In your present state of health, do you have any difficulty performing the following activities: 04/07/2020 02/15/2020  Hearing? N N  Vision? N N  Difficulty concentrating or making decisions? N N  Walking or climbing stairs? Y N  Comment due to hip and leg -  Dressing or bathing? N N  Doing errands, shopping? N N  Preparing Food and eating ? N -  Using the Toilet? N -  In the past six months, have you accidently leaked urine? Y -  Do you have problems with loss of bowel control? N -  Managing your Medications? N -  Managing your Finances? N -  Housekeeping or managing your Housekeeping? N -  Some recent data might be hidden    Patient Care Team: Valerie Roys, DO as PCP - General (Family Medicine)  Indicate any recent Medical Services you may have received from other than [facility] providers in the past year (date may be approximate).     Assessment:   This is a routine wellness examination for Arthur Richardson.  Hearing/Vision screen  Hearing Screening   125Hz  250Hz  500Hz  1000Hz  2000Hz  3000Hz  4000Hz  6000Hz  8000Hz   Right ear:           Left ear:           Vision Screening Comments: Regular eye exams,    Dietary issues and exercise activities discussed: Current Exercise Habits: The patient does not participate in regular exercise at present  Goals    . Patient Stated     04/07/2020, no goals      Depression Screen PHQ 2/9 Scores 04/07/2020 02/15/2020 04/06/2019 08/21/2017 02/20/2017 08/21/2016 02/01/2016  PHQ - 2 Score 0 0 0 0 0 0 0  PHQ- 9 Score - 0 - - - - -    Fall Risk Fall Risk  04/07/2020 02/15/2020 04/06/2019 08/21/2017 02/20/2017  Falls in the past year? 0 0 0 No No  Number falls in past yr: - 0 0 - -  Injury with Fall? - 0 0 - -  Risk for fall  due to : Medication side effect No Fall Risks - - -  Follow up Falls evaluation completed;Education provided;Falls prevention discussed Falls evaluation completed - - -    FALL RISK PREVENTION PERTAINING TO THE HOME:  Any stairs in or around the home? Yes  If so, are there any without handrails? No  Home free of loose throw rugs in walkways, pet beds, electrical cords, etc? Yes  Adequate lighting in your home to reduce risk of falls? Yes   ASSISTIVE DEVICES UTILIZED TO PREVENT FALLS:  Life alert? No  Use of a cane, walker or w/c? No  Grab bars in the bathroom? No  Shower chair or bench in shower? No  Elevated toilet seat or a handicapped toilet? No   TIMED UP AND GO:  Was the test performed? No .    Cognitive Function:     6CIT Screen 04/07/2020 04/06/2019  What Year? 0 points 0 points  What month? 0 points 0 points  What time? 0 points 0 points  Count back from 20 0 points 0 points  Months in reverse 0 points 0 points  Repeat phrase 6 points 2 points  Total Score 6 2    Immunizations Immunization History  Administered Date(s) Administered  . Fluad Quad(high Dose 65+) 01/11/2019, 01/24/2020  . Influenza, High Dose Seasonal PF 02/01/2016, 02/02/2018  . Influenza,inj,Quad PF,6+ Mos 01/31/2015  . Pneumococcal Conjugate-13 08/31/2014  . Pneumococcal Polysaccharide-23 04/06/2019  . Tdap 12/03/2010    TDAP  status: Up to date  Flu Vaccine status: Up to date  Pneumococcal vaccine status: Up to date  Covid-19 vaccine status: Declined, Education has been provided regarding the importance of this vaccine but patient still declined. Advised may receive this vaccine at local pharmacy or Health Dept.or vaccine clinic. Aware to provide a copy of the vaccination record if obtained from local pharmacy or Health Dept. Verbalized acceptance and understanding.  Qualifies for Shingles Vaccine? Yes   Zostavax completed No   Shingrix Completed?: No.    Education has been provided regarding the importance of this vaccine. Patient has been advised to call insurance company to determine out of pocket expense if they have not yet received this vaccine. Advised may also receive vaccine at local pharmacy or Health Dept. Verbalized acceptance and understanding.  Screening Tests Health Maintenance  Topic Date Due  . COVID-19 Vaccine (1) Never done  . TETANUS/TDAP  12/02/2020  . COLONOSCOPY  03/19/2027  . INFLUENZA VACCINE  Completed  . Hepatitis C Screening  Completed  . PNA vac Low Risk Adult  Completed    Health Maintenance  Health Maintenance Due  Topic Date Due  . COVID-19 Vaccine (1) Never done    Colorectal cancer screening: Type of screening: Colonoscopy. Completed 03/18/2017. Repeat every 10 years  Lung Cancer Screening: (Low Dose CT Chest recommended if Age 6-80 years, 30 pack-year currently smoking OR have quit w/in 15years.) does not qualify.   Lung Cancer Screening Referral: no  Additional Screening:  Hepatitis C Screening: does qualify; Completed 08/07/2015  Vision Screening: Recommended annual ophthalmology exams for early detection of glaucoma and other disorders of the eye. Is the patient up to date with their annual eye exam?  Yes  Who is the provider or what is the name of the office in which the patient attends annual eye exams? none If pt is not established with a provider, would  they like to be referred to a provider to establish care? No .   Dental Screening: Recommended  annual dental exams for proper oral hygiene  Community Resource Referral / Chronic Care Management: CRR required this visit?  No   CCM required this visit?  No      Plan:     I have personally reviewed and noted the following in the patient's chart:   . Medical and social history . Use of alcohol, tobacco or illicit drugs  . Current medications and supplements . Functional ability and status . Nutritional status . Physical activity . Advanced directives . List of other physicians . Hospitalizations, surgeries, and ER visits in previous 12 months . Vitals . Screenings to include cognitive, depression, and falls . Referrals and appointments  In addition, I have reviewed and discussed with patient certain preventive protocols, quality metrics, and best practice recommendations. A written personalized care plan for preventive services as well as general preventive health recommendations were provided to patient.     Kellie Simmering, LPN   [id number]   Nurse Notes:

## 2020-04-12 ENCOUNTER — Encounter: Payer: Medicare Other | Admitting: Family Medicine

## 2020-04-24 ENCOUNTER — Other Ambulatory Visit: Payer: Self-pay

## 2020-04-24 ENCOUNTER — Encounter: Payer: Self-pay | Admitting: Family Medicine

## 2020-04-24 ENCOUNTER — Ambulatory Visit (INDEPENDENT_AMBULATORY_CARE_PROVIDER_SITE_OTHER): Payer: Medicare Other | Admitting: Family Medicine

## 2020-04-24 VITALS — BP 127/77 | HR 67 | Temp 97.9°F | Ht 67.5 in | Wt 208.2 lb

## 2020-04-24 DIAGNOSIS — N200 Calculus of kidney: Secondary | ICD-10-CM | POA: Diagnosis not present

## 2020-04-24 DIAGNOSIS — K209 Esophagitis, unspecified without bleeding: Secondary | ICD-10-CM

## 2020-04-24 DIAGNOSIS — M25551 Pain in right hip: Secondary | ICD-10-CM

## 2020-04-24 DIAGNOSIS — K2270 Barrett's esophagus without dysplasia: Secondary | ICD-10-CM

## 2020-04-24 DIAGNOSIS — N138 Other obstructive and reflux uropathy: Secondary | ICD-10-CM

## 2020-04-24 DIAGNOSIS — E039 Hypothyroidism, unspecified: Secondary | ICD-10-CM | POA: Diagnosis not present

## 2020-04-24 DIAGNOSIS — Z0000 Encounter for general adult medical examination without abnormal findings: Secondary | ICD-10-CM | POA: Diagnosis not present

## 2020-04-24 DIAGNOSIS — R29898 Other symptoms and signs involving the musculoskeletal system: Secondary | ICD-10-CM

## 2020-04-24 DIAGNOSIS — I10 Essential (primary) hypertension: Secondary | ICD-10-CM

## 2020-04-24 DIAGNOSIS — N401 Enlarged prostate with lower urinary tract symptoms: Secondary | ICD-10-CM

## 2020-04-24 DIAGNOSIS — M25552 Pain in left hip: Secondary | ICD-10-CM

## 2020-04-24 DIAGNOSIS — E7849 Other hyperlipidemia: Secondary | ICD-10-CM

## 2020-04-24 LAB — URINALYSIS, ROUTINE W REFLEX MICROSCOPIC
Bilirubin, UA: NEGATIVE
Glucose, UA: NEGATIVE
Ketones, UA: NEGATIVE
Leukocytes,UA: NEGATIVE
Nitrite, UA: NEGATIVE
Protein,UA: NEGATIVE
RBC, UA: NEGATIVE
Specific Gravity, UA: 1.02 (ref 1.005–1.030)
Urobilinogen, Ur: 1 mg/dL (ref 0.2–1.0)
pH, UA: 7.5 (ref 5.0–7.5)

## 2020-04-24 MED ORDER — AMLODIPINE BESYLATE 5 MG PO TABS: 5.0000 mg | ORAL_TABLET | Freq: Every day | ORAL | 1 refills | Status: DC

## 2020-04-24 MED ORDER — TAMSULOSIN HCL 0.4 MG PO CAPS
0.4000 mg | ORAL_CAPSULE | Freq: Every day | ORAL | 0 refills | Status: DC
Start: 2020-04-24 — End: 2020-07-02

## 2020-04-24 MED ORDER — HYDROCHLOROTHIAZIDE 25 MG PO TABS: 25.0000 mg | ORAL_TABLET | Freq: Every day | ORAL | 1 refills | Status: DC

## 2020-04-24 MED ORDER — LOSARTAN POTASSIUM 100 MG PO TABS: 100.0000 mg | ORAL_TABLET | Freq: Every day | ORAL | 1 refills | Status: DC

## 2020-04-24 MED ORDER — OMEPRAZOLE 40 MG PO CPDR
40.0000 mg | DELAYED_RELEASE_CAPSULE | Freq: Every day | ORAL | 1 refills | Status: DC
Start: 2020-04-24 — End: 2020-10-15

## 2020-04-24 NOTE — Patient Instructions (Signed)
Health Maintenance After Age 71 After age 71, you are at a higher risk for certain long-term diseases and infections as well as injuries from falls. Falls are a major cause of broken bones and head injuries in people who are older than age 71. Getting regular preventive care can help to keep you healthy and well. Preventive care includes getting regular testing and making lifestyle changes as recommended by your health care provider. Talk with your health care provider about:  Which screenings and tests you should have. A screening is a test that checks for a disease when you have no symptoms.  A diet and exercise plan that is right for you. What should I know about screenings and tests to prevent falls? Screening and testing are the best ways to find a health problem early. Early diagnosis and treatment give you the best chance of managing medical conditions that are common after age 71. Certain conditions and lifestyle choices may make you more likely to have a fall. Your health care provider may recommend:  Regular vision checks. Poor vision and conditions such as cataracts can make you more likely to have a fall. If you wear glasses, make sure to get your prescription updated if your vision changes.  Medicine review. Work with your health care provider to regularly review all of the medicines you are taking, including over-the-counter medicines. Ask your health care provider about any side effects that may make you more likely to have a fall. Tell your health care provider if any medicines that you take make you feel dizzy or sleepy.  Osteoporosis screening. Osteoporosis is a condition that causes the bones to get weaker. This can make the bones weak and cause them to break more easily.  Blood pressure screening. Blood pressure changes and medicines to control blood pressure can make you feel dizzy.  Strength and balance checks. Your health care provider may recommend certain tests to check your  strength and balance while standing, walking, or changing positions.  Foot health exam. Foot pain and numbness, as well as not wearing proper footwear, can make you more likely to have a fall.  Depression screening. You may be more likely to have a fall if you have a fear of falling, feel emotionally low, or feel unable to do activities that you used to do.  Alcohol use screening. Using too much alcohol can affect your balance and may make you more likely to have a fall. What actions can I take to lower my risk of falls? General instructions  Talk with your health care provider about your risks for falling. Tell your health care provider if: ? You fall. Be sure to tell your health care provider about all falls, even ones that seem minor. ? You feel dizzy, sleepy, or off-balance.  Take over-the-counter and prescription medicines only as told by your health care provider. These include any supplements.  Eat a healthy diet and maintain a healthy weight. A healthy diet includes low-fat dairy products, low-fat (lean) meats, and fiber from whole grains, beans, and lots of fruits and vegetables. Home safety  Remove any tripping hazards, such as rugs, cords, and clutter.  Install safety equipment such as grab bars in bathrooms and safety rails on stairs.  Keep rooms and walkways well-lit. Activity   Follow a regular exercise program to stay fit. This will help you maintain your balance. Ask your health care provider what types of exercise are appropriate for you.  If you need a cane or   walker, use it as recommended by your health care provider.  Wear supportive shoes that have nonskid soles. Lifestyle  Do not drink alcohol if your health care provider tells you not to drink.  If you drink alcohol, limit how much you have: ? 0-1 drink a day for women. ? 0-2 drinks a day for men.  Be aware of how much alcohol is in your drink. In the [place] one drink equals one typical bottle of beer (12  oz), one-half glass of wine (5 oz), or one shot of hard liquor (1 oz).  Do not use any products that contain nicotine or tobacco, such as cigarettes and e-cigarettes. If you need help quitting, ask your health care provider. Summary  Having a healthy lifestyle and getting preventive care can help to protect your health and wellness after age 71.  Screening and testing are the best way to find a health problem early and help you avoid having a fall. Early diagnosis and treatment give you the best chance for managing medical conditions that are more common for people who are older than age 71.  Falls are a major cause of broken bones and head injuries in people who are older than age 71. Take precautions to prevent a fall at home.  Work with your health care provider to learn what changes you can make to improve your health and wellness and to prevent falls. This information is not intended to replace advice given to you by your health care provider. Make sure you discuss any questions you have with your health care provider. Document Revised: 08/06/2018 Document Reviewed: 02/26/2017 Elsevier Patient Education  2020 [employer]  

## 2020-04-24 NOTE — Assessment & Plan Note (Signed)
Under good control on current regimen. Continue current regimen. Continue to monitor. Call with any concerns. Refills given. Labs drawn today.   

## 2020-04-24 NOTE — Assessment & Plan Note (Signed)
Checking [facility] today. Await results.  

## 2020-04-24 NOTE — Progress Notes (Signed)
BP 127/77 (BP Location: Left Arm, Cuff Size: Normal)   Pulse 67   Temp 97.9 F (36.6 C) (Oral)   Ht 5' 7.5" (1.715 m)   Wt 208 lb 3.2 oz (94.4 kg)   SpO2 100%   BMI 32.13 kg/m    Subjective:    Patient ID: Arthur Richardson, male    DOB: 16-Sep-1950, 69 y.o.   MRN: [id number]  HPI: Arthur Richardson is a 69 y.o. male presenting on 04/24/2020 for comprehensive medical examination. Current medical complaints include:  HYPERTENSION / HYPERLIPIDEMIA Satisfied with current treatment? no Duration of hypertension: chronic BP monitoring frequency: not checking BP medication side effects: no Past BP meds: amlodipine, HCTZ, losartan Duration of hyperlipidemia: chronic Cholesterol medication side effects: not on anything Cholesterol supplements: none Past cholesterol medications: none Medication compliance: excellent compliance Aspirin: yes Recent stressors: no Recurrent headaches: no Visual changes: no Palpitations: no Dyspnea: no Chest pain: no Lower extremity edema: no Dizzy/lightheaded: no  HYPOTHYROIDISM Thyroid control status:controlled Satisfied with current treatment? yes Medication side effects: no Medication compliance: excellent compliance Recent dose adjustment:no Fatigue: no Cold intolerance: no Heat intolerance: no Weight gain: no Weight loss: no Constipation: no Diarrhea/loose stools: no Palpitations: no Lower extremity edema: no Anxiety/depressed mood: no  He currently lives with: wife Interim Problems from his last visit: no  Depression Screen done today and results listed below:  Depression screen [facility] 2/9 04/24/2020 04/07/2020 02/15/2020 04/06/2019 08/21/2017  Decreased Interest 0 0 0 0 0  Down, Depressed, Hopeless 0 0 0 0 0  PHQ - 2 Score 0 0 0 0 0  Altered sleeping - - 0 - -  Tired, decreased energy - - 0 - -  Change in appetite - - 0 - -  Feeling bad or failure about yourself  - - 0 - -  Trouble concentrating - - 0 - -  Moving slowly or  fidgety/restless - - 0 - -  Suicidal thoughts - - 0 - -  PHQ-9 Score - - 0 - -  Difficult doing work/chores - - Not difficult at all - -   Past Medical History:  Past Medical History:  Diagnosis Date  . Hyperlipidemia   . Hypertension     Surgical History:  Past Surgical History:  Procedure Laterality Date  . LITHOTRIPSY  09/1998 & 07/29/1999   Due to nephrolithiasis  . PROSTATE SURGERY     Biopsy= Negative  . TONSILLECTOMY      Medications:  Current Outpatient Medications on File Prior to Visit  Medication Sig  . aspirin EC 81 MG tablet Take 81 mg by mouth daily.  Marland Kitchen levothyroxine (SYNTHROID) 50 MCG tablet Take 1 tablet (50 mcg total) by mouth daily before breakfast.  . nystatin-triamcinolone ointment (MYCOLOG) Apply 1 application topically 2 (two) times daily.   No current facility-administered medications on file prior to visit.    Allergies:  Allergies  Allergen Reactions  . Penicillins     Social History:  Social History   Socioeconomic History  . Marital status: Married    Spouse name: Not on file  . Number of children: Not on file  . Years of education: Not on file  . Highest education level: Not on file  Occupational History  . Not on file  Tobacco Use  . Smoking status: Former Smoker    Types: Cigarettes    Quit date: 01/31/1980    Years since quitting: 40.2  . Smokeless tobacco: Current User    Types: Chew  Vaping Use  . Vaping Use: Never used  Substance and Sexual Activity  . Alcohol use: Yes    Alcohol/week: 21.0 standard drinks    Types: 21 Cans of beer per week  . Drug use: No  . Sexual activity: Yes  Other Topics Concern  . Not on file  Social History Narrative  . Not on file   Social Determinants of Health   Financial Resource Strain: Low Risk   . Difficulty of Paying Living Expenses: Not hard at all  Food Insecurity: No Food Insecurity  . Worried About Charity fundraiser in the Last Year: Never true  . Ran Out of Food in the  Last Year: Never true  Transportation Needs: No Transportation Needs  . Lack of Transportation (Medical): No  . Lack of Transportation (Non-Medical): No  Physical Activity: Inactive  . Days of Exercise per Week: 0 days  . Minutes of Exercise per Session: 0 min  Stress: No Stress Concern Present  . Feeling of Stress : Not at all  Social Connections: Not on file  Intimate Partner Violence: Not on file   Social History   Tobacco Use  Smoking Status Former Smoker  . Types: Cigarettes  . Quit date: 01/31/1980  . Years since quitting: 40.2  Smokeless Tobacco Current User  . Types: Chew   Social History   Substance and Sexual Activity  Alcohol Use Yes  . Alcohol/week: 21.0 standard drinks  . Types: 21 Cans of beer per week    Family History:  Family History  Problem Relation Age of Onset  . Stroke Father   . Cancer Sister        Pancreatic Cancer    Past medical history, surgical history, medications, allergies, family history and social history reviewed with patient today and changes made to appropriate areas of the chart.   Review of Systems  Constitutional: Negative.   HENT: Negative.   Eyes: Negative.   Respiratory: Negative.   Cardiovascular: Negative.   Gastrointestinal: Negative.   Genitourinary: Negative.   Musculoskeletal: Positive for joint pain. Negative for back pain, falls, myalgias and neck pain.  Skin: Negative.   Neurological: Negative.   Endo/Heme/Allergies: Negative.  Negative for environmental allergies and polydipsia. Does not bruise/bleed easily.  Psychiatric/Behavioral: Negative.     All other ROS negative except what is listed above and in the HPI.      Objective:    BP 127/77 (BP Location: Left Arm, Cuff Size: Normal)   Pulse 67   Temp 97.9 F (36.6 C) (Oral)   Ht 5' 7.5" (1.715 m)   Wt 208 lb 3.2 oz (94.4 kg)   SpO2 100%   BMI 32.13 kg/m   Wt Readings from Last 3 Encounters:  04/24/20 208 lb 3.2 oz (94.4 kg)  04/07/20 208 lb  (94.3 kg)  02/15/20 212 lb (96.2 kg)    Physical Exam Vitals and nursing note reviewed.  Constitutional:      General: He is not in acute distress.    Appearance: Normal appearance. He is obese. He is not ill-appearing, toxic-appearing or diaphoretic.  HENT:     Head: Normocephalic and atraumatic.     Right Ear: Tympanic membrane, ear canal and external ear normal. There is no impacted cerumen.     Left Ear: Tympanic membrane, ear canal and external ear normal. There is no impacted cerumen.     Nose: Nose normal. No congestion or rhinorrhea.     Mouth/Throat:     Mouth:  Mucous membranes are moist.     Pharynx: Oropharynx is clear. No oropharyngeal exudate or posterior oropharyngeal erythema.  Eyes:     General: No scleral icterus.       Right eye: No discharge.        Left eye: No discharge.     Extraocular Movements: Extraocular movements intact.     Conjunctiva/sclera: Conjunctivae normal.     Pupils: Pupils are equal, round, and reactive to light.  Neck:     Vascular: No carotid bruit.  Cardiovascular:     Rate and Rhythm: Normal rate and regular rhythm.     Pulses: Normal pulses.     Heart sounds: No murmur heard. No friction rub. No gallop.   Pulmonary:     Effort: Pulmonary effort is normal. No respiratory distress.     Breath sounds: Normal breath sounds. No stridor. No wheezing, rhonchi or rales.  Chest:     Chest wall: No tenderness.  Abdominal:     General: Abdomen is flat. Bowel sounds are normal. There is no distension.     Palpations: Abdomen is soft. There is no mass.     Tenderness: There is no abdominal tenderness. There is no right CVA tenderness, left CVA tenderness, guarding or rebound.     Hernia: No hernia is present.  Genitourinary:    Comments: Genital exam deferred with shared decision making Musculoskeletal:        General: No swelling, tenderness, deformity or signs of injury.     Cervical back: Normal range of motion and neck supple. No rigidity.  No muscular tenderness.     Right lower leg: No edema.     Left lower leg: No edema.  Lymphadenopathy:     Cervical: No cervical adenopathy.  Skin:    General: Skin is warm and dry.     Capillary Refill: Capillary refill takes less than 2 seconds.     Coloration: Skin is not jaundiced or pale.     Findings: No bruising, erythema, lesion or rash.  Neurological:     General: No focal deficit present.     Mental Status: He is alert and oriented to person, place, and time.     Cranial Nerves: No cranial nerve deficit.     Sensory: No sensory deficit.     Motor: Weakness (L arm and L arm) present.     Coordination: Coordination normal.     Gait: Gait normal.     Deep Tendon Reflexes: Reflexes normal.  Psychiatric:        Mood and Affect: Mood normal.        Behavior: Behavior normal.        Thought Content: Thought content normal.        Judgment: Judgment normal.     Results for orders placed or performed in visit on 10/08/19  Comprehensive metabolic panel  Result Value Ref Range   Glucose 98 65 - 99 mg/dL   BUN 15 8 - 27 mg/dL   Creatinine, Ser 1.01 0.76 - 1.27 mg/dL   GFR calc non Af Amer 76 >59 mL/min/1.73   GFR calc Af Amer 88 >59 mL/min/1.73   BUN/Creatinine Ratio 15 10 - 24   Sodium 136 134 - 144 mmol/L   Potassium 4.0 3.5 - 5.2 mmol/L   Chloride 94 (L) 96 - 106 mmol/L   CO2 22 20 - 29 mmol/L   Calcium 9.5 8.6 - 10.2 mg/dL   Total Protein 6.9 6.0 - 8.5 g/dL  Albumin 4.4 3.8 - 4.8 g/dL   Globulin, Total 2.5 1.5 - 4.5 g/dL   Albumin/Globulin Ratio 1.8 1.2 - 2.2   Bilirubin Total 0.5 0.0 - 1.2 mg/dL   Alkaline Phosphatase 120 48 - 121 IU/L   AST 30 0 - 40 IU/L   ALT 42 0 - 44 IU/L  Lipid Panel w/o Chol/HDL Ratio  Result Value Ref Range   Cholesterol, Total 180 100 - 199 mg/dL   Triglycerides 98 0 - 149 mg/dL   HDL 54 >39 mg/dL   VLDL Cholesterol Cal 18 5 - 40 mg/dL   LDL Chol Calc (NIH) 108 (H) 0 - 99 mg/dL  TSH  Result Value Ref Range   TSH 2.430 0.450 -  4.500 uIU/mL      Assessment & Plan:   Problem List Items Addressed This Visit      Cardiovascular and Mediastinum   Hypertension    Under good control on current regimen. Continue current regimen. Continue to monitor. Call with any concerns. Refills given. Labs drawn today.       Relevant Medications   amLODipine (NORVASC) 5 MG tablet   hydrochlorothiazide (HYDRODIURIL) 25 MG tablet   losartan (COZAAR) 100 MG tablet   Other Relevant Orders   Comprehensive metabolic panel   CBC with Differential/Platelet     Digestive   Barrett's esophagus with esophagitis   Relevant Medications   omeprazole (PRILOSEC) 40 MG capsule     Endocrine   Hypothyroidism    Stable. Rechecking labs today. Await results. Treat as needed. Call with any concerns.       Relevant Orders   Comprehensive metabolic panel   CBC with Differential/Platelet   TSH     Genitourinary   Nephrolithiasis    Checking UA today. Await results.       Relevant Medications   hydrochlorothiazide (HYDRODIURIL) 25 MG tablet   Other Relevant Orders   Comprehensive metabolic panel   CBC with Differential/Platelet   Urinalysis, Routine w reflex microscopic   BPH with obstruction/lower urinary tract symptoms    Under good control on current regimen. Continue current regimen. Continue to monitor. Call with any concerns. Refills given. Labs drawn today.       Relevant Medications   tamsulosin (FLOMAX) 0.4 MG CAPS capsule   Other Relevant Orders   Comprehensive metabolic panel   CBC with Differential/Platelet   PSA     Other   Hyperlipidemia    Under good control on current regimen. Continue current regimen. Continue to monitor. Call with any concerns. Refills given. Labs drawn today.       Relevant Medications   amLODipine (NORVASC) 5 MG tablet   hydrochlorothiazide (HYDRODIURIL) 25 MG tablet   losartan (COZAAR) 100 MG tablet   Other Relevant Orders   Comprehensive metabolic panel   CBC with  Differential/Platelet   Lipid Panel w/o Chol/HDL Ratio    Other Visit Diagnoses    Routine general medical examination at a health care facility    -  Primary   Vaccines up to date/declined. Screening labs checked today. Colonoscopy up to date. Continue diet and exercise. Call with any concerns.   Bilateral hip pain       Would like to see ortho. Referral generated today.   Relevant Orders   Ambulatory referral to Orthopedic Surgery   Essential hypertension       Relevant Medications   amLODipine (NORVASC) 5 MG tablet   hydrochlorothiazide (HYDRODIURIL) 25 MG tablet   losartan (COZAAR)  100 MG tablet   Left arm weakness       Offered MRI head vs x-ray neck and PT- he would like to see ortho first. If no sign of radiculopathy on neck x-ray will need MRI brain to r/o previous CVA.       Discussed aspirin prophylaxis for myocardial infarction prevention and decision was made to continue ASA  LABORATORY TESTING:  Health maintenance labs ordered today as discussed above.   The natural history of prostate cancer and ongoing controversy regarding screening and potential treatment outcomes of prostate cancer has been discussed with the patient. The meaning of a false positive PSA and a false negative PSA has been discussed. He indicates understanding of the limitations of this screening test and wishes to proceed with screening PSA testing.   IMMUNIZATIONS:   - Tdap: Tetanus vaccination status reviewed: last tetanus booster within 10 years. - Influenza: Up to date - Pneumovax: Up to date - Prevnar: Up to date - COVID: Refused  SCREENING: - Colonoscopy: Up to date  Discussed with patient purpose of the colonoscopy is to detect colon cancer at curable precancerous or early stages   PATIENT COUNSELING:    Sexuality: Discussed sexually transmitted diseases, partner selection, use of condoms, avoidance of unintended pregnancy  and contraceptive alternatives.   Advised to avoid cigarette  smoking.  I discussed with the patient that most people either abstain from alcohol or drink within safe limits (<=14/week and <=4 drinks/occasion for males, <=7/weeks and <= 3 drinks/occasion for females) and that the risk for alcohol disorders and other health effects rises proportionally with the number of drinks per week and how often a drinker exceeds daily limits.  Discussed cessation/primary prevention of drug use and availability of treatment for abuse.   Diet: Encouraged to adjust caloric intake to maintain  or achieve ideal body weight, to reduce intake of dietary saturated fat and total fat, to limit sodium intake by avoiding high sodium foods and not adding table salt, and to maintain adequate dietary potassium and calcium preferably from fresh fruits, vegetables, and low-fat dairy products.   Stressed the importance of regular exercise  Injury prevention: Discussed safety belts, safety helmets, smoke detector, smoking near bedding or upholstery.   Dental health: Discussed importance of regular tooth brushing, flossing, and dental visits.   Follow up plan: NEXT PREVENTATIVE PHYSICAL DUE IN 1 YEAR. Return in about 6 months (around 10/23/2020).

## 2020-04-24 NOTE — Assessment & Plan Note (Signed)
Stable. Rechecking labs today. Await results. Treat as needed. Call with any concerns.

## 2020-04-25 ENCOUNTER — Encounter: Payer: Self-pay | Admitting: Family Medicine

## 2020-04-25 LAB — COMPREHENSIVE METABOLIC PANEL
ALT: 42 IU/L (ref 0–44)
AST: 34 IU/L (ref 0–40)
Albumin/Globulin Ratio: 1.8 (ref 1.2–2.2)
Albumin: 4.6 g/dL (ref 3.7–4.7)
Alkaline Phosphatase: 124 IU/L — ABNORMAL HIGH (ref 44–121)
BUN/Creatinine Ratio: 16 (ref 10–24)
BUN: 16 mg/dL (ref 8–27)
Bilirubin Total: 0.6 mg/dL (ref 0.0–1.2)
CO2: 24 mmol/L (ref 20–29)
Calcium: 10 mg/dL (ref 8.6–10.2)
Chloride: 97 mmol/L (ref 96–106)
Creatinine, Ser: 1.01 mg/dL (ref 0.76–1.27)
GFR calc Af Amer: 86 mL/min/{1.73_m2} (ref >59)
GFR calc non Af Amer: 74 mL/min/{1.73_m2} (ref >59)
Globulin, Total: 2.6 g/dL (ref 1.5–4.5)
Glucose: 100 mg/dL — ABNORMAL HIGH (ref 65–99)
Potassium: 4.4 mmol/L (ref 3.5–5.2)
Sodium: 136 mmol/L (ref 134–144)
Total Protein: 7.2 g/dL (ref 6.0–8.5)

## 2020-04-25 LAB — CBC WITH DIFFERENTIAL/PLATELET
Basophils Absolute: 0.1 10*3/uL (ref 0.0–0.2)
Basos: 1 %
EOS (ABSOLUTE): 0.6 10*3/uL — ABNORMAL HIGH (ref 0.0–0.4)
Eos: 8 %
Hematocrit: 49.9 % (ref 37.5–51.0)
Hemoglobin: 17.2 g/dL (ref 13.0–17.7)
Immature Grans (Abs): 0 10*3/uL (ref 0.0–0.1)
Immature Granulocytes: 0 %
Lymphocytes Absolute: 2.6 10*3/uL (ref 0.7–3.1)
Lymphs: 37 %
MCH: 32.5 pg (ref 26.6–33.0)
MCHC: 34.5 g/dL (ref 31.5–35.7)
MCV: 94 fL (ref 79–97)
Monocytes Absolute: 0.7 10*3/uL (ref 0.1–0.9)
Monocytes: 10 %
Neutrophils Absolute: 3.1 10*3/uL (ref 1.4–7.0)
Neutrophils: 44 %
Platelets: 216 10*3/uL (ref 150–450)
RBC: 5.3 x10E6/uL (ref 4.14–5.80)
RDW: 11.8 % (ref 11.6–15.4)
WBC: 7.1 10*3/uL (ref 3.4–10.8)

## 2020-04-25 LAB — TSH: TSH: 1.94 u[IU]/mL (ref 0.450–4.500)

## 2020-04-25 LAB — LIPID PANEL W/O CHOL/HDL RATIO
Cholesterol, Total: 197 mg/dL (ref 100–199)
HDL: 56 mg/dL (ref >39)
LDL Chol Calc (NIH): 127 mg/dL — ABNORMAL HIGH (ref 0–99)
Triglycerides: 77 mg/dL (ref 0–149)
VLDL Cholesterol Cal: 14 mg/dL (ref 5–40)

## 2020-04-25 LAB — PSA: Prostate Specific Ag, Serum: 3 ng/mL (ref 0.0–4.0)

## 2020-04-26 ENCOUNTER — Other Ambulatory Visit: Payer: Self-pay | Admitting: Family Medicine

## 2020-04-26 DIAGNOSIS — E039 Hypothyroidism, unspecified: Secondary | ICD-10-CM

## 2020-04-26 NOTE — Telephone Encounter (Signed)
Patient seen 04/24/20

## 2020-05-10 DIAGNOSIS — G8929 Other chronic pain: Secondary | ICD-10-CM | POA: Diagnosis not present

## 2020-05-10 DIAGNOSIS — M5441 Lumbago with sciatica, right side: Secondary | ICD-10-CM | POA: Diagnosis not present

## 2020-05-10 DIAGNOSIS — M5442 Lumbago with sciatica, left side: Secondary | ICD-10-CM | POA: Diagnosis not present

## 2020-05-10 DIAGNOSIS — M545 Low back pain, unspecified: Secondary | ICD-10-CM | POA: Diagnosis not present

## 2020-06-08 ENCOUNTER — Encounter: Payer: Self-pay | Admitting: Nurse Practitioner

## 2020-06-08 ENCOUNTER — Telehealth: Payer: Self-pay

## 2020-06-08 ENCOUNTER — Ambulatory Visit (INDEPENDENT_AMBULATORY_CARE_PROVIDER_SITE_OTHER): Payer: Medicare Other | Admitting: Nurse Practitioner

## 2020-06-08 ENCOUNTER — Other Ambulatory Visit: Payer: Self-pay

## 2020-06-08 VITALS — BP 105/71 | HR 80 | Temp 97.8°F | Wt 191.8 lb

## 2020-06-08 DIAGNOSIS — E039 Hypothyroidism, unspecified: Secondary | ICD-10-CM | POA: Diagnosis not present

## 2020-06-08 DIAGNOSIS — R7989 Other specified abnormal findings of blood chemistry: Secondary | ICD-10-CM | POA: Diagnosis not present

## 2020-06-08 DIAGNOSIS — R0609 Other forms of dyspnea: Secondary | ICD-10-CM | POA: Insufficient documentation

## 2020-06-08 DIAGNOSIS — R0600 Dyspnea, unspecified: Secondary | ICD-10-CM | POA: Diagnosis not present

## 2020-06-08 DIAGNOSIS — R5383 Other fatigue: Secondary | ICD-10-CM

## 2020-06-08 DIAGNOSIS — I10 Essential (primary) hypertension: Secondary | ICD-10-CM

## 2020-06-08 NOTE — Telephone Encounter (Signed)
Scheduled for today with lauren

## 2020-06-08 NOTE — Assessment & Plan Note (Signed)
EKG obtained and showed NSR. Checking TSH, CBC. If labwork is normal, may consider CXR in the future. Follow-up in 1 week.

## 2020-06-08 NOTE — Assessment & Plan Note (Addendum)
New onset fatigue. Post covid-19 symptoms vs orthostatic hypotension vs anemia vs hypo/hyperthyroid. Orthostatic blood pressure positive when standing. Will discontinue hctz. Will check CMP, CBC, TSH, and covid-19 antibodies since he wasn't vaccinated. He has lost 17 pounds since 04/24/20. He said he had a limited appetite when he potentially had COVID in January. Call if symptoms worsen. Will follow-up in 1 week.

## 2020-06-08 NOTE — Assessment & Plan Note (Signed)
With fatigue, will check TSH today

## 2020-06-08 NOTE — Patient Instructions (Signed)
It was great to see you!  Stop hydrochlorothiazide (fluid pill). We are checking blood work and trying to figure out the cause of your fatigue and foggy-headedness.   Let's follow-up in 1 week, sooner if you have concerns.  Take care,  Vance Peper, NP

## 2020-06-08 NOTE — Telephone Encounter (Signed)
Pt called in wanting to just stop by and get someone to check his bp. Advised to pt that he would need an appt. Pt is scheduled with Dr Wynetta Emery for 2/14 Monday since he wants to see her only and declined appointment with Santiago Glad. Pt states that he sometimes gets light headed and foggy brained. Please advise if pt needs and earlier appt.

## 2020-06-08 NOTE — Telephone Encounter (Signed)
OK to book with Ander Purpura and I'll see him as well if OK with patient

## 2020-06-08 NOTE — Assessment & Plan Note (Signed)
Blood pressure is 105/71 today. Along with symptoms of fatigue and foggy headedness will discontinue his hydrochlorothiazide. Encouraged him to check his blood pressure at home and will follow-up in 1 week.

## 2020-06-08 NOTE — Progress Notes (Signed)
Established Patient Office Visit  Subjective:  Patient ID: Arthur Richardson, male    DOB: 01-Sep-1950  Age: 70 y.o. MRN: [id number]  CC:  Chief Complaint  Patient presents with  . Fatigue    Patient states he has been feeling very fatigue, and says hes foggy. Patient thinks maybe its his BP   . Breathing Problem    Patient states he feels short of breath when he walks a lot. Had covid in January.     HPI Arthur Richardson presents for fatigue. He does not check his blood pressure at home.   FATIGUE  Duration:  weeks, 1 week Severity: moderate  Onset: gradual Context when symptoms started:  unknown Symptoms improve with rest: yes Depressive symptoms: no Stress/anxiety: no Insomnia: no  Snoring: yes Observed apnea by bed partner: no Daytime hypersomnolence:yes Wakes feeling refreshed: yes History of sleep study: no Dysnea on exertion:  yes  Orthopnea/PND: no Chest pain: no Chronic cough: no Lower extremity edema: no Arthralgias:no Myalgias: no Weakness: yes, in right leg which is chronic for him. Usually needs to hold onto something to stand up Rash: no   Has also noticed a foggy feeling intermittently. Not dizzy, not quite a headache. Has not tried anything at home.   He was sick with a viral illness in January, but never tested for covid.   Past Medical History:  Diagnosis Date  . Hyperlipidemia   . Hypertension     Past Surgical History:  Procedure Laterality Date  . LITHOTRIPSY  09/1998 & 07/29/1999   Due to nephrolithiasis  . PROSTATE SURGERY     Biopsy= Negative  . TONSILLECTOMY      Family History  Problem Relation Age of Onset  . Stroke Father   . Cancer Sister        Pancreatic Cancer    Social History   Socioeconomic History  . Marital status: Married    Spouse name: Not on file  . Number of children: Not on file  . Years of education: Not on file  . Highest education level: Not on file  Occupational History  . Not on file   Tobacco Use  . Smoking status: Former Smoker    Types: Cigarettes    Quit date: 01/31/1980    Years since quitting: 40.3  . Smokeless tobacco: Former Systems developer    Types: Secondary school teacher  . Vaping Use: Never used  Substance and Sexual Activity  . Alcohol use: Not Currently    Alcohol/week: 0.0 standard drinks  . Drug use: No  . Sexual activity: Yes  Other Topics Concern  . Not on file  Social History Narrative  . Not on file   Social Determinants of Health   Financial Resource Strain: Low Risk   . Difficulty of Paying Living Expenses: Not hard at all  Food Insecurity: No Food Insecurity  . Worried About Charity fundraiser in the Last Year: Never true  . Ran Out of Food in the Last Year: Never true  Transportation Needs: No Transportation Needs  . Lack of Transportation (Medical): No  . Lack of Transportation (Non-Medical): No  Physical Activity: Inactive  . Days of Exercise per Week: 0 days  . Minutes of Exercise per Session: 0 min  Stress: No Stress Concern Present  . Feeling of Stress : Not at all  Social Connections: Not on file  Intimate Partner Violence: Not on file    Outpatient Medications Prior to Visit  Medication  Sig Dispense Refill  . amLODipine (NORVASC) 5 MG tablet Take 1 tablet (5 mg total) by mouth daily. 90 tablet 1  . aspirin EC 81 MG tablet Take 81 mg by mouth daily.    . hydrochlorothiazide (HYDRODIURIL) 25 MG tablet Take 1 tablet (25 mg total) by mouth daily. 90 tablet 1  . levothyroxine (SYNTHROID) 50 MCG tablet TAKE 1 TABLET BY MOUTH  DAILY BEFORE BREAKFAST 90 tablet 3  . losartan (COZAAR) 100 MG tablet Take 1 tablet (100 mg total) by mouth daily. 90 tablet 1  . nystatin-triamcinolone ointment (MYCOLOG) Apply 1 application topically 2 (two) times daily. 30 g 0  . omeprazole (PRILOSEC) 40 MG capsule Take 1 capsule (40 mg total) by mouth daily. 90 capsule 1  . tamsulosin (FLOMAX) 0.4 MG CAPS capsule Take 1 capsule (0.4 mg total) by mouth daily. 90  capsule 0  . predniSONE (DELTASONE) 10 MG tablet Take by mouth.    Marland Kitchen tiZANidine (ZANAFLEX) 4 MG tablet Take 4 mg by mouth 3 (three) times daily. (Patient not taking: Reported on 06/08/2020)     No facility-administered medications prior to visit.    Allergies  Allergen Reactions  . Penicillins     ROS Review of Systems  Constitutional: Positive for fatigue. Negative for chills and fever.  HENT: Negative.   Respiratory: Positive for shortness of breath (with exertion).   Cardiovascular: Negative.   Gastrointestinal: Negative.   Endocrine: Negative.   Genitourinary: Negative.   Skin: Negative.   Neurological: Positive for weakness (right leg, chronic). Negative for dizziness and syncope.       "foggy feeling intermittently"      Objective:    Physical Exam  BP 105/71   Pulse 80   Temp 97.8 F (36.6 C)   Wt 191 lb 12.8 oz (87 kg)   SpO2 98%   BMI 29.60 kg/m  Wt Readings from Last 3 Encounters:  06/08/20 191 lb 12.8 oz (87 kg)  04/24/20 208 lb 3.2 oz (94.4 kg)  04/07/20 208 lb (94.3 kg)   Orthostatic VS for the past 24 hrs:  BP- Lying BP- Sitting BP- Standing at 0 minutes  06/08/20 1530 124/77 126/83 100/69     Lab Results  Component Value Date   TSH 1.940 04/24/2020   Lab Results  Component Value Date   WBC 7.1 04/24/2020   HGB 17.2 04/24/2020   HCT 49.9 04/24/2020   MCV 94 04/24/2020   PLT 216 04/24/2020   Lab Results  Component Value Date   NA 136 04/24/2020   K 4.4 04/24/2020   CO2 24 04/24/2020   GLUCOSE 100 (H) 04/24/2020   BUN 16 04/24/2020   CREATININE 1.01 04/24/2020   BILITOT 0.6 04/24/2020   ALKPHOS 124 (H) 04/24/2020   AST 34 04/24/2020   ALT 42 04/24/2020   PROT 7.2 04/24/2020   ALBUMIN 4.6 04/24/2020   CALCIUM 10.0 04/24/2020   Lab Results  Component Value Date   CHOL 197 04/24/2020   Lab Results  Component Value Date   HDL 56 04/24/2020   Lab Results  Component Value Date   LDLCALC 127 (H) 04/24/2020   Lab Results   Component Value Date   TRIG 77 04/24/2020   Lab Results  Component Value Date   CHOLHDL 3.4 02/23/2018      Assessment & Plan:   Problem List Items Addressed This Visit      Cardiovascular and Mediastinum   Hypertension    Blood pressure is 105/71 today.  Along with symptoms of fatigue and foggy headedness will discontinue his hydrochlorothiazide. Encouraged him to check his blood pressure at home and will follow-up in 1 week.        Endocrine   Hypothyroidism    With fatigue, will check TSH today      Relevant Orders   TSH     Other   Fatigue    New onset fatigue. Post covid-19 symptoms vs orthostatic hypotension vs anemia vs hypo/hyperthyroid. Orthostatic blood pressure positive when standing. Will discontinue hctz. Will check CMP, CBC, TSH, and covid-19 antibodies since he wasn't vaccinated. He has lost 17 pounds since 04/24/20. He said he had a limited appetite when he potentially had COVID in January. Call if symptoms worsen. Will follow-up in 1 week.      Relevant Orders   Comp Met (CMET)   CBC with Differential   TSH   SARS-CoV-2 Semi-Quantitative Total Antibody, Spike   Dyspnea on exertion - Primary    EKG obtained and showed NSR. Checking TSH, CBC. If labwork is normal, may consider CXR in the future. Follow-up in 1 week.      Relevant Orders   EKG 12-Lead   CBC with Differential   SARS-CoV-2 Semi-Quantitative Total Antibody, Spike      No orders of the defined types were placed in this encounter.   Follow-up: Return in about 1 week (around 06/15/2020).    Charyl Dancer, NP

## 2020-06-09 LAB — CBC WITH DIFFERENTIAL/PLATELET
Basophils Absolute: 0.1 10*3/uL (ref 0.0–0.2)
Basos: 1 %
EOS (ABSOLUTE): 0.3 10*3/uL (ref 0.0–0.4)
Eos: 5 %
Hematocrit: 45.4 % (ref 37.5–51.0)
Hemoglobin: 16 g/dL (ref 13.0–17.7)
Immature Grans (Abs): 0 10*3/uL (ref 0.0–0.1)
Immature Granulocytes: 0 %
Lymphocytes Absolute: 2.1 10*3/uL (ref 0.7–3.1)
Lymphs: 33 %
MCH: 32.5 pg (ref 26.6–33.0)
MCHC: 35.2 g/dL (ref 31.5–35.7)
MCV: 92 fL (ref 79–97)
Monocytes Absolute: 0.8 10*3/uL (ref 0.1–0.9)
Monocytes: 12 %
Neutrophils Absolute: 3.2 10*3/uL (ref 1.4–7.0)
Neutrophils: 49 %
Platelets: 347 10*3/uL (ref 150–450)
RBC: 4.92 x10E6/uL (ref 4.14–5.80)
RDW: 11.9 % (ref 11.6–15.4)
WBC: 6.4 10*3/uL (ref 3.4–10.8)

## 2020-06-09 LAB — COMPREHENSIVE METABOLIC PANEL
ALT: 54 IU/L — ABNORMAL HIGH (ref 0–44)
AST: 40 IU/L (ref 0–40)
Albumin/Globulin Ratio: 1.3 (ref 1.2–2.2)
Albumin: 4.2 g/dL (ref 3.7–4.7)
Alkaline Phosphatase: 137 IU/L — ABNORMAL HIGH (ref 44–121)
BUN/Creatinine Ratio: 23 (ref 10–24)
BUN: 37 mg/dL — ABNORMAL HIGH (ref 8–27)
Bilirubin Total: 0.8 mg/dL (ref 0.0–1.2)
CO2: 24 mmol/L (ref 20–29)
Calcium: 9.7 mg/dL (ref 8.6–10.2)
Chloride: 102 mmol/L (ref 96–106)
Creatinine, Ser: 1.61 mg/dL — ABNORMAL HIGH (ref 0.76–1.27)
GFR calc Af Amer: 49 mL/min/{1.73_m2} — ABNORMAL LOW (ref >59)
GFR calc non Af Amer: 42 mL/min/{1.73_m2} — ABNORMAL LOW (ref >59)
Globulin, Total: 3.2 g/dL (ref 1.5–4.5)
Glucose: 104 mg/dL — ABNORMAL HIGH (ref 65–99)
Potassium: 4.1 mmol/L (ref 3.5–5.2)
Sodium: 144 mmol/L (ref 134–144)
Total Protein: 7.4 g/dL (ref 6.0–8.5)

## 2020-06-09 LAB — TSH: TSH: 1.4 u[IU]/mL (ref 0.450–4.500)

## 2020-06-09 LAB — SARS-COV-2 SEMI-QUANTITATIVE TOTAL ANTIBODY, SPIKE
SARS-CoV-2 Semi-Quant Total Ab: 77.8 U/mL (ref <0.8)
SARS-CoV-2 Spike Ab Interp: POSITIVE

## 2020-06-09 NOTE — Progress Notes (Signed)
Please let Arthur Richardson know we got his lab results back. He had positive antibodies for covid which means he most likely had covid last month. His kidney function tests are elevated. We told him to stop his hydrochlorothiazide (HCTZ) yesterday, so make sure he did stop this medication. Also encourage him to drink a lot of fluid (especially water). We want to recheck his kidney function on Monday (can be lab appointment only) and keep his scheduled appointment for later next week. His thyroid and blood count (hemoglobin) were normal.

## 2020-06-10 NOTE — Progress Notes (Signed)
I saw and evaluated the above patient on 06/08/20.  The case was discussed on rounds with Vance Peper, DNP. I personally reviewed the HPI, PH, FH, SH, ROS and medications. I repeated pertinent portions of the examination and reviewed the relevant imaging and laboratory data. I agree with the findings, assessment and plan as documented.

## 2020-06-12 ENCOUNTER — Ambulatory Visit: Payer: Medicare Other | Admitting: Family Medicine

## 2020-06-12 ENCOUNTER — Other Ambulatory Visit: Payer: Self-pay

## 2020-06-12 ENCOUNTER — Other Ambulatory Visit: Payer: Medicare Other

## 2020-06-12 DIAGNOSIS — R7989 Other specified abnormal findings of blood chemistry: Secondary | ICD-10-CM

## 2020-06-13 LAB — BASIC METABOLIC PANEL
BUN/Creatinine Ratio: 19 (ref 10–24)
BUN: 24 mg/dL (ref 8–27)
CO2: 25 mmol/L (ref 20–29)
Calcium: 10 mg/dL (ref 8.6–10.2)
Chloride: 102 mmol/L (ref 96–106)
Creatinine, Ser: 1.27 mg/dL (ref 0.76–1.27)
GFR calc Af Amer: 65 mL/min/{1.73_m2} (ref >59)
GFR calc non Af Amer: 56 mL/min/{1.73_m2} — ABNORMAL LOW (ref >59)
Glucose: 101 mg/dL — ABNORMAL HIGH (ref 65–99)
Potassium: 4 mmol/L (ref 3.5–5.2)
Sodium: 144 mmol/L (ref 134–144)

## 2020-06-15 ENCOUNTER — Other Ambulatory Visit: Payer: Self-pay

## 2020-06-15 ENCOUNTER — Encounter: Payer: Self-pay | Admitting: Family Medicine

## 2020-06-15 ENCOUNTER — Ambulatory Visit (INDEPENDENT_AMBULATORY_CARE_PROVIDER_SITE_OTHER): Payer: Medicare Other | Admitting: Family Medicine

## 2020-06-15 VITALS — BP 118/70 | HR 84 | Temp 97.4°F | Wt 193.6 lb

## 2020-06-15 DIAGNOSIS — I10 Essential (primary) hypertension: Secondary | ICD-10-CM

## 2020-06-15 DIAGNOSIS — N289 Disorder of kidney and ureter, unspecified: Secondary | ICD-10-CM

## 2020-06-15 NOTE — Patient Instructions (Signed)
STOP THE HYDROCHOLORTHIAZIDE, I'll see you in a week

## 2020-06-15 NOTE — Progress Notes (Signed)
BP 118/70   Pulse 84   Temp (!) 97.4 F (36.3 C)   Wt 193 lb 9.6 oz (87.8 kg)   SpO2 96%   BMI 29.87 kg/m    Subjective:    Patient ID: Arthur Richardson, male    DOB: [birth year], 70 y.o.   MRN: [id number]  HPI: Arthur Richardson is a 70 y.o. male  Chief Complaint  Patient presents with  . Hypertension    Patient was told to stop hydrochlorothiazide, accidentally stopped omeprazole instead of BP medication.    . Fatigue    Follow up    HYPERTENSION Hypertension status: stable  Satisfied with current treatment? yes Duration of hypertension: chronic BP monitoring frequency:  not checking BP medication side effects:  no Medication compliance: excellent compliance Aspirin: yes Recurrent headaches: no Visual changes: no Palpitations: no Dyspnea: no Chest pain: no Lower extremity edema: no Dizzy/lightheaded: no  Relevant past medical, surgical, family and social history reviewed and updated as indicated. Interim medical history since our last visit reviewed. Allergies and medications reviewed and updated.  Review of Systems  Constitutional: Negative.   Respiratory: Negative.   Cardiovascular: Negative.   Gastrointestinal: Negative.   Musculoskeletal: Negative.   Neurological: Negative.   Psychiatric/Behavioral: Negative.     Per HPI unless specifically indicated above     Objective:    BP 118/70   Pulse 84   Temp (!) 97.4 F (36.3 C)   Wt 193 lb 9.6 oz (87.8 kg)   SpO2 96%   BMI 29.87 kg/m   Wt Readings from Last 3 Encounters:  06/15/20 193 lb 9.6 oz (87.8 kg)  06/08/20 191 lb 12.8 oz (87 kg)  04/24/20 208 lb 3.2 oz (94.4 kg)    Physical Exam Vitals and nursing note reviewed.  Constitutional:      General: He is not in acute distress.    Appearance: Normal appearance. He is not ill-appearing, toxic-appearing or diaphoretic.  HENT:     Head: Normocephalic and atraumatic.     Right Ear: External ear normal.     Left Ear: External ear normal.      Nose: Nose normal.     Mouth/Throat:     Mouth: Mucous membranes are moist.     Pharynx: Oropharynx is clear.  Eyes:     General: No scleral icterus.       Right eye: No discharge.        Left eye: No discharge.     Extraocular Movements: Extraocular movements intact.     Conjunctiva/sclera: Conjunctivae normal.     Pupils: Pupils are equal, round, and reactive to light.  Cardiovascular:     Rate and Rhythm: Normal rate and regular rhythm.     Pulses: Normal pulses.     Heart sounds: Normal heart sounds. No murmur heard. No friction rub. No gallop.   Pulmonary:     Effort: Pulmonary effort is normal. No respiratory distress.     Breath sounds: Normal breath sounds. No stridor. No wheezing, rhonchi or rales.  Chest:     Chest wall: No tenderness.  Musculoskeletal:        General: Normal range of motion.     Cervical back: Normal range of motion and neck supple.  Skin:    General: Skin is warm and dry.     Capillary Refill: Capillary refill takes less than 2 seconds.     Coloration: Skin is not jaundiced or pale.     Findings: No  bruising, erythema, lesion or rash.  Neurological:     General: No focal deficit present.     Mental Status: He is alert and oriented to person, place, and time. Mental status is at baseline.  Psychiatric:        Mood and Affect: Mood normal.        Behavior: Behavior normal.        Thought Content: Thought content normal.        Judgment: Judgment normal.     Results for orders placed or performed in visit on 41/32/44  Basic metabolic panel  Result Value Ref Range   Glucose 102 (H) 65 - 99 mg/dL   BUN 20 8 - 27 mg/dL   Creatinine, Ser 1.13 0.76 - 1.27 mg/dL   GFR calc non Af Amer 65 >59 mL/min/1.73   GFR calc Af Amer 75 >59 mL/min/1.73   BUN/Creatinine Ratio 18 10 - 24   Sodium 144 134 - 144 mmol/L   Potassium 3.3 (L) 3.5 - 5.2 mmol/L   Chloride 103 96 - 106 mmol/L   CO2 22 20 - 29 mmol/L   Calcium 10.1 8.6 - 10.2 mg/dL       Assessment & Plan:   Problem List Items Addressed This Visit      Cardiovascular and Mediastinum   Hypertension - Primary    Stopped his omeprazole rather than his HCTZ. He has been feeling better. He will stop his HCTZ and recheck in 1-2 weeks. Call with any concerns. Continue to monitor.        Other Visit Diagnoses    Abnormal kidney function       Rechecking labs today. Await results. Treat as needed.    Relevant Orders   Basic metabolic panel (Completed)       Follow up plan: Return in about 1 week (around 06/22/2020).

## 2020-06-16 LAB — BASIC METABOLIC PANEL
BUN/Creatinine Ratio: 18 (ref 10–24)
BUN: 20 mg/dL (ref 8–27)
CO2: 22 mmol/L (ref 20–29)
Calcium: 10.1 mg/dL (ref 8.6–10.2)
Chloride: 103 mmol/L (ref 96–106)
Creatinine, Ser: 1.13 mg/dL (ref 0.76–1.27)
GFR calc Af Amer: 75 mL/min/{1.73_m2} (ref >59)
GFR calc non Af Amer: 65 mL/min/{1.73_m2} (ref >59)
Glucose: 102 mg/dL — ABNORMAL HIGH (ref 65–99)
Potassium: 3.3 mmol/L — ABNORMAL LOW (ref 3.5–5.2)
Sodium: 144 mmol/L (ref 134–144)

## 2020-06-18 NOTE — Assessment & Plan Note (Signed)
Stopped his omeprazole rather than his HCTZ. He has been feeling better. He will stop his HCTZ and recheck in 1-2 weeks. Call with any concerns. Continue to monitor.

## 2020-06-22 ENCOUNTER — Other Ambulatory Visit: Payer: Self-pay

## 2020-06-22 ENCOUNTER — Encounter: Payer: Self-pay | Admitting: Family Medicine

## 2020-06-22 ENCOUNTER — Ambulatory Visit (INDEPENDENT_AMBULATORY_CARE_PROVIDER_SITE_OTHER): Payer: Medicare Other | Admitting: Family Medicine

## 2020-06-22 VITALS — BP 119/77 | HR 67 | Temp 97.7°F | Wt 193.0 lb

## 2020-06-22 DIAGNOSIS — I10 Essential (primary) hypertension: Secondary | ICD-10-CM | POA: Diagnosis not present

## 2020-06-22 DIAGNOSIS — E876 Hypokalemia: Secondary | ICD-10-CM

## 2020-06-22 NOTE — Assessment & Plan Note (Signed)
Hypotension has resolved. Feeling better. Remain off the HCTZ. Recheck 3 months. Call with any concerns.

## 2020-06-22 NOTE — Progress Notes (Signed)
BP 119/77   Pulse 67   Temp 97.7 F (36.5 C)   Wt 193 lb (87.5 kg)   SpO2 95%   BMI 29.78 kg/m    Subjective:    Patient ID: Arthur Richardson, male    DOB: 03/23/1951, 70 y.o.   MRN: [id number]  HPI: Arthur Richardson is a 70 y.o. male  Chief Complaint  Patient presents with  . Hypertension   HYPERTENSION Hypertension status: better  Satisfied with current treatment? yes Duration of hypertension: chronic BP monitoring frequency:  a few times a week BP range: 120s/80s BP medication side effects:  no Medication compliance: excellent compliance Previous BP meds:amlodipine, losartan Aspirin: no Recurrent headaches: no Visual changes: no Palpitations: no Dyspnea: no Chest pain: no Lower extremity edema: no Dizzy/lightheaded: no   Relevant past medical, surgical, family and social history reviewed and updated as indicated. Interim medical history since our last visit reviewed. Allergies and medications reviewed and updated.  Review of Systems  Constitutional: Positive for fatigue. Negative for activity change, appetite change, chills, diaphoresis, fever and unexpected weight change.  Respiratory: Negative.   Cardiovascular: Negative.   Gastrointestinal: Negative.   Musculoskeletal: Negative.   Psychiatric/Behavioral: Negative.        Still foggy headed    Per HPI unless specifically indicated above     Objective:    BP 119/77   Pulse 67   Temp 97.7 F (36.5 C)   Wt 193 lb (87.5 kg)   SpO2 95%   BMI 29.78 kg/m   Wt Readings from Last 3 Encounters:  06/22/20 193 lb (87.5 kg)  06/15/20 193 lb 9.6 oz (87.8 kg)  06/08/20 191 lb 12.8 oz (87 kg)    Physical Exam Vitals and nursing note reviewed.  Constitutional:      General: He is not in acute distress.    Appearance: Normal appearance. He is not ill-appearing, toxic-appearing or diaphoretic.  HENT:     Head: Normocephalic and atraumatic.     Right Ear: External ear normal.     Left Ear:  External ear normal.     Nose: Nose normal.     Mouth/Throat:     Mouth: Mucous membranes are moist.     Pharynx: Oropharynx is clear.  Eyes:     General: No scleral icterus.       Right eye: No discharge.        Left eye: No discharge.     Extraocular Movements: Extraocular movements intact.     Conjunctiva/sclera: Conjunctivae normal.     Pupils: Pupils are equal, round, and reactive to light.  Cardiovascular:     Rate and Rhythm: Normal rate and regular rhythm.     Pulses: Normal pulses.     Heart sounds: Normal heart sounds. No murmur heard. No friction rub. No gallop.   Pulmonary:     Effort: Pulmonary effort is normal. No respiratory distress.     Breath sounds: Normal breath sounds. No stridor. No wheezing, rhonchi or rales.  Chest:     Chest wall: No tenderness.  Musculoskeletal:        General: Normal range of motion.     Cervical back: Normal range of motion and neck supple.  Skin:    General: Skin is warm and dry.     Capillary Refill: Capillary refill takes less than 2 seconds.     Coloration: Skin is not jaundiced or pale.     Findings: No bruising, erythema, lesion or rash.  Neurological:     General: No focal deficit present.     Mental Status: He is alert and oriented to person, place, and time. Mental status is at baseline.  Psychiatric:        Mood and Affect: Mood normal.        Behavior: Behavior normal.        Thought Content: Thought content normal.        Judgment: Judgment normal.     Results for orders placed or performed in visit on 45/03/88  Basic metabolic panel  Result Value Ref Range   Glucose 102 (H) 65 - 99 mg/dL   BUN 20 8 - 27 mg/dL   Creatinine, Ser 1.13 0.76 - 1.27 mg/dL   GFR calc non Af Amer 65 >59 mL/min/1.73   GFR calc Af Amer 75 >59 mL/min/1.73   BUN/Creatinine Ratio 18 10 - 24   Sodium 144 134 - 144 mmol/L   Potassium 3.3 (L) 3.5 - 5.2 mmol/L   Chloride 103 96 - 106 mmol/L   CO2 22 20 - 29 mmol/L   Calcium 10.1 8.6 - 10.2  mg/dL      Assessment & Plan:   Problem List Items Addressed This Visit      Cardiovascular and Mediastinum   Hypertension - Primary    Hypotension has resolved. Feeling better. Remain off the HCTZ. Recheck 3 months. Call with any concerns.        Other Visit Diagnoses    Hypokalemia       Rechecking labs today. Await results. Treat as needed.    Relevant Orders   Basic metabolic panel       Follow up plan: Return in about 4 months (around 10/20/2020).

## 2020-06-22 NOTE — Patient Instructions (Addendum)
Blood pressure ranges: GOOD: MORE THAN 100/70 BAD: MORE THAN 150/90  Stay off the HCTZ- throw it out!  Everything else looks good. If he's not feeling 100% in the next couple of weeks- call me.

## 2020-06-23 LAB — BASIC METABOLIC PANEL
BUN/Creatinine Ratio: 10 (ref 10–24)
BUN: 11 mg/dL (ref 8–27)
CO2: 22 mmol/L (ref 20–29)
Calcium: 9.4 mg/dL (ref 8.6–10.2)
Chloride: 108 mmol/L — ABNORMAL HIGH (ref 96–106)
Creatinine, Ser: 1.09 mg/dL (ref 0.76–1.27)
GFR calc Af Amer: 79 mL/min/{1.73_m2} (ref >59)
GFR calc non Af Amer: 68 mL/min/{1.73_m2} (ref >59)
Glucose: 82 mg/dL (ref 65–99)
Potassium: 3.9 mmol/L (ref 3.5–5.2)
Sodium: 145 mmol/L — ABNORMAL HIGH (ref 134–144)

## 2020-07-02 ENCOUNTER — Other Ambulatory Visit: Payer: Self-pay | Admitting: Family Medicine

## 2020-07-02 DIAGNOSIS — N138 Other obstructive and reflux uropathy: Secondary | ICD-10-CM

## 2020-07-02 DIAGNOSIS — N401 Enlarged prostate with lower urinary tract symptoms: Secondary | ICD-10-CM

## 2020-07-02 NOTE — Telephone Encounter (Signed)
Requested Prescriptions  Pending Prescriptions Disp Refills  . tamsulosin (FLOMAX) 0.4 MG CAPS capsule [Pharmacy Med Name: Tamsulosin HCl 0.4 MG Oral Capsule] 90 capsule 3    Sig: TAKE 1 CAPSULE BY MOUTH  DAILY     Urology: Alpha-Adrenergic Blocker Passed - 07/02/2020  6:19 AM      Passed - Last BP in normal range    BP Readings from Last 1 Encounters:  06/22/20 119/77         Passed - Valid encounter within last 12 months    Recent Outpatient Visits          1 week ago Primary hypertension   [place], Megan P, DO   2 weeks ago Primary hypertension   [place], Megan P, DO   3 weeks ago Dyspnea on exertion   [place], Lauren A, NP   2 months ago Routine general medical examination at a health care facility   [facility], [place] P, DO   4 months ago Candida infection   [facility] [place], Lyle, DO      Future Appointments            In 3 months Johnson, Barb Merino, DO [employer] [facility], [place]   In 9 months  [employer] [facility], [place]

## 2020-07-10 ENCOUNTER — Telehealth: Payer: Self-pay | Admitting: Family Medicine

## 2020-07-10 DIAGNOSIS — E039 Hypothyroidism, unspecified: Secondary | ICD-10-CM

## 2020-07-10 MED ORDER — LEVOTHYROXINE SODIUM 50 MCG PO TABS: 50.0000 ug | ORAL_TABLET | Freq: Every day | ORAL | 0 refills | Status: DC

## 2020-07-10 NOTE — Telephone Encounter (Signed)
Routing to provider  

## 2020-07-10 NOTE — Telephone Encounter (Signed)
Requesting 10 pills levothyroxine (SYNTHROID) 50 MCG tablet until mail order is delivered. Patient would like a follow up call when done. Patient contacted the pharmacy and was advised to call PCP office   [facility] [place], [address] Phone:  [phone]  Fax:  [phone]

## 2020-07-10 NOTE — Telephone Encounter (Signed)
10 tabs sent to [facility].

## 2020-07-10 NOTE — Telephone Encounter (Signed)
Patient aware of rx at pharmacy 

## 2020-07-28 ENCOUNTER — Other Ambulatory Visit: Payer: Self-pay | Admitting: Family Medicine

## 2020-07-28 DIAGNOSIS — M5416 Radiculopathy, lumbar region: Secondary | ICD-10-CM

## 2020-07-28 DIAGNOSIS — M79602 Pain in left arm: Secondary | ICD-10-CM | POA: Diagnosis not present

## 2020-07-28 DIAGNOSIS — M5136 Other intervertebral disc degeneration, lumbar region: Secondary | ICD-10-CM

## 2020-07-28 DIAGNOSIS — M19012 Primary osteoarthritis, left shoulder: Secondary | ICD-10-CM | POA: Diagnosis not present

## 2020-07-28 DIAGNOSIS — M7542 Impingement syndrome of left shoulder: Secondary | ICD-10-CM | POA: Diagnosis not present

## 2020-07-28 DIAGNOSIS — M50323 Other cervical disc degeneration at C6-C7 level: Secondary | ICD-10-CM | POA: Diagnosis not present

## 2020-08-15 ENCOUNTER — Other Ambulatory Visit: Payer: Self-pay

## 2020-08-15 ENCOUNTER — Ambulatory Visit
Admission: RE | Admit: 2020-08-15 | Discharge: 2020-08-15 | Disposition: A | Discharge status: Home / Self Care | Payer: Medicare Other | Source: Ambulatory Visit | Attending: Family Medicine | Admitting: Family Medicine

## 2020-08-15 DIAGNOSIS — M5136 Other intervertebral disc degeneration, lumbar region: Secondary | ICD-10-CM | POA: Insufficient documentation

## 2020-08-15 DIAGNOSIS — M7542 Impingement syndrome of left shoulder: Secondary | ICD-10-CM

## 2020-08-15 DIAGNOSIS — M5416 Radiculopathy, lumbar region: Secondary | ICD-10-CM | POA: Insufficient documentation

## 2020-08-15 DIAGNOSIS — M545 Low back pain, unspecified: Secondary | ICD-10-CM | POA: Diagnosis not present

## 2020-08-15 DIAGNOSIS — M25412 Effusion, left shoulder: Secondary | ICD-10-CM | POA: Diagnosis not present

## 2020-08-15 DIAGNOSIS — M19012 Primary osteoarthritis, left shoulder: Secondary | ICD-10-CM | POA: Diagnosis not present

## 2020-08-16 DIAGNOSIS — R0602 Shortness of breath: Secondary | ICD-10-CM | POA: Diagnosis not present

## 2020-08-16 DIAGNOSIS — R29898 Other symptoms and signs involving the musculoskeletal system: Secondary | ICD-10-CM | POA: Diagnosis not present

## 2020-08-16 DIAGNOSIS — M5136 Other intervertebral disc degeneration, lumbar region: Secondary | ICD-10-CM | POA: Diagnosis not present

## 2020-08-16 DIAGNOSIS — M25512 Pain in left shoulder: Secondary | ICD-10-CM | POA: Diagnosis not present

## 2020-08-16 DIAGNOSIS — M48061 Spinal stenosis, lumbar region without neurogenic claudication: Secondary | ICD-10-CM | POA: Diagnosis not present

## 2020-08-16 DIAGNOSIS — Z8739 Personal history of other diseases of the musculoskeletal system and connective tissue: Secondary | ICD-10-CM | POA: Diagnosis not present

## 2020-08-16 DIAGNOSIS — G8929 Other chronic pain: Secondary | ICD-10-CM | POA: Diagnosis not present

## 2020-08-16 DIAGNOSIS — M778 Other enthesopathies, not elsewhere classified: Secondary | ICD-10-CM | POA: Diagnosis not present

## 2020-08-16 DIAGNOSIS — M5416 Radiculopathy, lumbar region: Secondary | ICD-10-CM | POA: Diagnosis not present

## 2020-08-16 DIAGNOSIS — M7542 Impingement syndrome of left shoulder: Secondary | ICD-10-CM | POA: Diagnosis not present

## 2020-08-24 ENCOUNTER — Other Ambulatory Visit: Payer: Self-pay | Admitting: Neurology

## 2020-08-24 DIAGNOSIS — R29898 Other symptoms and signs involving the musculoskeletal system: Secondary | ICD-10-CM

## 2020-08-29 DIAGNOSIS — M48061 Spinal stenosis, lumbar region without neurogenic claudication: Secondary | ICD-10-CM | POA: Diagnosis not present

## 2020-08-29 DIAGNOSIS — M5416 Radiculopathy, lumbar region: Secondary | ICD-10-CM | POA: Diagnosis not present

## 2020-08-29 DIAGNOSIS — M5136 Other intervertebral disc degeneration, lumbar region: Secondary | ICD-10-CM | POA: Diagnosis not present

## 2020-09-03 ENCOUNTER — Ambulatory Visit: Payer: Medicare Other

## 2020-09-05 ENCOUNTER — Ambulatory Visit
Admission: RE | Admit: 2020-09-05 | Discharge: 2020-09-05 | Disposition: A | Discharge status: Home / Self Care | Payer: Medicare Other | Source: Ambulatory Visit | Attending: Neurology | Admitting: Neurology

## 2020-09-05 ENCOUNTER — Other Ambulatory Visit: Payer: Self-pay

## 2020-09-05 DIAGNOSIS — G319 Degenerative disease of nervous system, unspecified: Secondary | ICD-10-CM | POA: Diagnosis not present

## 2020-09-05 DIAGNOSIS — R29898 Other symptoms and signs involving the musculoskeletal system: Secondary | ICD-10-CM | POA: Insufficient documentation

## 2020-09-05 DIAGNOSIS — R531 Weakness: Secondary | ICD-10-CM | POA: Diagnosis not present

## 2020-09-05 DIAGNOSIS — J019 Acute sinusitis, unspecified: Secondary | ICD-10-CM | POA: Diagnosis not present

## 2020-09-10 ENCOUNTER — Other Ambulatory Visit: Payer: Self-pay | Admitting: Family Medicine

## 2020-09-10 DIAGNOSIS — I10 Essential (primary) hypertension: Secondary | ICD-10-CM

## 2020-09-10 DIAGNOSIS — K2270 Barrett's esophagus without dysplasia: Secondary | ICD-10-CM

## 2020-09-19 DIAGNOSIS — M5416 Radiculopathy, lumbar region: Secondary | ICD-10-CM | POA: Diagnosis not present

## 2020-09-19 DIAGNOSIS — M48061 Spinal stenosis, lumbar region without neurogenic claudication: Secondary | ICD-10-CM | POA: Diagnosis not present

## 2020-09-19 DIAGNOSIS — M5136 Other intervertebral disc degeneration, lumbar region: Secondary | ICD-10-CM | POA: Diagnosis not present

## 2020-09-21 DIAGNOSIS — G8929 Other chronic pain: Secondary | ICD-10-CM | POA: Diagnosis not present

## 2020-09-21 DIAGNOSIS — R29898 Other symptoms and signs involving the musculoskeletal system: Secondary | ICD-10-CM | POA: Diagnosis not present

## 2020-09-21 DIAGNOSIS — R0602 Shortness of breath: Secondary | ICD-10-CM | POA: Diagnosis not present

## 2020-09-21 DIAGNOSIS — M545 Low back pain, unspecified: Secondary | ICD-10-CM | POA: Diagnosis not present

## 2020-10-11 DIAGNOSIS — R29898 Other symptoms and signs involving the musculoskeletal system: Secondary | ICD-10-CM | POA: Diagnosis not present

## 2020-10-15 ENCOUNTER — Other Ambulatory Visit: Payer: Self-pay | Admitting: Family Medicine

## 2020-10-15 DIAGNOSIS — K2270 Barrett's esophagus without dysplasia: Secondary | ICD-10-CM

## 2020-10-15 DIAGNOSIS — I10 Essential (primary) hypertension: Secondary | ICD-10-CM

## 2020-10-15 NOTE — Telephone Encounter (Signed)
Requested Prescriptions  Pending Prescriptions Disp Refills  . amLODipine (NORVASC) 5 MG tablet [Pharmacy Med Name: amLODIPine Besylate 5 MG Oral Tablet] 90 tablet 1    Sig: TAKE 1 TABLET BY MOUTH  DAILY     Cardiovascular:  Calcium Channel Blockers Passed - 10/15/2020  9:04 AM      Passed - Last BP in normal range    BP Readings from Last 1 Encounters:  06/22/20 119/77         Passed - Valid encounter within last 6 months    Recent Outpatient Visits          3 months ago Primary hypertension   [facility] [place], Megan P, DO   4 months ago Primary hypertension   [place], Megan P, DO   4 months ago Dyspnea on exertion   [place], Lauren A, NP   5 months ago Routine general medical examination at a health care facility   [facility], [place] P, DO   8 months ago Candida infection   [facility] [place], [place], DO      Future Appointments            In 1 week Johnson, Megan P, DO [place], [facility]   In 5 months  [facility], [facility]           . losartan (COZAAR) 100 MG tablet [Pharmacy Med Name: Losartan Potassium 100 MG Oral Tablet] 90 tablet 1    Sig: TAKE 1 TABLET BY MOUTH  DAILY     Cardiovascular:  Angiotensin Receptor Blockers Passed - 10/15/2020  9:04 AM      Passed - Cr in normal range and within 180 days    Creatinine, Ser  Date Value Ref Range Status  06/22/2020 1.09 0.76 - 1.27 mg/dL Final    Comment:                   **Effective June 26, 2020 [facility] will begin**                  reporting the 2021 CKD-EPI creatinine equation that                  estimates kidney function without a race variable.          Passed - K in normal range and within 180 days    Potassium  Date Value Ref Range Status  06/22/2020 3.9 3.5 - 5.2 mmol/L Final         Passed - Patient is not pregnant      Passed - Last BP in normal range    BP Readings from  Last 1 Encounters:  06/22/20 119/77         Passed - Valid encounter within last 6 months    Recent Outpatient Visits          3 months ago Primary hypertension   [place], Megan P, DO   4 months ago Primary hypertension   [place], Megan P, DO   4 months ago Dyspnea on exertion   [place], Lauren A, NP   5 months ago Routine general medical examination at a health care facility   [facility] [place], [place] P, DO   8 months ago Candida infection   [place], Barb Merino, DO      Future Appointments  In 1 week Wynetta Emery, Barb Merino, DO [place], [facility]   In 5 months  [facility], [facility]           . omeprazole (PRILOSEC) 40 MG capsule [Pharmacy Med Name: Omeprazole 40 MG Oral Capsule Delayed Release] 90 capsule 1    Sig: TAKE 1 CAPSULE BY MOUTH  DAILY     Gastroenterology: Proton Pump Inhibitors Passed - 10/15/2020  9:04 AM      Passed - Valid encounter within last 12 months    Recent Outpatient Visits          3 months ago Primary hypertension   [place], Megan P, DO   4 months ago Primary hypertension   [facility], Megan P, DO   4 months ago Dyspnea on exertion   [place], Lauren A, NP   5 months ago Routine general medical examination at a health care facility   [facility], [place] P, DO   8 months ago Candida infection   [facility] [place], [place], DO      Future Appointments            In 1 week Johnson, Megan P, DO [place], [facility]   In 5 months  [facility], [facility]           . hydrochlorothiazide (HYDRODIURIL) 25 MG tablet [Pharmacy Med Name: hydroCHLOROthiazide 25 MG Oral Tablet] 90 tablet     Sig: TAKE 1 TABLET BY MOUTH  DAILY     Cardiovascular: Diuretics - Thiazide Failed - 10/15/2020  9:04 AM       Failed - Na in normal range and within 360 days    Sodium  Date Value Ref Range Status  06/22/2020 145 (H) 134 - 144 mmol/L Final         Passed - Ca in normal range and within 360 days    Calcium  Date Value Ref Range Status  06/22/2020 9.4 8.6 - 10.2 mg/dL Final         Passed - Cr in normal range and within 360 days    Creatinine, Ser  Date Value Ref Range Status  06/22/2020 1.09 0.76 - 1.27 mg/dL Final    Comment:                   **Effective June 26, 2020 [facility] will begin**                  reporting the 2021 CKD-EPI creatinine equation that                  estimates kidney function without a race variable.          Passed - K in normal range and within 360 days    Potassium  Date Value Ref Range Status  06/22/2020 3.9 3.5 - 5.2 mmol/L Final         Passed - Last BP in normal range    BP Readings from Last 1 Encounters:  06/22/20 119/77         Passed - Valid encounter within last 6 months    Recent Outpatient Visits          3 months ago Primary hypertension   [place], Megan P, DO   4 months ago Primary hypertension   [place], Megan P, DO   4 months ago Dyspnea on exertion  [place], Lauren A, NP   5 months ago Routine general medical examination at a health care facility   [place], [place] P, DO   8 months ago Candida infection   [place], [place], DO      Future Appointments            In 1 week Wynetta Emery, Barb Merino, DO [place], [place]   In 5 months  [employer], [place]

## 2020-10-16 ENCOUNTER — Other Ambulatory Visit: Payer: Self-pay | Admitting: Family Medicine

## 2020-10-16 DIAGNOSIS — E039 Hypothyroidism, unspecified: Secondary | ICD-10-CM

## 2020-10-16 MED ORDER — LEVOTHYROXINE SODIUM 50 MCG PO TABS: 50.0000 ug | ORAL_TABLET | Freq: Every day | ORAL | 0 refills | Status: DC

## 2020-10-16 NOTE — Telephone Encounter (Signed)
Copied from [place] [id number]. Topic: Quick Communication - Rx Refill/Question >> Oct 16, 2020 10:11 AM Yvette Rack wrote: Pt request Rx for 10 pills until his mail order is delivered  Medication: levothyroxine (SYNTHROID) 50 MCG tablet  Has the patient contacted their pharmacy? No. (Agent: If no, request that the patient contact the pharmacy for the refill.) (Agent: If yes, when and what did the pharmacy advise?)  Preferred Pharmacy (with phone number or street name): [facility] [place], [place] - [address] Phone: [phone]  Fax: [phone]  Agent: Please be advised that RX refills may take up to 3 business days. We ask that you follow-up with your pharmacy.

## 2020-10-23 ENCOUNTER — Ambulatory Visit (INDEPENDENT_AMBULATORY_CARE_PROVIDER_SITE_OTHER): Payer: Medicare Other | Admitting: Family Medicine

## 2020-10-23 ENCOUNTER — Encounter: Payer: Self-pay | Admitting: Family Medicine

## 2020-10-23 ENCOUNTER — Other Ambulatory Visit: Payer: Self-pay

## 2020-10-23 VITALS — BP 137/76 | HR 65 | Temp 98.0°F | Ht 68.0 in | Wt 213.6 lb

## 2020-10-23 DIAGNOSIS — N138 Other obstructive and reflux uropathy: Secondary | ICD-10-CM | POA: Diagnosis not present

## 2020-10-23 DIAGNOSIS — Z8616 Personal history of COVID-19: Secondary | ICD-10-CM

## 2020-10-23 DIAGNOSIS — E7849 Other hyperlipidemia: Secondary | ICD-10-CM | POA: Diagnosis not present

## 2020-10-23 DIAGNOSIS — I10 Essential (primary) hypertension: Secondary | ICD-10-CM | POA: Diagnosis not present

## 2020-10-23 DIAGNOSIS — E039 Hypothyroidism, unspecified: Secondary | ICD-10-CM

## 2020-10-23 DIAGNOSIS — R350 Frequency of micturition: Secondary | ICD-10-CM | POA: Diagnosis not present

## 2020-10-23 DIAGNOSIS — N401 Enlarged prostate with lower urinary tract symptoms: Secondary | ICD-10-CM

## 2020-10-23 LAB — URINALYSIS, ROUTINE W REFLEX MICROSCOPIC
Bilirubin, UA: NEGATIVE
Glucose, UA: NEGATIVE
Ketones, UA: NEGATIVE
Leukocytes,UA: NEGATIVE
Nitrite, UA: NEGATIVE
Protein,UA: NEGATIVE
RBC, UA: NEGATIVE
Specific Gravity, UA: 1.02 (ref 1.005–1.030)
Urobilinogen, Ur: 0.2 mg/dL (ref 0.2–1.0)
pH, UA: 7 (ref 5.0–7.5)

## 2020-10-23 MED ORDER — DUTASTERIDE 0.5 MG PO CAPS: 0.5000 mg | ORAL_CAPSULE | Freq: Every day | ORAL | 1 refills | Status: DC

## 2020-10-23 NOTE — Assessment & Plan Note (Signed)
Under good control on current regimen. Continue current regimen. Continue to monitor. Call with any concerns. Refills given. If kidney function doing OK will restart HCTZ. Await results. Treat as needed.

## 2020-10-23 NOTE — Addendum Note (Signed)
Addended by: Valerie Roys on: 10/23/2020 10:51 AM   Modules accepted: Orders

## 2020-10-23 NOTE — Assessment & Plan Note (Signed)
Rechecking labs today. Await results. Treat as needed.  °

## 2020-10-23 NOTE — Assessment & Plan Note (Signed)
Not under good control. Will add avodart and recheck 1 month. Call with any concerns. Continue flomax

## 2020-10-23 NOTE — Progress Notes (Signed)
BP 137/76 (BP Location: Left Arm, Cuff Size: Normal)   Pulse 65   Temp 98 F (36.7 C) (Oral)   Ht 5\' 8"  (1.727 m)   Wt 213 lb 9.6 oz (96.9 kg)   SpO2 97%   BMI 32.48 kg/m    Subjective:    Patient ID: Arthur Richardson, male    DOB: 1950/06/17, 70 y.o.   MRN: [id number]  HPI: Arthur Richardson is a 70 y.o. male  Chief Complaint  Patient presents with   Hypertension   Hyperlipidemia   Hypothyroidism    HYPERTENSION / HYPERLIPIDEMIA Satisfied with current treatment? yes Duration of hypertension: chronic BP monitoring frequency: not checking BP range:  BP medication side effects: no Past BP meds: losartan, amlodipine Duration of hyperlipidemia: chronic Cholesterol medication side effects: not on anything Cholesterol supplements: none Past cholesterol medications: none Medication compliance: excellent compliance Aspirin: no Recent stressors: no Recurrent headaches: no Visual changes: no Palpitations: no Dyspnea: no Chest pain: no Lower extremity edema: yes Dizzy/lightheaded: no  HYPOTHYROIDISM Thyroid control status:stable Satisfied with current treatment? yes Medication side effects: no Medication compliance: excellent compliance Recent dose adjustment:no Fatigue: yes Cold intolerance: no Heat intolerance: no Weight gain: no Weight loss: no Constipation: no Diarrhea/loose stools: no Palpitations: no Lower extremity edema: yes Anxiety/depressed mood: no  BPH BPH status: uncontrolled Satisfied with current treatment?: no Medication side effects: no Medication compliance: excellent compliance Duration: chronic Nocturia: 3-4x per night Urinary frequency:yes Incomplete voiding: yes Urgency: yes Weak urinary stream: yes Straining to start stream: yes Dysuria: no Onset: gradual Severity: moderate  Relevant past medical, surgical, family and social history reviewed and updated as indicated. Interim medical history since our last visit  reviewed. Allergies and medications reviewed and updated.  Review of Systems  Constitutional: Negative.   Respiratory: Negative.    Cardiovascular:  Positive for leg swelling. Negative for chest pain and palpitations.  Gastrointestinal: Negative.   Musculoskeletal: Negative.   Psychiatric/Behavioral: Negative.     Per HPI unless specifically indicated above     Objective:    BP 137/76 (BP Location: Left Arm, Cuff Size: Normal)   Pulse 65   Temp 98 F (36.7 C) (Oral)   Ht 5\' 8"  (1.727 m)   Wt 213 lb 9.6 oz (96.9 kg)   SpO2 97%   BMI 32.48 kg/m   Wt Readings from Last 3 Encounters:  10/23/20 213 lb 9.6 oz (96.9 kg)  06/22/20 193 lb (87.5 kg)  06/15/20 193 lb 9.6 oz (87.8 kg)    Physical Exam Vitals and nursing note reviewed.  Constitutional:      General: He is not in acute distress.    Appearance: Normal appearance. He is not ill-appearing, toxic-appearing or diaphoretic.  HENT:     Head: Normocephalic and atraumatic.     Right Ear: External ear normal.     Left Ear: External ear normal.     Nose: Nose normal.     Mouth/Throat:     Mouth: Mucous membranes are moist.     Pharynx: Oropharynx is clear.  Eyes:     General: No scleral icterus.       Right eye: No discharge.        Left eye: No discharge.     Extraocular Movements: Extraocular movements intact.     Conjunctiva/sclera: Conjunctivae normal.     Pupils: Pupils are equal, round, and reactive to light.  Cardiovascular:     Rate and Rhythm: Normal rate and regular rhythm.  Pulses: Normal pulses.     Heart sounds: Normal heart sounds. No murmur heard.   No friction rub. No gallop.  Pulmonary:     Effort: Pulmonary effort is normal. No respiratory distress.     Breath sounds: Normal breath sounds. No stridor. No wheezing, rhonchi or rales.  Chest:     Chest wall: No tenderness.  Musculoskeletal:        General: Normal range of motion.     Cervical back: Normal range of motion and neck supple.   Skin:    General: Skin is warm and dry.     Capillary Refill: Capillary refill takes less than 2 seconds.     Coloration: Skin is not jaundiced or pale.     Findings: No bruising, erythema, lesion or rash.  Neurological:     General: No focal deficit present.     Mental Status: He is alert and oriented to person, place, and time. Mental status is at baseline.  Psychiatric:        Mood and Affect: Mood normal.        Behavior: Behavior normal.        Thought Content: Thought content normal.        Judgment: Judgment normal.    Results for orders placed or performed in visit on 74/94/49  Basic metabolic panel  Result Value Ref Range   Glucose 82 65 - 99 mg/dL   BUN 11 8 - 27 mg/dL   Creatinine, Ser 1.09 0.76 - 1.27 mg/dL   GFR calc non Af Amer 68 >59 mL/min/1.73   GFR calc Af Amer 79 >59 mL/min/1.73   BUN/Creatinine Ratio 10 10 - 24   Sodium 145 (H) 134 - 144 mmol/L   Potassium 3.9 3.5 - 5.2 mmol/L   Chloride 108 (H) 96 - 106 mmol/L   CO2 22 20 - 29 mmol/L   Calcium 9.4 8.6 - 10.2 mg/dL      Assessment & Plan:   Problem List Items Addressed This Visit       Cardiovascular and Mediastinum   Hypertension - Primary    Under good control on current regimen. Continue current regimen. Continue to monitor. Call with any concerns. Refills given. If kidney function doing OK will restart HCTZ. Await results. Treat as needed.         Relevant Orders   Comprehensive metabolic panel     Endocrine   Hypothyroidism    Rechecking labs today. Await results. Treat as needed.        Relevant Orders   Comprehensive metabolic panel   TSH     Genitourinary   BPH with obstruction/lower urinary tract symptoms    Not under good control. Will add avodart and recheck 1 month. Call with any concerns. Continue flomax       Relevant Medications   dutasteride (AVODART) 0.5 MG capsule     Other   Hyperlipidemia    Rechecking labs today. Await results. Treat as needed.         Relevant Orders   Comprehensive metabolic panel   Lipid Panel w/o Chol/HDL Ratio   Other Visit Diagnoses     Essential hypertension       Urinary frequency       Will check UA. Treat BPH. Continue to monitor.    Relevant Orders   UA/M w/rflx Culture, Routine   History of COVID-19       Would like CXR- ordered today.   Relevant Orders  DG Chest 2 View        Follow up plan: Return in about 4 weeks (around 11/20/2020).

## 2020-10-24 ENCOUNTER — Telehealth: Payer: Self-pay

## 2020-10-24 ENCOUNTER — Other Ambulatory Visit: Payer: Self-pay | Admitting: Family Medicine

## 2020-10-24 DIAGNOSIS — E875 Hyperkalemia: Secondary | ICD-10-CM

## 2020-10-24 LAB — LIPID PANEL W/O CHOL/HDL RATIO
Cholesterol, Total: 204 mg/dL — ABNORMAL HIGH (ref 100–199)
HDL: 64 mg/dL (ref >39)
LDL Chol Calc (NIH): 117 mg/dL — ABNORMAL HIGH (ref 0–99)
Triglycerides: 129 mg/dL (ref 0–149)
VLDL Cholesterol Cal: 23 mg/dL (ref 5–40)

## 2020-10-24 LAB — COMPREHENSIVE METABOLIC PANEL
ALT: 42 IU/L (ref 0–44)
AST: 37 IU/L (ref 0–40)
Albumin/Globulin Ratio: 1.7 (ref 1.2–2.2)
Albumin: 4.7 g/dL (ref 3.7–4.7)
Alkaline Phosphatase: 125 IU/L — ABNORMAL HIGH (ref 44–121)
BUN/Creatinine Ratio: 12 (ref 10–24)
BUN: 12 mg/dL (ref 8–27)
Bilirubin Total: 0.5 mg/dL (ref 0.0–1.2)
CO2: 24 mmol/L (ref 20–29)
Calcium: 10.1 mg/dL (ref 8.6–10.2)
Chloride: 100 mmol/L (ref 96–106)
Creatinine, Ser: 1.04 mg/dL (ref 0.76–1.27)
Globulin, Total: 2.7 g/dL (ref 1.5–4.5)
Glucose: 98 mg/dL (ref 65–99)
Potassium: 5.6 mmol/L — ABNORMAL HIGH (ref 3.5–5.2)
Sodium: 140 mmol/L (ref 134–144)
Total Protein: 7.4 g/dL (ref 6.0–8.5)
eGFR: 77 mL/min/{1.73_m2} (ref >59)

## 2020-10-24 LAB — TSH: TSH: 1.78 u[IU]/mL (ref 0.450–4.500)

## 2020-10-24 NOTE — Telephone Encounter (Signed)
Coupon for [employer]. I want to hold off on the fluid pill until I see his blood work on Friday

## 2020-10-24 NOTE — Telephone Encounter (Signed)
Patient notified. States he did not get a coupon. Will try to find one for him to have when he comes for labs on Friday.

## 2020-10-24 NOTE — Telephone Encounter (Signed)
Patient is requesting an alternative for Dutasteride and would like to try the the fluid pills that you discuss with him at visit yesterday.

## 2020-10-24 NOTE — Telephone Encounter (Signed)
Copied from [place] [id number]. Topic: General - Other >> Oct 24, 2020  8:28 AM Leward Quan A wrote: Reason for CRM: Patient called in to inform Dr Wynetta Emery that the Rx sent to the pharmacy was too expensive asking for something that is more cost effective would like this to be done ASAP. Also state that he is willing to try the fluid pills that was discussed. Please call Ph# [phone]

## 2020-10-24 NOTE — Telephone Encounter (Signed)
He can get it for $9 without insurance with a coupon- did they run his insurance? It should not be an expensive medicine

## 2020-10-24 NOTE — Telephone Encounter (Signed)
What medication

## 2020-10-27 ENCOUNTER — Ambulatory Visit: Payer: Self-pay | Admitting: *Deleted

## 2020-10-27 ENCOUNTER — Other Ambulatory Visit: Payer: Self-pay

## 2020-10-27 ENCOUNTER — Other Ambulatory Visit: Payer: Medicare Other

## 2020-10-27 DIAGNOSIS — E875 Hyperkalemia: Secondary | ICD-10-CM | POA: Diagnosis not present

## 2020-10-27 NOTE — Telephone Encounter (Signed)
Should be taking BOTH of those medicines.

## 2020-10-27 NOTE — Telephone Encounter (Signed)
Patient notified

## 2020-10-27 NOTE — Telephone Encounter (Signed)
Pt called stating that he is getting confusing info on which medications he needs to be taking. He is requesting to have a nurse give him a call to explain it to him again. Please advise .    Not under good control. Will add avodart and recheck 1 month. Call with any concerns. Continue flomax  tamsulosin (FLOMAX) 0.4 MG CAPS capsule dutasteride (AVODART) 0.5 MG capsule.  Reason for Disposition  Caller has medicine question only, adult not sick, AND triager answers question  Answer Assessment - Initial Assessment Questions 1. NAME of MEDICATION: "What medicine are you calling about?"     Tamsulosin and dutasteride  2. QUESTION: "What is your question?" (e.g., double dose of medicine, side effect)     Patient states when he was at lab today he was told to hold tamsulosin and that is not what he understood from PCP- wants to verify. 3. PRESCRIBING HCP: "Who prescribed it?" Reason: if prescribed by specialist, call should be referred to that group.     PCP Advised per chart- this was add to and continue- will send note for verification and call back.  Protocols used: Medication Question Call-A-AH

## 2020-10-28 LAB — BASIC METABOLIC PANEL
BUN/Creatinine Ratio: 11 (ref 10–24)
BUN: 10 mg/dL (ref 8–27)
CO2: 23 mmol/L (ref 20–29)
Calcium: 10 mg/dL (ref 8.6–10.2)
Chloride: 96 mmol/L (ref 96–106)
Creatinine, Ser: 0.89 mg/dL (ref 0.76–1.27)
Glucose: 96 mg/dL (ref 65–99)
Potassium: 4.8 mmol/L (ref 3.5–5.2)
Sodium: 138 mmol/L (ref 134–144)
eGFR: 92 mL/min/{1.73_m2} (ref >59)

## 2020-10-31 ENCOUNTER — Other Ambulatory Visit: Payer: Self-pay | Admitting: Family Medicine

## 2020-10-31 MED ORDER — HYDROCHLOROTHIAZIDE 25 MG PO TABS: 25.0000 mg | ORAL_TABLET | Freq: Every day | ORAL | 3 refills | Status: DC

## 2020-11-13 ENCOUNTER — Telehealth: Payer: Self-pay | Admitting: Family Medicine

## 2020-11-13 NOTE — Telephone Encounter (Signed)
Can we please call to ask him how he feels he is emptying his bladder and how often he thinks he is peeing at night?

## 2020-11-13 NOTE — Telephone Encounter (Signed)
-----   Message from Valerie Roys, DO sent at 10/23/2020  9:36 AM EDT ----- Call to check in with BPH

## 2020-11-13 NOTE — Telephone Encounter (Signed)
Left message for patient to return our call to the office at his earliest convenience to follow up regarding Dr.Johnson's previous message.

## 2020-11-14 NOTE — Telephone Encounter (Signed)
Its getting some better, feels like it is emptying now, still getting up 2-3 a night to urinate

## 2020-11-21 ENCOUNTER — Encounter: Payer: Self-pay | Admitting: Family Medicine

## 2020-11-21 ENCOUNTER — Other Ambulatory Visit: Payer: Self-pay

## 2020-11-21 ENCOUNTER — Ambulatory Visit (INDEPENDENT_AMBULATORY_CARE_PROVIDER_SITE_OTHER): Payer: Medicare Other | Admitting: Family Medicine

## 2020-11-21 VITALS — BP 114/69 | HR 72 | Temp 97.9°F | Ht 68.0 in | Wt 211.2 lb

## 2020-11-21 DIAGNOSIS — N138 Other obstructive and reflux uropathy: Secondary | ICD-10-CM | POA: Diagnosis not present

## 2020-11-21 DIAGNOSIS — N401 Enlarged prostate with lower urinary tract symptoms: Secondary | ICD-10-CM

## 2020-11-21 DIAGNOSIS — I10 Essential (primary) hypertension: Secondary | ICD-10-CM

## 2020-11-21 NOTE — Assessment & Plan Note (Signed)
Under good control on current regimen. Continue current regimen. Continue to monitor. Call with any concerns. Refills given. Labs checked today.  

## 2020-11-21 NOTE — Progress Notes (Signed)
BP 114/69   Pulse 72   Temp 97.9 F (36.6 C) (Oral)   Ht 5' 8"  (1.727 m)   Wt 211 lb 3.2 oz (95.8 kg)   SpO2 98%   BMI 32.11 kg/m    Subjective:    Patient ID: Arthur Richardson, male    DOB: October 06, 1950, 70 y.o.   MRN: [id number]  HPI: Arthur Richardson is a 70 y.o. male  Chief Complaint  Patient presents with   Benign Prostatic Hypertrophy    4 week f/up   HYPERTENSION Hypertension status: controlled  Satisfied with current treatment? yes Duration of hypertension: chronic BP monitoring frequency:  not checking BP medication side effects:  no Medication compliance: excellent compliance Previous BP meds:hctz, losartan, amlodipine Aspirin: no Recurrent headaches: no Visual changes: no Palpitations: no Dyspnea: no Chest pain: no Lower extremity edema: yes Dizzy/lightheaded: no  BPH BPH status: better Satisfied with current treatment?: yes Medication side effects: no Medication compliance: excellent compliance Duration: chronic Nocturia: 1/night Urinary frequency:no Incomplete voiding: yes Urgency: no Weak urinary stream: no Straining to start stream: no Dysuria: no Onset: gradual Severity: mild   Relevant past medical, surgical, family and social history reviewed and updated as indicated. Interim medical history since our last visit reviewed. Allergies and medications reviewed and updated.  Review of Systems  Constitutional: Negative.   Respiratory: Negative.    Cardiovascular: Negative.   Gastrointestinal: Negative.   Musculoskeletal: Negative.   Neurological: Negative.   Psychiatric/Behavioral: Negative.     Per HPI unless specifically indicated above     Objective:    BP 114/69   Pulse 72   Temp 97.9 F (36.6 C) (Oral)   Ht 5' 8"  (1.727 m)   Wt 211 lb 3.2 oz (95.8 kg)   SpO2 98%   BMI 32.11 kg/m   Wt Readings from Last 3 Encounters:  11/21/20 211 lb 3.2 oz (95.8 kg)  10/23/20 213 lb 9.6 oz (96.9 kg)  06/22/20 193 lb (87.5 kg)     Physical Exam Vitals and nursing note reviewed.  Constitutional:      General: He is not in acute distress.    Appearance: Normal appearance. He is not ill-appearing, toxic-appearing or diaphoretic.  HENT:     Head: Normocephalic and atraumatic.     Right Ear: External ear normal.     Left Ear: External ear normal.     Nose: Nose normal.     Mouth/Throat:     Mouth: Mucous membranes are moist.     Pharynx: Oropharynx is clear.  Eyes:     General: No scleral icterus.       Right eye: No discharge.        Left eye: No discharge.     Extraocular Movements: Extraocular movements intact.     Conjunctiva/sclera: Conjunctivae normal.     Pupils: Pupils are equal, round, and reactive to light.  Cardiovascular:     Rate and Rhythm: Normal rate and regular rhythm.     Pulses: Normal pulses.     Heart sounds: Normal heart sounds. No murmur heard.   No friction rub. No gallop.  Pulmonary:     Effort: Pulmonary effort is normal. No respiratory distress.     Breath sounds: Normal breath sounds. No stridor. No wheezing, rhonchi or rales.  Chest:     Chest wall: No tenderness.  Musculoskeletal:        General: Normal range of motion.     Cervical back: Normal range of  motion and neck supple.  Skin:    General: Skin is warm and dry.     Capillary Refill: Capillary refill takes less than 2 seconds.     Coloration: Skin is not jaundiced or pale.     Findings: No bruising, erythema, lesion or rash.  Neurological:     General: No focal deficit present.     Mental Status: He is alert and oriented to person, place, and time. Mental status is at baseline.  Psychiatric:        Mood and Affect: Mood normal.        Behavior: Behavior normal.        Thought Content: Thought content normal.        Judgment: Judgment normal.    Results for orders placed or performed in visit on 88/64/84  Basic metabolic panel  Result Value Ref Range   Glucose 96 65 - 99 mg/dL   BUN 10 8 - 27 mg/dL    Creatinine, Ser 0.89 0.76 - 1.27 mg/dL   eGFR 92 >59 mL/min/1.73   BUN/Creatinine Ratio 11 10 - 24   Sodium 138 134 - 144 mmol/L   Potassium 4.8 3.5 - 5.2 mmol/L   Chloride 96 96 - 106 mmol/L   CO2 23 20 - 29 mmol/L   Calcium 10.0 8.6 - 10.2 mg/dL      Assessment & Plan:   Problem List Items Addressed This Visit       Cardiovascular and Mediastinum   Hypertension - Primary    Under good control on current regimen. Continue current regimen. Continue to monitor. Call with any concerns. Refills given. Labs checked today.        Relevant Orders   Basic metabolic panel     Genitourinary   BPH with obstruction/lower urinary tract symptoms    Better, but still not under good control. Will refer to urology. Call with any concerns.          Follow up plan: Return in about 5 months (around 04/25/2021), or physical.

## 2020-11-21 NOTE — Assessment & Plan Note (Signed)
Better, but still not under good control. Will refer to urology. Call with any concerns.

## 2020-11-22 ENCOUNTER — Encounter: Payer: Self-pay | Admitting: Family Medicine

## 2020-11-22 LAB — BASIC METABOLIC PANEL
BUN/Creatinine Ratio: 12 (ref 10–24)
BUN: 12 mg/dL (ref 8–27)
CO2: 24 mmol/L (ref 20–29)
Calcium: 10.2 mg/dL (ref 8.6–10.2)
Chloride: 96 mmol/L (ref 96–106)
Creatinine, Ser: 1.04 mg/dL (ref 0.76–1.27)
Glucose: 103 mg/dL — ABNORMAL HIGH (ref 65–99)
Potassium: 4.2 mmol/L (ref 3.5–5.2)
Sodium: 137 mmol/L (ref 134–144)
eGFR: 77 mL/min/{1.73_m2} (ref >59)

## 2021-01-01 ENCOUNTER — Other Ambulatory Visit: Payer: Self-pay | Admitting: Family Medicine

## 2021-01-02 NOTE — Telephone Encounter (Signed)
last RF #30 3 RF 10/31/20

## 2021-01-06 ENCOUNTER — Other Ambulatory Visit: Payer: Self-pay | Admitting: Family Medicine

## 2021-01-12 ENCOUNTER — Other Ambulatory Visit: Payer: Self-pay | Admitting: Family Medicine

## 2021-01-12 DIAGNOSIS — I10 Essential (primary) hypertension: Secondary | ICD-10-CM

## 2021-01-12 NOTE — Telephone Encounter (Signed)
Call to [facility]- Patient has just filled Rx and this is early request- apologies and they will close request.

## 2021-01-22 ENCOUNTER — Other Ambulatory Visit: Payer: Self-pay

## 2021-01-22 ENCOUNTER — Ambulatory Visit: Payer: Medicare Other

## 2021-01-22 ENCOUNTER — Ambulatory Visit (INDEPENDENT_AMBULATORY_CARE_PROVIDER_SITE_OTHER): Payer: Medicare Other

## 2021-01-22 DIAGNOSIS — Z23 Encounter for immunization: Secondary | ICD-10-CM | POA: Diagnosis not present

## 2021-02-26 DIAGNOSIS — M7542 Impingement syndrome of left shoulder: Secondary | ICD-10-CM | POA: Diagnosis not present

## 2021-02-26 DIAGNOSIS — M5416 Radiculopathy, lumbar region: Secondary | ICD-10-CM | POA: Diagnosis not present

## 2021-02-26 DIAGNOSIS — M5136 Other intervertebral disc degeneration, lumbar region: Secondary | ICD-10-CM | POA: Diagnosis not present

## 2021-02-26 DIAGNOSIS — M48061 Spinal stenosis, lumbar region without neurogenic claudication: Secondary | ICD-10-CM | POA: Diagnosis not present

## 2021-02-26 DIAGNOSIS — M47816 Spondylosis without myelopathy or radiculopathy, lumbar region: Secondary | ICD-10-CM | POA: Diagnosis not present

## 2021-03-02 ENCOUNTER — Encounter: Payer: Self-pay | Admitting: Cardiology

## 2021-03-02 ENCOUNTER — Other Ambulatory Visit: Payer: Self-pay

## 2021-03-02 ENCOUNTER — Ambulatory Visit: Payer: Medicare Other | Admitting: Cardiology

## 2021-03-02 VITALS — BP 134/70 | HR 62 | Ht 68.0 in | Wt 218.0 lb

## 2021-03-02 DIAGNOSIS — I10 Essential (primary) hypertension: Secondary | ICD-10-CM | POA: Diagnosis not present

## 2021-03-02 DIAGNOSIS — R600 Localized edema: Secondary | ICD-10-CM | POA: Diagnosis not present

## 2021-03-02 DIAGNOSIS — R0600 Dyspnea, unspecified: Secondary | ICD-10-CM

## 2021-03-02 DIAGNOSIS — E7800 Pure hypercholesterolemia, unspecified: Secondary | ICD-10-CM

## 2021-03-02 MED ORDER — FUROSEMIDE 40 MG PO TABS: 40.0000 mg | ORAL_TABLET | Freq: Every day | ORAL | 1 refills | Status: DC

## 2021-03-02 MED ORDER — ATORVASTATIN CALCIUM 40 MG PO TABS: 40.0000 mg | ORAL_TABLET | Freq: Every day | ORAL | 1 refills | Status: DC

## 2021-03-02 NOTE — Progress Notes (Signed)
Cardiology Office Note:    Date:  03/02/2021   ID:  Arthur Richardson, DOB 07/18/1950, MRN [id number]  PCP:  Valerie Roys, DO   [facility] Providers Cardiologist:  None     Referring MD: Valerie Roys, DO   Chief Complaint  Patient presents with   New Patient (Initial Visit)    Self referral -- Patient c.o swelling in ankles and SOB. Meds reviewed verbally with patient.     History of Present Illness:    Arthur Richardson is a 69 y.o. male with a hx of hypertension, hyperlipidemia, former smoker x5 years who presents due to edema and shortness of breath.  Patient was infected with COVID-17 May 2020.  He states having left arm sensation issues, left arm and left leg weakness since then.  Followed up with neurology, brain MRI was normal, currently undergoing nerve testing.  Complaining of shortness of breath with minimal exertion over the past year since he was diagnosed with COVID.  Has noticed lower extremity edema over the past 3 months.  Started on HCTZ but edema does not seem to be improving.  Diagnosed with BPH, scheduled to see urology.  He denies chest pain, smoked previously for only 5 years then stopped.  Endorses eating lots of salty foods.   Past Medical History:  Diagnosis Date   Hyperlipidemia    Hypertension     Past Surgical History:  Procedure Laterality Date   LITHOTRIPSY  09/1998 & 07/29/1999   Due to nephrolithiasis   PROSTATE SURGERY     Biopsy= Negative   TONSILLECTOMY      Current Medications: Current Meds  Medication Sig   amLODipine (NORVASC) 5 MG tablet TAKE 1 TABLET BY MOUTH  DAILY   aspirin EC 81 MG tablet Take 81 mg by mouth daily.   atorvastatin (LIPITOR) 40 MG tablet Take 1 tablet (40 mg total) by mouth daily.   furosemide (LASIX) 40 MG tablet Take 1 tablet (40 mg total) by mouth daily.   levothyroxine (SYNTHROID) 50 MCG tablet Take 1 tablet (50 mcg total) by mouth daily before breakfast.   losartan (COZAAR) 100 MG  tablet TAKE 1 TABLET BY MOUTH  DAILY   omeprazole (PRILOSEC) 40 MG capsule TAKE 1 CAPSULE BY MOUTH  DAILY   tamsulosin (FLOMAX) 0.4 MG CAPS capsule TAKE 1 CAPSULE BY MOUTH  DAILY   [DISCONTINUED] hydrochlorothiazide (HYDRODIURIL) 25 MG tablet TAKE 1 TABLET BY MOUTH  DAILY     Allergies:   Penicillins   Social History   Socioeconomic History   Marital status: Married    Spouse name: Not on file   Number of children: Not on file   Years of education: Not on file   Highest education level: Not on file  Occupational History   Not on file  Tobacco Use   Smoking status: Former    Types: Cigarettes    Quit date: 01/31/1980    Years since quitting: 41.1   Smokeless tobacco: Current    Types: Chew  Vaping Use   Vaping Use: Never used  Substance and Sexual Activity   Alcohol use: Yes    Comment: 3-4 beers daily   Drug use: No   Sexual activity: Yes  Other Topics Concern   Not on file  Social History Narrative   Not on file   Social Determinants of Health   Financial Resource Strain: Low Risk    Difficulty of Paying Living Expenses: Not hard at all  Food Insecurity:  No Food Insecurity   Worried About Charity fundraiser in the Last Year: Never true   Ran Out of Food in the Last Year: Never true  Transportation Needs: No Transportation Needs   Lack of Transportation (Medical): No   Lack of Transportation (Non-Medical): No  Physical Activity: Inactive   Days of Exercise per Week: 0 days   Minutes of Exercise per Session: 0 min  Stress: No Stress Concern Present   Feeling of Stress : Not at all  Social Connections: Not on file     Family History: The patient's family history includes Cancer in his sister; Stroke in his father.  ROS:   Please see the history of present illness.     All other systems reviewed and are negative.  EKGs/Labs/Other Studies Reviewed:    The following studies were reviewed today:   EKG:  EKG is  ordered today.  The ekg ordered today  demonstrates normal sinus rhythm, normal ECG.  Recent Labs: 06/08/2020: Hemoglobin 16.0; Platelets 347 10/23/2020: ALT 42; TSH 1.780 11/21/2020: BUN 12; Creatinine, Ser 1.04; Potassium 4.2; Sodium 137  Recent Lipid Panel    Component Value Date/Time   CHOL 204 (H) 10/23/2020 0950   TRIG 129 10/23/2020 0950   HDL 64 10/23/2020 0950   CHOLHDL 3.4 02/23/2018 1306   LDLCALC 117 (H) 10/23/2020 0950     Risk Assessment/Calculations:          Physical Exam:    VS:  BP 134/70 (BP Location: Left Arm, Patient Position: Sitting, Cuff Size: Large)   Pulse 62   Ht 5\' 8"  (1.727 m)   Wt 218 lb (98.9 kg)   SpO2 98%   BMI 33.15 kg/m     Wt Readings from Last 3 Encounters:  03/02/21 218 lb (98.9 kg)  11/21/20 211 lb 3.2 oz (95.8 kg)  10/23/20 213 lb 9.6 oz (96.9 kg)     GEN:  Well nourished, well developed in no acute distress HEENT: Normal NECK: No JVD; No carotid bruits LYMPHATICS: No lymphadenopathy CARDIAC: RRR, no murmurs, rubs, gallops RESPIRATORY:  Clear to auscultation without rales, wheezing or rhonchi  ABDOMEN: Soft, non-tender, non-distended MUSCULOSKELETAL:  2+ edema; No deformity  SKIN: Warm and dry NEUROLOGIC:  Alert and oriented x 3 PSYCHIATRIC:  Normal affect   ASSESSMENT:    1. Dyspnea, unspecified type   2. Primary hypertension   3. Pure hypercholesterolemia   4. Leg edema    PLAN:    In order of problems listed above:  Dyspnea on exertion, leg edema.  Get echocardiogram to evaluate systolic and diastolic function.  Start Lasix 40 mg daily.  Check BMP in 1 week.  Consider noninvasive testing versus left heart cath pending echo results if symptoms of shortness of breath persist. Hypertension, continue amlodipine 5 mg daily, stop HCTZ, start Lasix as above.  Low-salt diet advised. Hyperlipidemia, 10-year ASCVD risk 23.4%.  Start Lipitor 40 mg daily. leg edema, echo and Lasix as above.  Follow-up after echocardiogram       Medication Adjustments/Labs  and Tests Ordered: Current medicines are reviewed at length with the patient today.  Concerns regarding medicines are outlined above.  Orders Placed This Encounter  Procedures   Basic metabolic panel   EKG [id number]   ECHOCARDIOGRAM COMPLETE    Meds ordered this encounter  Medications   furosemide (LASIX) 40 MG tablet    Sig: Take 1 tablet (40 mg total) by mouth daily.    Dispense:  90 tablet  Refill:  1   atorvastatin (LIPITOR) 40 MG tablet    Sig: Take 1 tablet (40 mg total) by mouth daily.    Dispense:  90 tablet    Refill:  1     Patient Instructions  Medication Instructions:   Your physician has recommended you make the following change in your medication:    STOP taking your Hydrochlorothiazide.  2.    START taking Lasix 40 MG once a day.  3.    START taking Lipitor 40 MG once a day.   *If you need a refill on your cardiac medications before your next appointment, please call your pharmacy*   Lab Work:  Your physician recommends that you return for lab work (BMP) in: 1 week    Please return to our office on_____________________at______________am/pm    Testing/Procedures:  Your physician has requested that you have an echocardiogram. Echocardiography is a painless test that uses sound waves to create images of your heart. It provides your doctor with information about the size and shape of your heart and how well your heart's chambers and valves are working. This procedure takes approximately one hour. There are no restrictions for this procedure.    Follow-Up: At [facility], you and your health needs are our priority.  As part of our continuing mission to provide you with exceptional heart care, we have created designated Provider Care Teams.  These Care Teams include your primary Cardiologist (physician) and Advanced Practice Providers (APPs -  Physician Assistants and Nurse Practitioners) who all work together to provide you with the care you need, when  you need it.  We recommend signing up for the patient portal called "MyChart".  Sign up information is provided on this After Visit Summary.  MyChart is used to connect with patients for Virtual Visits (Telemedicine).  Patients are able to view lab/test results, encounter notes, upcoming appointments, etc.  Non-urgent messages can be sent to your provider as well.   To learn more about what you can do with MyChart, go to [email or web address].    Your next appointment:   Follow up after Echo   The format for your next appointment:   In Person  Provider:   You may see Dr. Garen Lah or one of the following Advanced Practice Providers on your designated Care Team:   Murray Hodgkins, NP Christell Faith, PA-C Cadence Kathlen Mody, [place]    Other Instructions    Signed, Kate Sable, MD  03/02/2021 12:17 PM    [place]

## 2021-03-02 NOTE — Patient Instructions (Signed)
Medication Instructions:   Your physician has recommended you make the following change in your medication:    STOP taking your Hydrochlorothiazide.  2.    START taking Lasix 40 MG once a day.  3.    START taking Lipitor 40 MG once a day.   *If you need a refill on your cardiac medications before your next appointment, please call your pharmacy*   Lab Work:  Your physician recommends that you return for lab work (BMP) in: 1 week    Please return to our office on_____________________at______________am/pm    Testing/Procedures:  Your physician has requested that you have an echocardiogram. Echocardiography is a painless test that uses sound waves to create images of your heart. It provides your doctor with information about the size and shape of your heart and how well your heart's chambers and valves are working. This procedure takes approximately one hour. There are no restrictions for this procedure.    Follow-Up: At [facility], you and your health needs are our priority.  As part of our continuing mission to provide you with exceptional heart care, we have created designated Provider Care Teams.  These Care Teams include your primary Cardiologist (physician) and Advanced Practice Providers (APPs -  Physician Assistants and Nurse Practitioners) who all work together to provide you with the care you need, when you need it.  We recommend signing up for the patient portal called "MyChart".  Sign up information is provided on this After Visit Summary.  MyChart is used to connect with patients for Virtual Visits (Telemedicine).  Patients are able to view lab/test results, encounter notes, upcoming appointments, etc.  Non-urgent messages can be sent to your provider as well.   To learn more about what you can do with MyChart, go to [email or web address].    Your next appointment:   Follow up after Echo   The format for your next appointment:   In Person  Provider:   You may  see Dr. Garen Lah or one of the following Advanced Practice Providers on your designated Care Team:   Murray Hodgkins, NP Christell Faith, PA-C Cadence Kathlen Mody, [place]    Other Instructions

## 2021-03-04 ENCOUNTER — Other Ambulatory Visit: Payer: Self-pay | Admitting: Family Medicine

## 2021-03-04 DIAGNOSIS — I10 Essential (primary) hypertension: Secondary | ICD-10-CM

## 2021-03-04 NOTE — Telephone Encounter (Signed)
last RF 10/15/20 #90 1 RF should have enough to last until December

## 2021-03-06 DIAGNOSIS — M7672 Peroneal tendinitis, left leg: Secondary | ICD-10-CM | POA: Diagnosis not present

## 2021-03-06 DIAGNOSIS — M47816 Spondylosis without myelopathy or radiculopathy, lumbar region: Secondary | ICD-10-CM | POA: Diagnosis not present

## 2021-03-06 DIAGNOSIS — M79672 Pain in left foot: Secondary | ICD-10-CM | POA: Diagnosis not present

## 2021-03-06 DIAGNOSIS — S93402A Sprain of unspecified ligament of left ankle, initial encounter: Secondary | ICD-10-CM | POA: Diagnosis not present

## 2021-03-11 ENCOUNTER — Other Ambulatory Visit: Payer: Self-pay | Admitting: Family Medicine

## 2021-03-11 NOTE — Telephone Encounter (Signed)
Requested medication (s) are due for refill today: Yes  Requested medication (s) are on the active medication list: no  Last refill:  01/08/21  Future visit scheduled: yes  Notes to clinic:  dc'd on 03/02/21. Please advise     Requested Prescriptions  Pending Prescriptions Disp Refills   hydrochlorothiazide (HYDRODIURIL) 25 MG tablet [Pharmacy Med Name: hydroCHLOROthiazide 25 MG Oral Tablet] 90 tablet 3    Sig: TAKE 1 TABLET BY MOUTH  DAILY     Cardiovascular: Diuretics - Thiazide Passed - 03/11/2021  6:00 AM      Passed - Ca in normal range and within 360 days    Calcium  Date Value Ref Range Status  11/21/2020 10.2 8.6 - 10.2 mg/dL Final          Passed - Cr in normal range and within 360 days    Creatinine, Ser  Date Value Ref Range Status  11/21/2020 1.04 0.76 - 1.27 mg/dL Final          Passed - K in normal range and within 360 days    Potassium  Date Value Ref Range Status  11/21/2020 4.2 3.5 - 5.2 mmol/L Final          Passed - Na in normal range and within 360 days    Sodium  Date Value Ref Range Status  11/21/2020 137 134 - 144 mmol/L Final          Passed - Last BP in normal range    BP Readings from Last 1 Encounters:  03/02/21 134/70          Passed - Valid encounter within last 6 months    Recent Outpatient Visits           3 months ago Primary hypertension   [facility] [place], Megan P, DO   4 months ago Primary hypertension   [facility] [place], [place], DO   8 months ago Primary hypertension   [facility] [place], [place], DO   8 months ago Primary hypertension   [facility] [place], Megan P, DO   9 months ago Dyspnea on exertion   [place], Scheryl Darter, NP       Future Appointments             In 4 weeks  [place], [place]   In 1 month [place], Aaron Edelman, MD [facility]   In 2 months [place], Barb Merino, DO [employer], [facility]

## 2021-03-27 DIAGNOSIS — M47816 Spondylosis without myelopathy or radiculopathy, lumbar region: Secondary | ICD-10-CM | POA: Diagnosis not present

## 2021-04-02 ENCOUNTER — Other Ambulatory Visit (INDEPENDENT_AMBULATORY_CARE_PROVIDER_SITE_OTHER): Payer: Medicare Other

## 2021-04-02 ENCOUNTER — Other Ambulatory Visit: Payer: Self-pay

## 2021-04-02 DIAGNOSIS — R0600 Dyspnea, unspecified: Secondary | ICD-10-CM | POA: Diagnosis not present

## 2021-04-02 DIAGNOSIS — I10 Essential (primary) hypertension: Secondary | ICD-10-CM | POA: Diagnosis not present

## 2021-04-03 LAB — BASIC METABOLIC PANEL
BUN/Creatinine Ratio: 16 (ref 10–24)
BUN: 16 mg/dL (ref 8–27)
CO2: 25 mmol/L (ref 20–29)
Calcium: 9.8 mg/dL (ref 8.6–10.2)
Chloride: 101 mmol/L (ref 96–106)
Creatinine, Ser: 1 mg/dL (ref 0.76–1.27)
Glucose: 92 mg/dL (ref 70–99)
Potassium: 3.9 mmol/L (ref 3.5–5.2)
Sodium: 141 mmol/L (ref 134–144)
eGFR: 80 mL/min/{1.73_m2} (ref >59)

## 2021-04-06 ENCOUNTER — Ambulatory Visit (INDEPENDENT_AMBULATORY_CARE_PROVIDER_SITE_OTHER): Payer: Medicare Other

## 2021-04-06 ENCOUNTER — Other Ambulatory Visit: Payer: Self-pay

## 2021-04-06 ENCOUNTER — Encounter: Payer: Self-pay | Admitting: Emergency Medicine

## 2021-04-06 DIAGNOSIS — R600 Localized edema: Secondary | ICD-10-CM | POA: Diagnosis not present

## 2021-04-06 DIAGNOSIS — R0600 Dyspnea, unspecified: Secondary | ICD-10-CM | POA: Diagnosis not present

## 2021-04-06 LAB — ECHOCARDIOGRAM COMPLETE
AR max vel: 2.95 cm2
AV Area VTI: 3.23 cm2
AV Area mean vel: 2.92 cm2
AV Mean grad: 3 mmHg
AV Peak grad: 5.2 mmHg
Ao pk vel: 1.14 m/s
Area-P 1/2: 3.93 cm2
Calc EF: 54.2 %
S' Lateral: 3 cm
Single Plane A2C EF: 55.6 %
Single Plane A4C EF: 51.9 %

## 2021-04-09 ENCOUNTER — Other Ambulatory Visit: Payer: Self-pay | Admitting: Family Medicine

## 2021-04-09 ENCOUNTER — Ambulatory Visit (INDEPENDENT_AMBULATORY_CARE_PROVIDER_SITE_OTHER): Payer: Medicare Other | Admitting: *Deleted

## 2021-04-09 DIAGNOSIS — Z0000 Encounter for general adult medical examination without abnormal findings: Secondary | ICD-10-CM

## 2021-04-09 DIAGNOSIS — I10 Essential (primary) hypertension: Secondary | ICD-10-CM

## 2021-04-09 NOTE — Progress Notes (Signed)
Subjective:   Arthur Richardson is a 70 y.o. male who presents for Medicare Annual/Subsequent preventive examination.  I connected with  Arthur Richardson on 04/09/21 by a  telephone  enabled telemedicine application and verified that I am speaking with the correct person using two identifiers.   I discussed the limitations of evaluation and management by telemedicine. The patient expressed understanding and agreed to proceed.  Patient location: home  Provider location: Tele-Health  not in clinic     Review of Systems     Cardiac Risk Factors include: advanced age (>63men, >27 women);hypertension;obesity (BMI >30kg/m2)     Objective:    Today's Vitals   04/09/21 1121  PainSc: 8    There is no height or weight on file to calculate BMI.  Advanced Directives 04/09/2021 04/07/2020  Does Patient Have a Medical Advance Directive? Yes No  Type of Advance Directive Living will -    Current Medications (verified) Outpatient Encounter Medications as of 04/09/2021  Medication Sig   amLODipine (NORVASC) 5 MG tablet TAKE 1 TABLET BY MOUTH  DAILY   aspirin EC 81 MG tablet Take 81 mg by mouth daily.   atorvastatin (LIPITOR) 40 MG tablet Take 1 tablet (40 mg total) by mouth daily.   dutasteride (AVODART) 0.5 MG capsule Take 1 capsule (0.5 mg total) by mouth daily.   furosemide (LASIX) 40 MG tablet Take 1 tablet (40 mg total) by mouth daily.   levothyroxine (SYNTHROID) 50 MCG tablet Take 1 tablet (50 mcg total) by mouth daily before breakfast.   losartan (COZAAR) 100 MG tablet TAKE 1 TABLET BY MOUTH  DAILY   omeprazole (PRILOSEC) 40 MG capsule TAKE 1 CAPSULE BY MOUTH  DAILY   tamsulosin (FLOMAX) 0.4 MG CAPS capsule TAKE 1 CAPSULE BY MOUTH  DAILY   gabapentin (NEURONTIN) 100 MG capsule Take 200 mg by mouth at bedtime. (Patient not taking: Reported on 03/02/2021)   No facility-administered encounter medications on file as of 04/09/2021.    Allergies (verified) Penicillins    History: Past Medical History:  Diagnosis Date   Hyperlipidemia    Hypertension    Past Surgical History:  Procedure Laterality Date   LITHOTRIPSY  09/1998 & 07/29/1999   Due to nephrolithiasis   PROSTATE SURGERY     Biopsy= Negative   TONSILLECTOMY     Family History  Problem Relation Age of Onset   Stroke Father    Cancer Sister        Pancreatic Cancer   Social History   Socioeconomic History   Marital status: Married    Spouse name: Not on file   Number of children: Not on file   Years of education: Not on file   Highest education level: Not on file  Occupational History   Not on file  Tobacco Use   Smoking status: Former    Types: Cigarettes    Quit date: 01/31/1980    Years since quitting: 41.2   Smokeless tobacco: Current    Types: Chew  Vaping Use   Vaping Use: Never used  Substance and Sexual Activity   Alcohol use: Yes    Comment: 3-4 beers daily   Drug use: No   Sexual activity: Yes  Other Topics Concern   Not on file  Social History Narrative   Not on file   Social Determinants of Health   Financial Resource Strain: Low Risk    Difficulty of Paying Living Expenses: Not hard at all  Food Insecurity: No  Food Insecurity   Worried About Charity fundraiser in the Last Year: Never true   Ran Out of Food in the Last Year: Never true  Transportation Needs: No Transportation Needs   Lack of Transportation (Medical): No   Lack of Transportation (Non-Medical): No  Physical Activity: Insufficiently Active   Days of Exercise per Week: 3 days   Minutes of Exercise per Session: 40 min  Stress: No Stress Concern Present   Feeling of Stress : Not at all  Social Connections: Moderately Integrated   Frequency of Communication with Friends and Family: More than three times a week   Frequency of Social Gatherings with Friends and Family: More than three times a week   Attends Religious Services: More than 4 times per year   Active Member of [employer] or  Organizations: No   Attends Music therapist: Never   Marital Status: Married    Tobacco Counseling Ready to quit: Not Answered Counseling given: Not Answered   Clinical Intake:  Pre-visit preparation completed: Yes  Pain : 0-10 Pain Score: 8  Pain Type: Chronic pain Pain Location: Back Pain Descriptors / Indicators: Burning, Constant, Sharp Pain Onset: More than a month ago Effect of Pain on Daily Activities: yes     Nutritional Risks: None Diabetes: No  How often do you need to have someone help you when you read instructions, pamphlets, or other written materials from your doctor or pharmacy?: 1 - Never  Diabetic?  noi  Interpreter Needed?: No  Information entered by :: Leroy Kennedy LPN   Activities of Daily Living In your present state of health, do you have any difficulty performing the following activities: 04/09/2021 04/24/2020  Hearing? N N  Vision? N N  Difficulty concentrating or making decisions? N N  Walking or climbing stairs? N Y  Dressing or bathing? N Y  Doing errands, shopping? N N  Preparing Food and eating ? N -  Using the Toilet? N -  In the past six months, have you accidently leaked urine? N -  Do you have problems with loss of bowel control? N -  Managing your Medications? N -  Managing your Finances? N -  Housekeeping or managing your Housekeeping? N -  Some recent data might be hidden    Patient Care Team: Valerie Roys, DO as PCP - General (Family Medicine)  Indicate any recent Medical Services you may have received from other than [facility] providers in the past year (date may be approximate).     Assessment:   This is a routine wellness examination for [facility]  Hearing/Vision screen Hearing Screening - Comments:: No trouble hearing Vision Screening - Comments:: [facility] [address]  Goes every two years  Dietary issues and exercise activities discussed: Current Exercise Habits: Home exercise routine, Type  of exercise: walking, Time (Minutes): 20, Frequency (Times/Week): 4, Weekly Exercise (Minutes/Week): 80, Intensity: Mild   Goals Addressed             This Visit's Progress    Patient Stated       No goals       Depression Screen PHQ 2/9 Scores 04/09/2021 04/24/2020 04/07/2020 02/15/2020 04/06/2019 08/21/2017 02/20/2017  PHQ - 2 Score 0 0 0 0 0 0 0  PHQ- 9 Score - - - 0 - - -    Fall Risk Fall Risk  04/09/2021 04/24/2020 04/07/2020 02/15/2020 04/06/2019  Falls in the past year? 0 0 0 0 0  Number falls in  past yr: 0 0 - 0 0  Injury with Fall? 0 0 - 0 0  Risk for fall due to : - No Fall Risks Medication side effect No Fall Risks -  Follow up Falls evaluation completed;Falls prevention discussed;Education provided Falls evaluation completed Falls evaluation completed;Education provided;Falls prevention discussed Falls evaluation completed -    FALL RISK PREVENTION PERTAINING TO THE HOME:  Any stairs in or around the home? No  If so, are there any without handrails? No  Home free of loose throw rugs in walkways, pet beds, electrical cords, etc? Yes  Adequate lighting in your home to reduce risk of falls? Yes   ASSISTIVE DEVICES UTILIZED TO PREVENT FALLS:  Life alert? No  Use of a cane, walker or w/c? No  Grab bars in the bathroom? Yes  Shower chair or bench in shower? No  Elevated toilet seat or a handicapped toilet? No   TIMED UP AND GO:  Was the test performed? No .    Cognitive Function:  Normal cognitive status assessed by direct observation by this Nurse Health Advisor. No abnormalities found.       6CIT Screen 04/07/2020 04/06/2019  What Year? 0 points 0 points  What month? 0 points 0 points  What time? 0 points 0 points  Count back from 20 0 points 0 points  Months in reverse 0 points 0 points  Repeat phrase 6 points 2 points  Total Score 6 2    Immunizations Immunization History  Administered Date(s) Administered   Fluad Quad(high Dose 65+)  01/11/2019, 01/24/2020, 01/22/2021   Influenza, High Dose Seasonal PF 02/01/2016, 02/02/2018   Influenza,inj,Quad PF,6+ Mos 01/31/2015   Pneumococcal Conjugate-13 08/31/2014   Pneumococcal Polysaccharide-23 04/06/2019   Tdap 12/03/2010     TDAP status: Due, Education has been provided regarding the importance of this vaccine. Advised may receive this vaccine at local pharmacy or Health Dept. Aware to provide a copy of the vaccination record if obtained from local pharmacy or Health Dept. Verbalized acceptance and understanding.  Flu Vaccine status: Up to date  Pneumococcal vaccine status: Up to date  Covid-19 vaccine status: Declined, Education has been provided regarding the importance of this vaccine but patient still declined. Advised may receive this vaccine at local pharmacy or Health Dept.or vaccine clinic. Aware to provide a copy of the vaccination record if obtained from local pharmacy or Health Dept. Verbalized acceptance and understanding.  Qualifies for Shingles Vaccine? Yes   Zostavax completed No   Shingrix Completed?: No.    Education has been provided regarding the importance of this vaccine. Patient has been advised to call insurance company to determine out of pocket expense if they have not yet received this vaccine. Advised may also receive vaccine at local pharmacy or Health Dept. Verbalized acceptance and understanding.  Screening Tests Health Maintenance  Topic Date Due   TETANUS/TDAP  12/02/2020   COVID-19 Vaccine (1) 04/25/2021 (Originally 07/25/1951)   Zoster Vaccines- Shingrix (1 of 2) 07/08/2021 (Originally 01/26/1999)   COLONOSCOPY (Pts 45-51yrs Insurance coverage will need to be confirmed)  03/19/2027   Pneumonia Vaccine 53+ Years old  Completed   INFLUENZA VACCINE  Completed   Hepatitis C Screening  Completed   HPV VACCINES  Aged Out    Health Maintenance  Health Maintenance Due  Topic Date Due   TETANUS/TDAP  12/02/2020    Colorectal cancer  screening: Type of screening: Colonoscopy. Completed 2018. Repeat every 10 years  Lung Cancer Screening: (Low Dose CT Chest  recommended if Age 68-80 years, 68 pack-year currently smoking OR have quit w/in 15years.) does not qualify.   Lung Cancer Screening Referral:   Additional Screening:  Hepatitis C Screening: does not qualify; Completed 2017  Vision Screening: Recommended annual ophthalmology exams for early detection of glaucoma and other disorders of the eye. Is the patient up to date with their annual eye exam?  Yes  Who is the provider or what is the name of the office in which the patient attends annual eye exams? [facility] If pt is not established with a provider, would they like to be referred to a provider to establish care? No .   Dental Screening: Recommended annual dental exams for proper oral hygiene  Community Resource Referral / Chronic Care Management: CRR required this visit?  No   CCM required this visit?  No      Plan:     I have personally reviewed and noted the following in the patient's chart:   Medical and social history Use of alcohol, tobacco or illicit drugs  Current medications and supplements including opioid prescriptions. Patient is not currently taking opioid prescriptions. Functional ability and status Nutritional status Physical activity Advanced directives List of other physicians Hospitalizations, surgeries, and ER visits in previous 12 months Vitals Screenings to include cognitive, depression, and falls Referrals and appointments  In addition, I have reviewed and discussed with patient certain preventive protocols, quality metrics, and best practice recommendations. A written personalized care plan for preventive services as well as general preventive health recommendations were provided to patient.     Leroy Kennedy, LPN   20/25/4270   Nurse Notes:

## 2021-04-09 NOTE — Patient Instructions (Signed)
[name] , Thank you for taking time to come for your Medicare Wellness Visit. I appreciate your ongoing commitment to your health goals. Please review the following plan we discussed and let me know if I can assist you in the future.   Screening recommendations/referrals: Colonoscopy: up to date Recommended yearly ophthalmology/optometry visit for glaucoma screening and checkup Recommended yearly dental visit for hygiene and checkup   Vaccinations: Influenza vaccine: up to date Pneumococcal vaccine: up to date Tdap vaccine: Education provided Shingles vaccine: Education provided    Advanced directives: Education provided  Conditions/risks identified:   Next appointment: 05-24-2021 @ 8:00 am  [facility] 72 Years and Older, Male Preventive care refers to lifestyle choices and visits with your health care provider that can promote health and wellness. What does preventive care include? A yearly physical exam. This is also called an annual well check. Dental exams once or twice a year. Routine eye exams. Ask your health care provider how often you should have your eyes checked. Personal lifestyle choices, including: Daily care of your teeth and gums. Regular physical activity. Eating a healthy diet. Avoiding tobacco and drug use. Limiting alcohol use. Practicing safe sex. Taking low doses of aspirin every day. Taking vitamin and mineral supplements as recommended by your health care provider. What happens during an annual well check? The services and screenings done by your health care provider during your annual well check will depend on your age, overall health, lifestyle risk factors, and family history of disease. Counseling  Your health care provider may ask you questions about your: Alcohol use. Tobacco use. Drug use. Emotional well-being. Home and relationship well-being. Sexual activity. Eating habits. History of falls. Memory and ability to  understand (cognition). Work and work Statistician. Screening  You may have the following tests or measurements: Height, weight, and BMI. Blood pressure. Lipid and cholesterol levels. These may be checked every 5 years, or more frequently if you are over 72 years old. Skin check. Lung cancer screening. You may have this screening every year starting at age 48 if you have a 30-pack-year history of smoking and currently smoke or have quit within the past 15 years. Fecal occult blood test (FOBT) of the stool. You may have this test every year starting at age 43. Flexible sigmoidoscopy or colonoscopy. You may have a sigmoidoscopy every 5 years or a colonoscopy every 10 years starting at age 38. Prostate cancer screening. Recommendations will vary depending on your family history and other risks. Hepatitis C blood test. Hepatitis B blood test. Sexually transmitted disease (STD) testing. Diabetes screening. This is done by checking your blood sugar (glucose) after you have not eaten for a while (fasting). You may have this done every 1-3 years. Abdominal aortic aneurysm (AAA) screening. You may need this if you are a current or former smoker. Osteoporosis. You may be screened starting at age 63 if you are at high risk. Talk with your health care provider about your test results, treatment options, and if necessary, the need for more tests. Vaccines  Your health care provider may recommend certain vaccines, such as: Influenza vaccine. This is recommended every year. Tetanus, diphtheria, and acellular pertussis (Tdap, Td) vaccine. You may need a Td booster every 10 years. Zoster vaccine. You may need this after age 76. Pneumococcal 13-valent conjugate (PCV13) vaccine. One dose is recommended after age 17. Pneumococcal polysaccharide (PPSV23) vaccine. One dose is recommended after age 12. Talk to your health care provider about which screenings  and vaccines you need and how often you need them. This  information is not intended to replace advice given to you by your health care provider. Make sure you discuss any questions you have with your health care provider. Document Released: 05/12/2015 Document Revised: 01/03/2016 Document Reviewed: 02/14/2015 Elsevier Interactive Patient Education  2017 [place] Prevention in the Home Falls can cause injuries. They can happen to people of all ages. There are many things you can do to make your home safe and to help prevent falls. What can I do on the outside of my home? Regularly fix the edges of walkways and driveways and fix any cracks. Remove anything that might make you trip as you walk through a door, such as a raised step or threshold. Trim any bushes or trees on the path to your home. Use bright outdoor lighting. Clear any walking paths of anything that might make someone trip, such as rocks or tools. Regularly check to see if handrails are loose or broken. Make sure that both sides of any steps have handrails. Any raised decks and porches should have guardrails on the edges. Have any leaves, snow, or ice cleared regularly. Use sand or salt on walking paths during winter. Clean up any spills in your garage right away. This includes oil or grease spills. What can I do in the bathroom? Use night lights. Install grab bars by the toilet and in the tub and shower. Do not use towel bars as grab bars. Use non-skid mats or decals in the tub or shower. If you need to sit down in the shower, use a plastic, non-slip stool. Keep the floor dry. Clean up any water that spills on the floor as soon as it happens. Remove soap buildup in the tub or shower regularly. Attach bath mats securely with double-sided non-slip rug tape. Do not have throw rugs and other things on the floor that can make you trip. What can I do in the bedroom? Use night lights. Make sure that you have a light by your bed that is easy to reach. Do not use any sheets or  blankets that are too big for your bed. They should not hang down onto the floor. Have a firm chair that has side arms. You can use this for support while you get dressed. Do not have throw rugs and other things on the floor that can make you trip. What can I do in the kitchen? Clean up any spills right away. Avoid walking on wet floors. Keep items that you use a lot in easy-to-reach places. If you need to reach something above you, use a strong step stool that has a grab bar. Keep electrical cords out of the way. Do not use floor polish or wax that makes floors slippery. If you must use wax, use non-skid floor wax. Do not have throw rugs and other things on the floor that can make you trip. What can I do with my stairs? Do not leave any items on the stairs. Make sure that there are handrails on both sides of the stairs and use them. Fix handrails that are broken or loose. Make sure that handrails are as long as the stairways. Check any carpeting to make sure that it is firmly attached to the stairs. Fix any carpet that is loose or worn. Avoid having throw rugs at the top or bottom of the stairs. If you do have throw rugs, attach them to the floor with carpet tape.  Make sure that you have a light switch at the top of the stairs and the bottom of the stairs. If you do not have them, ask someone to add them for you. What else can I do to help prevent falls? Wear shoes that: Do not have high heels. Have rubber bottoms. Are comfortable and fit you well. Are closed at the toe. Do not wear sandals. If you use a stepladder: Make sure that it is fully opened. Do not climb a closed stepladder. Make sure that both sides of the stepladder are locked into place. Ask someone to hold it for you, if possible. Clearly mark and make sure that you can see: Any grab bars or handrails. First and last steps. Where the edge of each step is. Use tools that help you move around (mobility aids) if they are  needed. These include: Canes. Walkers. Scooters. Crutches. Turn on the lights when you go into a dark area. Replace any light bulbs as soon as they burn out. Set up your furniture so you have a clear path. Avoid moving your furniture around. If any of your floors are uneven, fix them. If there are any pets around you, be aware of where they are. Review your medicines with your doctor. Some medicines can make you feel dizzy. This can increase your chance of falling. Ask your doctor what other things that you can do to help prevent falls. This information is not intended to replace advice given to you by your health care provider. Make sure you discuss any questions you have with your health care provider. Document Released: 02/09/2009 Document Revised: 09/21/2015 Document Reviewed: 05/20/2014 Elsevier Interactive Patient Education  2017 [employer].

## 2021-04-09 NOTE — Telephone Encounter (Signed)
Requested Prescriptions  Pending Prescriptions Disp Refills  . losartan (COZAAR) 100 MG tablet [Pharmacy Med Name: Losartan Potassium 100 MG Oral Tablet] 90 tablet 0    Sig: TAKE 1 TABLET BY MOUTH  DAILY     Cardiovascular:  Angiotensin Receptor Blockers Passed - 04/09/2021 10:34 AM      Passed - Cr in normal range and within 180 days    Creatinine, Ser  Date Value Ref Range Status  04/02/2021 1.00 0.76 - 1.27 mg/dL Final         Passed - K in normal range and within 180 days    Potassium  Date Value Ref Range Status  04/02/2021 3.9 3.5 - 5.2 mmol/L Final         Passed - Patient is not pregnant      Passed - Last BP in normal range    BP Readings from Last 1 Encounters:  03/02/21 134/70         Passed - Valid encounter within last 6 months    Recent Outpatient Visits          4 months ago Primary hypertension   Beauregard, Megan P, DO   5 months ago Primary hypertension   Jackson, [place], DO   9 months ago Primary hypertension   [employer], Gracey, DO   9 months ago Primary hypertension   [employer], Megan P, DO   10 months ago Dyspnea on exertion   Shaniko, Scheryl Darter, NP      Future Appointments            In 3 days Agbor-Etang, Aaron Edelman, MD [facility]   In 1 month T[place]in, Barb Merino, DO [employer], [place]

## 2021-04-11 DIAGNOSIS — M47816 Spondylosis without myelopathy or radiculopathy, lumbar region: Secondary | ICD-10-CM | POA: Diagnosis not present

## 2021-04-12 ENCOUNTER — Encounter: Payer: Self-pay | Admitting: Cardiology

## 2021-04-12 ENCOUNTER — Ambulatory Visit: Payer: Medicare Other | Admitting: Cardiology

## 2021-04-12 ENCOUNTER — Other Ambulatory Visit: Payer: Self-pay

## 2021-04-12 VITALS — BP 130/64 | HR 72 | Ht 68.0 in | Wt 213.0 lb

## 2021-04-12 DIAGNOSIS — I10 Essential (primary) hypertension: Secondary | ICD-10-CM

## 2021-04-12 DIAGNOSIS — R600 Localized edema: Secondary | ICD-10-CM | POA: Diagnosis not present

## 2021-04-12 DIAGNOSIS — E7800 Pure hypercholesterolemia, unspecified: Secondary | ICD-10-CM

## 2021-04-12 DIAGNOSIS — R0600 Dyspnea, unspecified: Secondary | ICD-10-CM | POA: Diagnosis not present

## 2021-04-12 MED ORDER — IVABRADINE HCL 5 MG PO TABS: 10.0000 mg | ORAL_TABLET | Freq: Once | ORAL | 0 refills | Status: AC

## 2021-04-12 MED ORDER — METOPROLOL TARTRATE 100 MG PO TABS: 100.0000 mg | ORAL_TABLET | Freq: Once | ORAL | 0 refills | Status: DC

## 2021-04-12 NOTE — Patient Instructions (Addendum)
Medication Instructions:  Your physician recommends that you continue on your current medications as directed. Please refer to the Current Medication list given to you today.  *If you need a refill on your cardiac medications before your next appointment, please call your pharmacy*   Lab Work:  BMP drawn today in office  If you have labs (blood work) drawn today and your tests are completely normal, you will receive your results only by: [place] (if you have MyChart) OR A paper copy in the mail If you have any lab test that is abnormal or we need to change your treatment, we will call you to review the results.   Testing/Procedures:   Your cardiac CT will be scheduled at:  [facility] [address] [place], [place] [address] [phone]  Thursday 04/26/21  at 8:00 AM   Please arrive 15 mins early for check-in and test prep.    Please follow these instructions carefully (unless otherwise directed):  Hold all erectile dysfunction medications at least 3 days (72 hrs) prior to test.  On the Night Before the Test: Be sure to Drink plenty of water. Do not consume any caffeinated/decaffeinated beverages or chocolate 12 hours prior to your test.    On the Day of the Test: Drink plenty of water until 1 hour prior to the test. Do not eat any food 4 hours prior to the test. You may take your regular medications prior to the test.  Take metoprolol (Lopressor) 100 MG two hours prior to test. Take Ivabradine (Corlanor) 10 MG two hours prior to test. HOLD furosemide (LASIX)morning of the test.    After the Test: Drink plenty of water. After receiving IV contrast, you may experience a mild flushed feeling. This is normal. On occasion, you may experience a mild rash up to 24 hours after the test. This is not dangerous. If this occurs, you can take Benadryl 25 mg and increase your fluid intake. If you experience trouble  breathing, this can be serious. If it is severe call 911 IMMEDIATELY. If it is mild, please call our office. If you take any of these medications: Glipizide/Metformin, Avandament, Glucavance, please do not take 48 hours after completing test unless otherwise instructed.  Please allow 2-4 weeks for scheduling of routine cardiac CTs. Some insurance companies require a pre-authorization which may delay scheduling of this test.   For non-scheduling related questions, please contact the cardiac imaging nurse navigator should you have any questions/concerns: Marchia Bond, Cardiac Imaging Nurse Navigator [name], Cardiac Imaging Nurse Navigator [facility] Direct Office Dial: [phone]   For scheduling needs, including cancellations and rescheduling, please call [place], [phone].     Follow-Up: At [facility], you and your health needs are our priority.  As part of our continuing mission to provide you with exceptional heart care, we have created designated Provider Care Teams.  These Care Teams include your primary Cardiologist (physician) and Advanced Practice Providers (APPs -  Physician Assistants and Nurse Practitioners) who all work together to provide you with the care you need, when you need it.  We recommend signing up for the patient portal called "MyChart".  Sign up information is provided on this After Visit Summary.  MyChart is used to connect with patients for Virtual Visits (Telemedicine).  Patients are able to view lab/test results, encounter notes, upcoming appointments, etc.  Non-urgent messages can be sent to your provider as well.   To learn more  about what you can do with MyChart, go to [email or web address].    Your next appointment:   4 week(s)  The format for your next appointment:   In Person  Provider:   You may see Kate Sable, MD or one of the following Advanced Practice Providers on your designated Care Team:    Murray Hodgkins, NP Christell Faith, PA-C Cadence Kathlen Mody, [place]    Other Instructions

## 2021-04-12 NOTE — Progress Notes (Signed)
Cardiology Office Note:    Date:  04/12/2021   ID:  Arthur Richardson, DOB 24-Jul-1950, MRN [id number]  PCP:  Valerie Roys, DO   [facility] Providers Cardiologist:  None     Referring MD: Valerie Roys, DO   Chief Complaint  Patient presents with   Other    Follow up post ECHO -- Meds reviewed verbally with patient.     History of Present Illness:    Arthur Richardson is a 70 y.o. male with a hx of hypertension, hyperlipidemia, former smoker x5 years who presents for follow-up.  Last seen due to edema and shortness of breath.  Patient was started on Lasix for edema.  Had good response but stopped due to requiring urination.  Currently takes Lasix as needed.  Diagnosed with COVID-19 about a year ago, developed some neurological impairment in his hands and left foot.  Also pulled a muscle in his left foot.  Left ankle appears more swollen than right.  Still has shortness of breath with exertion.  Endorsed snoring, when he lays on his back.  Declines sleep study, would not want CPAP mask.  Endorses eating lots of salty foods including steaks, ham, fat back.  Prior notes Patient was infected with COVID-17 May 2020.  He states having left arm sensation issues, left arm and left leg weakness since then.  Followed up with neurology, brain MRI was normal, currently undergoing nerve testing.       Past Medical History:  Diagnosis Date   Hyperlipidemia    Hypertension     Past Surgical History:  Procedure Laterality Date   LITHOTRIPSY  09/1998 & 07/29/1999   Due to nephrolithiasis   PROSTATE SURGERY     Biopsy= Negative   TONSILLECTOMY      Current Medications: Current Meds  Medication Sig   amLODipine (NORVASC) 5 MG tablet TAKE 1 TABLET BY MOUTH  DAILY   aspirin EC 81 MG tablet Take 81 mg by mouth daily.   atorvastatin (LIPITOR) 40 MG tablet Take 1 tablet (40 mg total) by mouth daily.   furosemide (LASIX) 40 MG tablet Take 40 mg by mouth as needed.    gabapentin (NEURONTIN) 100 MG capsule Take 200 mg by mouth daily as needed.   ivabradine (CORLANOR) 5 MG TABS tablet Take 2 tablets (10 mg total) by mouth once for 1 dose. Take 2 hours prior to your CT scan.   levothyroxine (SYNTHROID) 50 MCG tablet Take 1 tablet (50 mcg total) by mouth daily before breakfast.   losartan (COZAAR) 100 MG tablet TAKE 1 TABLET BY MOUTH  DAILY   metoprolol tartrate (LOPRESSOR) 100 MG tablet Take 1 tablet (100 mg total) by mouth once for 1 dose. Take 2 hours prior to your CT scan.   omeprazole (PRILOSEC) 40 MG capsule TAKE 1 CAPSULE BY MOUTH  DAILY   tamsulosin (FLOMAX) 0.4 MG CAPS capsule TAKE 1 CAPSULE BY MOUTH  DAILY     Allergies:   Penicillins   Social History   Socioeconomic History   Marital status: Married    Spouse name: Not on file   Number of children: Not on file   Years of education: Not on file   Highest education level: Not on file  Occupational History   Not on file  Tobacco Use   Smoking status: Former    Types: Cigarettes    Quit date: 01/31/1980    Years since quitting: 41.2   Smokeless tobacco: Current  Types: Chew  Vaping Use   Vaping Use: Never used  Substance and Sexual Activity   Alcohol use: Yes    Comment: 3-4 beers daily   Drug use: No   Sexual activity: Yes  Other Topics Concern   Not on file  Social History Narrative   Not on file   Social Determinants of Health   Financial Resource Strain: Low Risk    Difficulty of Paying Living Expenses: Not hard at all  Food Insecurity: No Food Insecurity   Worried About Charity fundraiser in the Last Year: Never true   Ran Out of Food in the Last Year: Never true  Transportation Needs: No Transportation Needs   Lack of Transportation (Medical): No   Lack of Transportation (Non-Medical): No  Physical Activity: Insufficiently Active   Days of Exercise per Week: 3 days   Minutes of Exercise per Session: 40 min  Stress: No Stress Concern Present   Feeling of Stress : Not  at all  Social Connections: Moderately Integrated   Frequency of Communication with Friends and Family: More than three times a week   Frequency of Social Gatherings with Friends and Family: More than three times a week   Attends Religious Services: More than 4 times per year   Active Member of [employer] or Organizations: No   Attends Archivist Meetings: Never   Marital Status: Married     Family History: The patient's family history includes Cancer in his sister; Stroke in his father.  ROS:   Please see the history of present illness.     All other systems reviewed and are negative.  EKGs/Labs/Other Studies Reviewed:    The following studies were reviewed today:   EKG:  EKG not  ordered today.   Recent Labs: 06/08/2020: Hemoglobin 16.0; Platelets 347 10/23/2020: ALT 42; TSH 1.780 04/02/2021: BUN 16; Creatinine, Ser 1.00; Potassium 3.9; Sodium 141  Recent Lipid Panel    Component Value Date/Time   CHOL 204 (H) 10/23/2020 0950   TRIG 129 10/23/2020 0950   HDL 64 10/23/2020 0950   CHOLHDL 3.4 02/23/2018 1306   LDLCALC 117 (H) 10/23/2020 0950     Risk Assessment/Calculations:          Physical Exam:    VS:  BP 130/64 (BP Location: Left Arm, Patient Position: Sitting, Cuff Size: Normal)    Pulse 72    Ht 5\' 8"  (1.727 m)    Wt 213 lb (96.6 kg)    SpO2 98%    BMI 32.39 kg/m     Wt Readings from Last 3 Encounters:  04/12/21 213 lb (96.6 kg)  03/02/21 218 lb (98.9 kg)  11/21/20 211 lb 3.2 oz (95.8 kg)     GEN:  Well nourished, well developed in no acute distress HEENT: Normal NECK: No JVD; No carotid bruits CARDIAC: RRR, no murmurs, rubs, gallops RESPIRATORY:  Clear to auscultation without rales, wheezing or rhonchi  ABDOMEN: Soft, non-tender, non-distended MUSCULOSKELETAL:  1+ edema; worse on the left.  No deformity  SKIN: Warm and dry NEUROLOGIC:  Alert and oriented x 3 PSYCHIATRIC:  Normal affect   ASSESSMENT:    1. Dyspnea, unspecified type   2.  Primary hypertension   3. Pure hypercholesterolemia   4. Leg edema     PLAN:    In order of problems listed above:  Dyspnea on exertion,.  Echo 03/2021 reviewed by myself EF 60 to 72%, normal diastolic function.  Risk factors include hypertension, hyperlipidemia.  This could be an anginal equivalent.  Get coronary CTA to evaluate presence of CAD.  He snores, OSA eval recommended, states he would not want to wear a CPAP mask if diagnosed. Hypertension, BP controlled.  Continue losartan, amlodipine 5 mg daily.  Low-salt diet again emphasized. Hyperlipidemia, 10-year ASCVD risk 23.4%.  Continue Lipitor 40 mg daily. leg edema, Lasix as needed.  Follow-up after coronary CTA.       Medication Adjustments/Labs and Tests Ordered: Current medicines are reviewed at length with the patient today.  Concerns regarding medicines are outlined above.  Orders Placed This Encounter  Procedures   CT CORONARY MORPH W/CTA COR W/SCORE W/CA W/CM &/OR WO/CM     Meds ordered this encounter  Medications   metoprolol tartrate (LOPRESSOR) 100 MG tablet    Sig: Take 1 tablet (100 mg total) by mouth once for 1 dose. Take 2 hours prior to your CT scan.    Dispense:  1 tablet    Refill:  0   ivabradine (CORLANOR) 5 MG TABS tablet    Sig: Take 2 tablets (10 mg total) by mouth once for 1 dose. Take 2 hours prior to your CT scan.    Dispense:  2 tablet    Refill:  0    Do not run through insurance. Patient will use good RX coupon and pay cash.      Patient Instructions  Medication Instructions:  Your physician recommends that you continue on your current medications as directed. Please refer to the Current Medication list given to you today.  *If you need a refill on your cardiac medications before your next appointment, please call your pharmacy*   Lab Work:  BMP drawn today in office  If you have labs (blood work) drawn today and your tests are completely normal, you will receive your results only  by: [place] (if you have MyChart) OR A paper copy in the mail If you have any lab test that is abnormal or we need to change your treatment, we will call you to review the results.   Testing/Procedures:   Your cardiac CT will be scheduled at:  [facility] [address] [place],  [address] [phone]  Thursday 04/26/21  at 8:00 AM   Please arrive 15 mins early for check-in and test prep.    Please follow these instructions carefully (unless otherwise directed):  Hold all erectile dysfunction medications at least 3 days (72 hrs) prior to test.  On the Night Before the Test: Be sure to Drink plenty of water. Do not consume any caffeinated/decaffeinated beverages or chocolate 12 hours prior to your test.    On the Day of the Test: Drink plenty of water until 1 hour prior to the test. Do not eat any food 4 hours prior to the test. You may take your regular medications prior to the test.  Take metoprolol (Lopressor) 100 MG two hours prior to test. Take Ivabradine (Corlanor) 10 MG two hours prior to test. HOLD furosemide (LASIX)morning of the test.    After the Test: Drink plenty of water. After receiving IV contrast, you may experience a mild flushed feeling. This is normal. On occasion, you may experience a mild rash up to 24 hours after the test. This is not dangerous. If this occurs, you can take Benadryl 25 mg and increase your fluid intake. If you experience trouble breathing, this can be serious. If it is severe call 911 IMMEDIATELY. If it is  mild, please call our office. If you take any of these medications: Glipizide/Metformin, Avandament, Glucavance, please do not take 48 hours after completing test unless otherwise instructed.  Please allow 2-4 weeks for scheduling of routine cardiac CTs. Some insurance companies require a pre-authorization which may delay scheduling of this test.   For  non-scheduling related questions, please contact the cardiac imaging nurse navigator should you have any questions/concerns: Marchia Bond, Cardiac Imaging Nurse Navigator [name], Cardiac Imaging Nurse Navigator [facility] Direct Office Dial: [phone]   For scheduling needs, including cancellations and rescheduling, please call [place], [phone].     Follow-Up: At [facility], you and your health needs are our priority.  As part of our continuing mission to provide you with exceptional heart care, we have created designated Provider Care Teams.  These Care Teams include your primary Cardiologist (physician) and Advanced Practice Providers (APPs -  Physician Assistants and Nurse Practitioners) who all work together to provide you with the care you need, when you need it.  We recommend signing up for the patient portal called "MyChart".  Sign up information is provided on this After Visit Summary.  MyChart is used to connect with patients for Virtual Visits (Telemedicine).  Patients are able to view lab/test results, encounter notes, upcoming appointments, etc.  Non-urgent messages can be sent to your provider as well.   To learn more about what you can do with MyChart, go to [email or web address].    Your next appointment:   4 week(s)  The format for your next appointment:   In Person  Provider:   You may see Kate Sable, MD or one of the following Advanced Practice Providers on your designated Care Team:   Murray Hodgkins, NP Christell Faith, PA-C Cadence Kathlen Mody, [place]    Other Instructions    Signed, Kate Sable, MD  04/12/2021 1:17 PM    [place]

## 2021-04-25 ENCOUNTER — Telehealth (HOSPITAL_COMMUNITY): Payer: Self-pay | Admitting: *Deleted

## 2021-04-25 NOTE — Telephone Encounter (Signed)
Patient returning call regarding upcoming cardiac imaging study; pt verbalizes understanding of appt date/time, parking situation and where to check in, pre-test NPO status and medications ordered, and verified current allergies; name and call back number provided for further questions should they arise  [name] RN Navigator Cardiac Imaging [facility] [phone] office [phone] cell  Patient to take 100mg  metoprolol tartrate and 10mg  ivabradine.

## 2021-04-25 NOTE — Telephone Encounter (Signed)
Attempted to call patient regarding upcoming cardiac CT appointment. Voicemail box full and unable to leave a message.  Briellah Baik RN Navigator Cardiac Imaging [facility] [phone] Office [phone] Cell  

## 2021-04-26 ENCOUNTER — Telehealth (HOSPITAL_COMMUNITY): Payer: Self-pay | Admitting: *Deleted

## 2021-04-26 ENCOUNTER — Ambulatory Visit: Admission: RE | Admit: 2021-04-26 | Payer: Medicare Other | Source: Ambulatory Visit

## 2021-04-26 NOTE — Telephone Encounter (Signed)
Received a phone call from patient saying that he is sick on his stomach and unable to take pre-medication for CCTA. Patient requesting to be rescheduled. Informed him that scheduler will be reach out to reschedule. Patient grateful for call.  [name] RN Navigator Cardiac Imaging [facility] [phone] Office [phone] Cell

## 2021-05-10 ENCOUNTER — Other Ambulatory Visit (INDEPENDENT_AMBULATORY_CARE_PROVIDER_SITE_OTHER): Payer: Medicare Other

## 2021-05-10 ENCOUNTER — Other Ambulatory Visit (HOSPITAL_COMMUNITY): Payer: Self-pay | Admitting: *Deleted

## 2021-05-10 ENCOUNTER — Telehealth: Payer: Self-pay | Admitting: Cardiology

## 2021-05-10 ENCOUNTER — Other Ambulatory Visit: Payer: Self-pay

## 2021-05-10 ENCOUNTER — Telehealth (HOSPITAL_COMMUNITY): Payer: Self-pay | Admitting: Emergency Medicine

## 2021-05-10 DIAGNOSIS — R0600 Dyspnea, unspecified: Secondary | ICD-10-CM

## 2021-05-10 DIAGNOSIS — Z01812 Encounter for preprocedural laboratory examination: Secondary | ICD-10-CM

## 2021-05-10 NOTE — Telephone Encounter (Signed)
Called the patient to schedule his pre-test (CCTA) lab appt for a bmp. Patient will make his way to our office in the next 20 min. Lab appt scheduled.

## 2021-05-10 NOTE — Telephone Encounter (Signed)
Reaching out to patient to offer assistance regarding upcoming cardiac imaging study; pt verbalizes understanding of appt date/time, parking situation and where to check in, pre-test NPO status and medications ordered, and verified current allergies; name and call back number provided for further questions should they arise Marchia Bond RN Navigator Cardiac Imaging [facility] [phone] office [phone] cell   Denies iv issues 100mg  metoprolol tart + 10mg  ivabradine  Arrival 8am  Getting BMP today

## 2021-05-11 LAB — BASIC METABOLIC PANEL
BUN/Creatinine Ratio: 15 (ref 10–24)
BUN: 14 mg/dL (ref 8–27)
CO2: 22 mmol/L (ref 20–29)
Calcium: 9.5 mg/dL (ref 8.6–10.2)
Chloride: 102 mmol/L (ref 96–106)
Creatinine, Ser: 0.94 mg/dL (ref 0.76–1.27)
Glucose: 89 mg/dL (ref 70–99)
Potassium: 4.6 mmol/L (ref 3.5–5.2)
Sodium: 140 mmol/L (ref 134–144)
eGFR: 86 mL/min/{1.73_m2} (ref >59)

## 2021-05-14 ENCOUNTER — Ambulatory Visit
Admission: RE | Admit: 2021-05-14 | Discharge: 2021-05-14 | Disposition: A | Discharge status: Home / Self Care | Payer: Medicare Other | Source: Ambulatory Visit | Attending: Cardiology | Admitting: Cardiology

## 2021-05-14 ENCOUNTER — Other Ambulatory Visit: Payer: Self-pay

## 2021-05-14 DIAGNOSIS — I700 Atherosclerosis of aorta: Secondary | ICD-10-CM | POA: Insufficient documentation

## 2021-05-14 DIAGNOSIS — R0600 Dyspnea, unspecified: Secondary | ICD-10-CM | POA: Insufficient documentation

## 2021-05-14 DIAGNOSIS — I2510 Atherosclerotic heart disease of native coronary artery without angina pectoris: Secondary | ICD-10-CM

## 2021-05-14 MED ORDER — IOHEXOL 350 MG/ML SOLN
100.0000 mL | Freq: Once | INTRAVENOUS | Status: AC | PRN
  Administered 2021-05-14: 100 mL via INTRAVENOUS

## 2021-05-14 MED ORDER — NITROGLYCERIN 0.4 MG SL SUBL
0.8000 mg | SUBLINGUAL_TABLET | Freq: Once | SUBLINGUAL | Status: AC
  Administered 2021-05-14: 0.8 mg via SUBLINGUAL

## 2021-05-15 ENCOUNTER — Encounter: Payer: Self-pay | Admitting: Cardiology

## 2021-05-15 ENCOUNTER — Ambulatory Visit: Payer: Medicare Other | Admitting: Cardiology

## 2021-05-15 VITALS — BP 122/68 | HR 59 | Ht 68.0 in | Wt 219.0 lb

## 2021-05-15 DIAGNOSIS — I2510 Atherosclerotic heart disease of native coronary artery without angina pectoris: Secondary | ICD-10-CM

## 2021-05-15 DIAGNOSIS — E7800 Pure hypercholesterolemia, unspecified: Secondary | ICD-10-CM

## 2021-05-15 DIAGNOSIS — I10 Essential (primary) hypertension: Secondary | ICD-10-CM

## 2021-05-15 DIAGNOSIS — R0600 Dyspnea, unspecified: Secondary | ICD-10-CM | POA: Diagnosis not present

## 2021-05-15 NOTE — Progress Notes (Signed)
Cardiology Office Note:    Date:  05/15/2021   ID:  Arthur Richardson, DOB Jun 24, 1950, MRN [id number]  PCP:  Valerie Roys, DO   [facility] Providers Cardiologist:  None     Referring MD: Valerie Roys, DO   Chief Complaint  Patient presents with   Other    Follow up post CT -- Meds reviewed verbally with patient.     History of Present Illness:    Arthur Richardson is a 71 y.o. male with a hx of hypertension, hyperlipidemia, former smoker x5 years who presents for follow-up.  Last seen due to edema and shortness of breath.  Due to risk factors, coronary CTA was ordered to evaluate CAD.  Previous echo with no gross structural abnormalities, EF 60 to 65%.  Endorsed shortness of breath, swelling.  Declines sleep study.  Feels okay otherwise, no new concerns at this time.  Prior notes Echocardiogram 03/2021 EF 60 to 65%.  Diastolic function normal. Patient was infected with COVID-17 May 2020.  He states having left arm sensation issues, left arm and left leg weakness since then.  Followed up with neurology, brain MRI was normal, currently undergoing nerve testing.       Past Medical History:  Diagnosis Date   Hyperlipidemia    Hypertension     Past Surgical History:  Procedure Laterality Date   LITHOTRIPSY  09/1998 & 07/29/1999   Due to nephrolithiasis   PROSTATE SURGERY     Biopsy= Negative   TONSILLECTOMY      Current Medications: Current Meds  Medication Sig   amLODipine (NORVASC) 5 MG tablet TAKE 1 TABLET BY MOUTH  DAILY   aspirin EC 81 MG tablet Take 81 mg by mouth daily.   atorvastatin (LIPITOR) 40 MG tablet Take 1 tablet (40 mg total) by mouth daily.   dutasteride (AVODART) 0.5 MG capsule Take 1 capsule (0.5 mg total) by mouth daily.   furosemide (LASIX) 40 MG tablet Take 40 mg by mouth as needed.   gabapentin (NEURONTIN) 100 MG capsule Take 200 mg by mouth daily as needed.   levothyroxine (SYNTHROID) 50 MCG tablet Take 1 tablet (50 mcg  total) by mouth daily before breakfast.   losartan (COZAAR) 100 MG tablet TAKE 1 TABLET BY MOUTH  DAILY   omeprazole (PRILOSEC) 40 MG capsule TAKE 1 CAPSULE BY MOUTH  DAILY   tamsulosin (FLOMAX) 0.4 MG CAPS capsule TAKE 1 CAPSULE BY MOUTH  DAILY     Allergies:   Penicillins   Social History   Socioeconomic History   Marital status: Married    Spouse name: Not on file   Number of children: Not on file   Years of education: Not on file   Highest education level: Not on file  Occupational History   Not on file  Tobacco Use   Smoking status: Former    Types: Cigarettes    Quit date: 01/31/1980    Years since quitting: 41.3   Smokeless tobacco: Current    Types: Chew  Vaping Use   Vaping Use: Never used  Substance and Sexual Activity   Alcohol use: Yes    Comment: 3-4 beers daily   Drug use: No   Sexual activity: Yes  Other Topics Concern   Not on file  Social History Narrative   Not on file   Social Determinants of Health   Financial Resource Strain: Low Risk    Difficulty of Paying Living Expenses: Not hard at all  Food  Insecurity: No Food Insecurity   Worried About Charity fundraiser in the Last Year: Never true   Ran Out of Food in the Last Year: Never true  Transportation Needs: No Transportation Needs   Lack of Transportation (Medical): No   Lack of Transportation (Non-Medical): No  Physical Activity: Insufficiently Active   Days of Exercise per Week: 3 days   Minutes of Exercise per Session: 40 min  Stress: No Stress Concern Present   Feeling of Stress : Not at all  Social Connections: Moderately Integrated   Frequency of Communication with Friends and Family: More than three times a week   Frequency of Social Gatherings with Friends and Family: More than three times a week   Attends Religious Services: More than 4 times per year   Active Member of [employer] or Organizations: No   Attends Archivist Meetings: Never   Marital Status: Married      Family History: The patient's family history includes Cancer in his sister; Stroke in his father.  ROS:   Please see the history of present illness.     All other systems reviewed and are negative.  EKGs/Labs/Other Studies Reviewed:    The following studies were reviewed today:   EKG:  EKG not  ordered today.   Recent Labs: 06/08/2020: Hemoglobin 16.0; Platelets 347 10/23/2020: ALT 42; TSH 1.780 05/10/2021: BUN 14; Creatinine, Ser 0.94; Potassium 4.6; Sodium 140  Recent Lipid Panel    Component Value Date/Time   CHOL 204 (H) 10/23/2020 0950   TRIG 129 10/23/2020 0950   HDL 64 10/23/2020 0950   CHOLHDL 3.4 02/23/2018 1306   LDLCALC 117 (H) 10/23/2020 0950     Risk Assessment/Calculations:          Physical Exam:    VS:  BP 122/68 (BP Location: Left Arm, Patient Position: Sitting, Cuff Size: Normal)    Pulse (!) 59    Ht 5\' 8"  (1.727 m)    Wt 219 lb (99.3 kg)    SpO2 98%    BMI 33.30 kg/m     Wt Readings from Last 3 Encounters:  05/15/21 219 lb (99.3 kg)  04/12/21 213 lb (96.6 kg)  03/02/21 218 lb (98.9 kg)     GEN:  Well nourished, well developed in no acute distress HEENT: Normal NECK: No JVD; No carotid bruits CARDIAC: RRR, no murmurs, rubs, gallops RESPIRATORY:  Clear to auscultation without rales, wheezing or rhonchi  ABDOMEN: Soft, non-tender, non-distended MUSCULOSKELETAL:  1+ edema; worse on the left.  No deformity  SKIN: Warm and dry NEUROLOGIC:  Alert and oriented x 3 PSYCHIATRIC:  Normal affect   ASSESSMENT:    1. Coronary artery disease involving native coronary artery of native heart without angina pectoris   2. Dyspnea, unspecified type   3. Primary hypertension   4. Pure hypercholesterolemia      PLAN:    In order of problems listed above:  Mild nonobstructive CAD.  No chest pain.  Continue aspirin 81 mg, Lipitor 40 mg daily. Echo 03/2021 reviewed by myself EF 60 to 01%, normal diastolic function.  Coronary CTA mild proximal LAD  stenosis 25%.  Calcium score 65.8.   He snores, OSA eval recommended, patient declined.  States he would not want to wear a CPAP mask if diagnosed. Hypertension, BP controlled.  Continue losartan, amlodipine 5 mg daily.   Hyperlipidemia, Continue Lipitor 40 mg daily.  Repeat lipid panel in 2 months.  Follow-up in 6 months  Medication Adjustments/Labs and Tests Ordered: Current medicines are reviewed at length with the patient today.  Concerns regarding medicines are outlined above.  Orders Placed This Encounter  Procedures   Lipid panel     No orders of the defined types were placed in this encounter.     Patient Instructions  Medication Instructions:  Your physician recommends that you continue on your current medications as directed. Please refer to the Current Medication list given to you today.  *If you need a refill on your cardiac medications before your next appointment, please call your pharmacy*   Lab Work:  Your physician recommends that you return for a FASTING lipid profile: In 2 Months  - You will need to be fasting. Please do not have anything to eat or drink after midnight the morning you have the lab work. You may only have water or black coffee with no cream or sugar.   We will call you closer to 3 Months and schedule you in our office for this lab draw.    Testing/Procedures: None ordered   Follow-Up: At [facility], you and your health needs are our priority.  As part of our continuing mission to provide you with exceptional heart care, we have created designated Provider Care Teams.  These Care Teams include your primary Cardiologist (physician) and Advanced Practice Providers (APPs -  Physician Assistants and Nurse Practitioners) who all work together to provide you with the care you need, when you need it.  We recommend signing up for the patient portal called "MyChart".  Sign up information is provided on this After Visit Summary.  MyChart  is used to connect with patients for Virtual Visits (Telemedicine).  Patients are able to view lab/test results, encounter notes, upcoming appointments, etc.  Non-urgent messages can be sent to your provider as well.   To learn more about what you can do with MyChart, go to [email or web address].    Your next appointment:   6 month(s)  The format for your next appointment:   In Person  Provider:   You may see Kate Sable, MD or one of the following Advanced Practice Providers on your designated Care Team:   Murray Hodgkins, NP Christell Faith, PA-C    Other Instructions     Signed, Kate Sable, MD  05/15/2021 9:44 AM    [place]

## 2021-05-15 NOTE — Patient Instructions (Addendum)
Medication Instructions:  Your physician recommends that you continue on your current medications as directed. Please refer to the Current Medication list given to you today.  *If you need a refill on your cardiac medications before your next appointment, please call your pharmacy*   Lab Work:  Your physician recommends that you return for a FASTING lipid profile: In 2 Months  - You will need to be fasting. Please do not have anything to eat or drink after midnight the morning you have the lab work. You may only have water or black coffee with no cream or sugar.   We will call you closer to 3 Months and schedule you in our office for this lab draw.    Testing/Procedures: None ordered   Follow-Up: At [facility], you and your health needs are our priority.  As part of our continuing mission to provide you with exceptional heart care, we have created designated Provider Care Teams.  These Care Teams include your primary Cardiologist (physician) and Advanced Practice Providers (APPs -  Physician Assistants and Nurse Practitioners) who all work together to provide you with the care you need, when you need it.  We recommend signing up for the patient portal called "MyChart".  Sign up information is provided on this After Visit Summary.  MyChart is used to connect with patients for Virtual Visits (Telemedicine).  Patients are able to view lab/test results, encounter notes, upcoming appointments, etc.  Non-urgent messages can be sent to your provider as well.   To learn more about what you can do with MyChart, go to [email or web address].    Your next appointment:   6 month(s)  The format for your next appointment:   In Person  Provider:   You may see Kate Sable, MD or one of the following Advanced Practice Providers on your designated Care Team:   Murray Hodgkins, NP Christell Faith, PA-C    Other Instructions

## 2021-05-24 ENCOUNTER — Encounter: Payer: Self-pay | Admitting: Family Medicine

## 2021-05-24 ENCOUNTER — Ambulatory Visit (INDEPENDENT_AMBULATORY_CARE_PROVIDER_SITE_OTHER): Payer: Medicare Other | Admitting: Family Medicine

## 2021-05-24 ENCOUNTER — Other Ambulatory Visit: Payer: Self-pay

## 2021-05-24 VITALS — BP 137/84 | HR 62 | Temp 98.2°F | Ht 68.0 in | Wt 219.2 lb

## 2021-05-24 DIAGNOSIS — K209 Esophagitis, unspecified without bleeding: Secondary | ICD-10-CM

## 2021-05-24 DIAGNOSIS — Z0000 Encounter for general adult medical examination without abnormal findings: Secondary | ICD-10-CM | POA: Diagnosis not present

## 2021-05-24 DIAGNOSIS — S41101A Unspecified open wound of right upper arm, initial encounter: Secondary | ICD-10-CM

## 2021-05-24 DIAGNOSIS — E7849 Other hyperlipidemia: Secondary | ICD-10-CM | POA: Diagnosis not present

## 2021-05-24 DIAGNOSIS — I10 Essential (primary) hypertension: Secondary | ICD-10-CM

## 2021-05-24 DIAGNOSIS — K2270 Barrett's esophagus without dysplasia: Secondary | ICD-10-CM

## 2021-05-24 DIAGNOSIS — Z23 Encounter for immunization: Secondary | ICD-10-CM

## 2021-05-24 DIAGNOSIS — N138 Other obstructive and reflux uropathy: Secondary | ICD-10-CM

## 2021-05-24 DIAGNOSIS — N401 Enlarged prostate with lower urinary tract symptoms: Secondary | ICD-10-CM

## 2021-05-24 DIAGNOSIS — E039 Hypothyroidism, unspecified: Secondary | ICD-10-CM

## 2021-05-24 LAB — URINALYSIS, ROUTINE W REFLEX MICROSCOPIC
Bilirubin, UA: NEGATIVE
Glucose, UA: NEGATIVE
Ketones, UA: NEGATIVE
Leukocytes,UA: NEGATIVE
Nitrite, UA: NEGATIVE
Protein,UA: NEGATIVE
RBC, UA: NEGATIVE
Specific Gravity, UA: 1.025 (ref 1.005–1.030)
Urobilinogen, Ur: 0.2 mg/dL (ref 0.2–1.0)
pH, UA: 6 (ref 5.0–7.5)

## 2021-05-24 LAB — MICROALBUMIN, URINE WAIVED
Creatinine, Urine Waived: 200 mg/dL (ref 10–300)
Microalb, Ur Waived: 10 mg/L (ref 0–19)
Microalb/Creat Ratio: <30 mg/g (ref <30)

## 2021-05-24 MED ORDER — DUTASTERIDE 0.5 MG PO CAPS
0.5000 mg | ORAL_CAPSULE | Freq: Every day | ORAL | 1 refills | Status: DC
Start: 2021-05-24 — End: 2021-07-26

## 2021-05-24 MED ORDER — OMEPRAZOLE 40 MG PO CPDR: 40.0000 mg | DELAYED_RELEASE_CAPSULE | Freq: Every day | ORAL | 1 refills | Status: DC

## 2021-05-24 MED ORDER — ATORVASTATIN CALCIUM 40 MG PO TABS: 40.0000 mg | ORAL_TABLET | Freq: Every day | ORAL | 1 refills | Status: DC

## 2021-05-24 MED ORDER — TAMSULOSIN HCL 0.4 MG PO CAPS: 0.4000 mg | ORAL_CAPSULE | Freq: Every day | ORAL | 3 refills | Status: DC

## 2021-05-24 MED ORDER — AMLODIPINE BESYLATE 5 MG PO TABS: 5.0000 mg | ORAL_TABLET | Freq: Every day | ORAL | 1 refills | Status: DC

## 2021-05-24 MED ORDER — LOSARTAN POTASSIUM 100 MG PO TABS: 100.0000 mg | ORAL_TABLET | Freq: Every day | ORAL | 1 refills | Status: DC

## 2021-05-24 NOTE — Progress Notes (Signed)
BP 137/84    Pulse 62    Temp 98.2 F (36.8 C)    Ht 5' 8"  (1.727 m)    Wt 219 lb 3.2 oz (99.4 kg)    SpO2 99%    BMI 33.33 kg/m    Subjective:    Patient ID: Arthur Richardson, male    DOB: February 02, 1951, 71 y.o.   MRN: [id number]  HPI: Arthur Richardson is a 71 y.o. male presenting on 05/24/2021 for comprehensive medical examination. Current medical complaints include:  HYPERTENSION / HYPERLIPIDEMIA Satisfied with current treatment? yes Duration of hypertension: chronic BP monitoring frequency: not checking BP medication side effects: no Past BP meds: amlodipine, lasix, losartan Duration of hyperlipidemia: chronic Cholesterol medication side effects: no Cholesterol supplements: none Past cholesterol medications: atorvastatin Medication compliance: excellent compliance Aspirin: yes Recent stressors: no Recurrent headaches: no Visual changes: no Palpitations: no Dyspnea: no Chest pain: no Lower extremity edema: no Dizzy/lightheaded: no  HYPOTHYROIDISM Thyroid control status:controlled Satisfied with current treatment? yes Medication side effects: no Medication compliance: excellent compliance Etiology of hypothyroidism:  Recent dose adjustment:no Fatigue: no Cold intolerance: no Heat intolerance: no Weight gain: no Weight loss: no Constipation: no Diarrhea/loose stools: no Palpitations: no Lower extremity edema: no Anxiety/depressed mood: no  BPH BPH status: uncontrolled Satisfied with current treatment?: no Medication side effects: no Medication compliance: excellent compliance Duration: chronic Nocturia: 2-3x per night Urinary frequency:yes Incomplete voiding: yes Urgency: yes Weak urinary stream: yes Straining to start stream: yes Dysuria: no Onset: gradual Severity: moderate  He currently lives with:wife Interim Problems from his last visit: no  Depression Screen done today and results listed below:  Depression screen [facility] 2/9 05/24/2021  04/09/2021 04/24/2020 04/07/2020 02/15/2020  Decreased Interest 0 0 0 0 0  Down, Depressed, Hopeless 0 0 0 0 0  PHQ - 2 Score 0 0 0 0 0  Altered sleeping 0 - - - 0  Tired, decreased energy 0 - - - 0  Change in appetite 0 - - - 0  Feeling bad or failure about yourself  0 - - - 0  Trouble concentrating 0 - - - 0  Moving slowly or fidgety/restless 0 - - - 0  Suicidal thoughts 0 - - - 0  PHQ-9 Score 0 - - - 0  Difficult doing work/chores - - - - Not difficult at all    Past Medical History:  Past Medical History:  Diagnosis Date   Hyperlipidemia    Hypertension     Surgical History:  Past Surgical History:  Procedure Laterality Date   LITHOTRIPSY  09/1998 & 07/29/1999   Due to nephrolithiasis   PROSTATE SURGERY     Biopsy= Negative   TONSILLECTOMY      Medications:  Current Outpatient Medications on File Prior to Visit  Medication Sig   aspirin EC 81 MG tablet Take 81 mg by mouth daily.   furosemide (LASIX) 40 MG tablet Take 40 mg by mouth as needed.   gabapentin (NEURONTIN) 100 MG capsule Take 200 mg by mouth daily as needed.   levothyroxine (SYNTHROID) 50 MCG tablet Take 1 tablet (50 mcg total) by mouth daily before breakfast.   No current facility-administered medications on file prior to visit.    Allergies:  Allergies  Allergen Reactions   Penicillins     Social History:  Social History   Socioeconomic History   Marital status: Married    Spouse name: Not on file   Number of children: Not on file  Years of education: Not on file   Highest education level: Not on file  Occupational History   Not on file  Tobacco Use   Smoking status: Former    Types: Cigarettes    Quit date: 01/31/1980    Years since quitting: 41.3   Smokeless tobacco: Current    Types: Chew  Vaping Use   Vaping Use: Never used  Substance and Sexual Activity   Alcohol use: Yes    Comment: 3-4 beers daily   Drug use: No   Sexual activity: Yes  Other Topics Concern   Not on  file  Social History Narrative   Not on file   Social Determinants of Health   Financial Resource Strain: Low Risk    Difficulty of Paying Living Expenses: Not hard at all  Food Insecurity: No Food Insecurity   Worried About Charity fundraiser in the Last Year: Never true   [place] in the Last Year: Never true  Transportation Needs: No Transportation Needs   Lack of Transportation (Medical): No   Lack of Transportation (Non-Medical): No  Physical Activity: Insufficiently Active   Days of Exercise per Week: 3 days   Minutes of Exercise per Session: 40 min  Stress: No Stress Concern Present   Feeling of Stress : Not at all  Social Connections: Moderately Integrated   Frequency of Communication with Friends and Family: More than three times a week   Frequency of Social Gatherings with Friends and Family: More than three times a week   Attends Religious Services: More than 4 times per year   Active Member of [employer] or Organizations: No   Attends Music therapist: Never   Marital Status: Married  Human resources officer Violence: Not At Risk   Fear of Current or Ex-Partner: No   Emotionally Abused: No   Physically Abused: No   Sexually Abused: No   Social History   Tobacco Use  Smoking Status Former   Types: Cigarettes   Quit date: 01/31/1980   Years since quitting: 41.3  Smokeless Tobacco Current   Types: Chew   Social History   Substance and Sexual Activity  Alcohol Use Yes   Comment: 3-4 beers daily    Family History:  Family History  Problem Relation Age of Onset   Stroke Father    Cancer Sister        Pancreatic Cancer    Past medical history, surgical history, medications, allergies, family history and social history reviewed with patient today and changes made to appropriate areas of the chart.   Review of Systems  Constitutional: Negative.   HENT: Negative.    Eyes:  Positive for blurred vision. Negative for double vision, photophobia,  pain, discharge and redness.  Respiratory:  Positive for shortness of breath. Negative for cough, hemoptysis, sputum production and wheezing.   Cardiovascular:  Positive for leg swelling. Negative for chest pain, palpitations, orthopnea, claudication and PND.  Gastrointestinal: Negative.   Genitourinary: Negative.        Incomplete emptying   Musculoskeletal: Negative.   Skin: Negative.   Neurological: Negative.   Endo/Heme/Allergies: Negative.   Psychiatric/Behavioral: Negative.    All other ROS negative except what is listed above and in the HPI.      Objective:    BP 137/84    Pulse 62    Temp 98.2 F (36.8 C)    Ht 5' 8"  (1.727 m)    Wt 219 lb 3.2 oz (99.4 kg)  SpO2 99%    BMI 33.33 kg/m   Wt Readings from Last 3 Encounters:  05/24/21 219 lb 3.2 oz (99.4 kg)  05/15/21 219 lb (99.3 kg)  04/12/21 213 lb (96.6 kg)    Physical Exam Vitals and nursing note reviewed.  Constitutional:      General: He is not in acute distress.    Appearance: Normal appearance. He is obese. He is not ill-appearing, toxic-appearing or diaphoretic.  HENT:     Head: Normocephalic and atraumatic.     Right Ear: Tympanic membrane, ear canal and external ear normal. There is no impacted cerumen.     Left Ear: Tympanic membrane, ear canal and external ear normal. There is no impacted cerumen.     Nose: Nose normal. No congestion or rhinorrhea.     Mouth/Throat:     Mouth: Mucous membranes are moist.     Pharynx: Oropharynx is clear. No oropharyngeal exudate or posterior oropharyngeal erythema.  Eyes:     General: No scleral icterus.       Right eye: No discharge.        Left eye: No discharge.     Extraocular Movements: Extraocular movements intact.     Conjunctiva/sclera: Conjunctivae normal.     Pupils: Pupils are equal, round, and reactive to light.  Neck:     Vascular: No carotid bruit.  Cardiovascular:     Rate and Rhythm: Normal rate and regular rhythm.     Pulses: Normal pulses.      Heart sounds: No murmur heard.   No friction rub. No gallop.  Pulmonary:     Effort: Pulmonary effort is normal. No respiratory distress.     Breath sounds: Normal breath sounds. No stridor. No wheezing, rhonchi or rales.  Chest:     Chest wall: No tenderness.  Abdominal:     General: Abdomen is flat. Bowel sounds are normal. There is no distension.     Palpations: Abdomen is soft. There is no mass.     Tenderness: There is no abdominal tenderness. There is no right CVA tenderness, left CVA tenderness, guarding or rebound.     Hernia: A hernia (umbilical) is present.  Genitourinary:    Comments: Genital exam deferred with shared decision making Musculoskeletal:        General: No swelling, tenderness, deformity or signs of injury.     Cervical back: Normal range of motion and neck supple. No rigidity. No muscular tenderness.     Right lower leg: No edema.     Left lower leg: No edema.  Lymphadenopathy:     Cervical: No cervical adenopathy.  Skin:    General: Skin is warm and dry.     Capillary Refill: Capillary refill takes less than 2 seconds.     Coloration: Skin is not jaundiced or pale.     Findings: No bruising, erythema, lesion or rash.  Neurological:     General: No focal deficit present.     Mental Status: He is alert and oriented to person, place, and time.     Cranial Nerves: No cranial nerve deficit.     Sensory: No sensory deficit.     Motor: No weakness.     Coordination: Coordination normal.     Gait: Gait normal.     Deep Tendon Reflexes: Reflexes normal.  Psychiatric:        Mood and Affect: Mood normal.        Behavior: Behavior normal.  Thought Content: Thought content normal.        Judgment: Judgment normal.    Results for orders placed or performed in visit on 05/24/21  Comprehensive metabolic panel  Result Value Ref Range   Glucose 91 70 - 99 mg/dL   BUN 17 8 - 27 mg/dL   Creatinine, Ser 1.05 0.76 - 1.27 mg/dL   eGFR 75 >59 mL/min/1.73    BUN/Creatinine Ratio 16 10 - 24   Sodium 138 134 - 144 mmol/L   Potassium 4.6 3.5 - 5.2 mmol/L   Chloride 101 96 - 106 mmol/L   CO2 24 20 - 29 mmol/L   Calcium 9.6 8.6 - 10.2 mg/dL   Total Protein 6.7 6.0 - 8.5 g/dL   Albumin 4.4 3.7 - 4.7 g/dL   Globulin, Total 2.3 1.5 - 4.5 g/dL   Albumin/Globulin Ratio 1.9 1.2 - 2.2   Bilirubin Total 0.7 0.0 - 1.2 mg/dL   Alkaline Phosphatase 130 (H) 44 - 121 IU/L   AST 25 0 - 40 IU/L   ALT 31 0 - 44 IU/L  CBC with Differential/Platelet  Result Value Ref Range   WBC 6.4 3.4 - 10.8 x10E3/uL   RBC 4.52 4.14 - 5.80 x10E6/uL   Hemoglobin 14.4 13.0 - 17.7 g/dL   Hematocrit 42.0 37.5 - 51.0 %   MCV 93 79 - 97 fL   MCH 31.9 26.6 - 33.0 pg   MCHC 34.3 31.5 - 35.7 g/dL   RDW 13.2 11.6 - 15.4 %   Platelets 239 150 - 450 x10E3/uL   Neutrophils 50 Not Estab. %   Lymphs 34 Not Estab. %   Monocytes 10 Not Estab. %   Eos 4 Not Estab. %   Basos 1 Not Estab. %   Neutrophils Absolute 3.2 1.4 - 7.0 x10E3/uL   Lymphocytes Absolute 2.2 0.7 - 3.1 x10E3/uL   Monocytes Absolute 0.7 0.1 - 0.9 x10E3/uL   EOS (ABSOLUTE) 0.3 0.0 - 0.4 x10E3/uL   Basophils Absolute 0.1 0.0 - 0.2 x10E3/uL   Immature Granulocytes 1 Not Estab. %   Immature Grans (Abs) 0.0 0.0 - 0.1 x10E3/uL  Lipid Panel w/o Chol/HDL Ratio  Result Value Ref Range   Cholesterol, Total 143 100 - 199 mg/dL   Triglycerides 80 0 - 149 mg/dL   HDL 55 >39 mg/dL   VLDL Cholesterol Cal 15 5 - 40 mg/dL   LDL Chol Calc (NIH) 73 0 - 99 mg/dL  PSA  Result Value Ref Range   Prostate Specific Ag, Serum 2.5 0.0 - 4.0 ng/mL  TSH  Result Value Ref Range   TSH 2.450 0.450 - 4.500 uIU/mL  Urinalysis, Routine w reflex microscopic  Result Value Ref Range   Specific Gravity, UA 1.025 1.005 - 1.030   pH, UA 6.0 5.0 - 7.5   Color, UA Yellow Yellow   Appearance Ur Clear Clear   Leukocytes,UA Negative Negative   Protein,UA Negative Negative/Trace   Glucose, UA Negative Negative   Ketones, UA Negative Negative    RBC, UA Negative Negative   Bilirubin, UA Negative Negative   Urobilinogen, Ur 0.2 0.2 - 1.0 mg/dL   Nitrite, UA Negative Negative  Microalbumin, Urine Waived  Result Value Ref Range   Microalb, Ur Waived 10 0 - 19 mg/L   Creatinine, Urine Waived 200 10 - 300 mg/dL   Microalb/Creat Ratio <30 <30 mg/g      Assessment & Plan:   Problem List Items Addressed This Visit  Cardiovascular and Mediastinum   Hypertension    Under good control on current regimen. Continue current regimen. Continue to monitor. Call with any concerns. Refills given. Labs drawn today.       Relevant Medications   amLODipine (NORVASC) 5 MG tablet   atorvastatin (LIPITOR) 40 MG tablet   losartan (COZAAR) 100 MG tablet   Other Relevant Orders   Comprehensive metabolic panel (Completed)   CBC with Differential/Platelet (Completed)     Digestive   Barrett's esophagus with esophagitis    Under good control on current regimen. Continue current regimen. Continue to monitor. Call with any concerns. Refills given. Labs drawn today.       Relevant Medications   omeprazole (PRILOSEC) 40 MG capsule   Other Relevant Orders   Comprehensive metabolic panel (Completed)   CBC with Differential/Platelet (Completed)     Endocrine   Hypothyroidism    Rechecking labs today. Await results. Treat as needed.       Relevant Orders   Comprehensive metabolic panel (Completed)   CBC with Differential/Platelet (Completed)   TSH (Completed)   Microalbumin, Urine Waived (Completed)     Genitourinary   BPH with obstruction/lower urinary tract symptoms    Not under good control. Will get him into urology. Await their input. Call with any concerns.       Relevant Medications   dutasteride (AVODART) 0.5 MG capsule   tamsulosin (FLOMAX) 0.4 MG CAPS capsule   Other Relevant Orders   Ambulatory referral to Urology   Comprehensive metabolic panel (Completed)   CBC with Differential/Platelet (Completed)   PSA  (Completed)   Urinalysis, Routine w reflex microscopic (Completed)     Other   Hyperlipidemia    Under good control on current regimen. Continue current regimen. Continue to monitor. Call with any concerns. Refills given. Labs drawn today.       Relevant Medications   amLODipine (NORVASC) 5 MG tablet   atorvastatin (LIPITOR) 40 MG tablet   losartan (COZAAR) 100 MG tablet   Other Relevant Orders   Comprehensive metabolic panel (Completed)   CBC with Differential/Platelet (Completed)   Lipid Panel w/o Chol/HDL Ratio (Completed)   Other Visit Diagnoses     Routine general medical examination at a health care facility    -  Primary   Vaccines up to date/declined. Screening labs checked today. Colonoscopy up to date. Continue diet and exercise. Continue to monitor. Call with any concerns.    Essential hypertension       Relevant Medications   amLODipine (NORVASC) 5 MG tablet   atorvastatin (LIPITOR) 40 MG tablet   losartan (COZAAR) 100 MG tablet   Other Relevant Orders   Comprehensive metabolic panel (Completed)   CBC with Differential/Platelet (Completed)   Open wound of right upper arm, initial encounter       Due for Td- given today. Healing well.    Relevant Orders   Td : Tetanus/diphtheria >7yo Preservative  free (Completed)        Discussed aspirin prophylaxis for myocardial infarction prevention and decision was made to continue ASA  LABORATORY TESTING:  Health maintenance labs ordered today as discussed above.   The natural history of prostate cancer and ongoing controversy regarding screening and potential treatment outcomes of prostate cancer has been discussed with the patient. The meaning of a false positive PSA and a false negative PSA has been discussed. He indicates understanding of the limitations of this screening test and wishes to proceed with screening PSA testing.  IMMUNIZATIONS:   - Tdap: Tetanus vaccination status reviewed: Td vaccination indicated  and given today. - Influenza: Up to date - Pneumovax: Up to date - Prevnar: Up to date - COVID: Refused - HPV: Not applicable - Shingrix vaccine: Refused  SCREENING: - Colonoscopy: Up to date  Discussed with patient purpose of the colonoscopy is to detect colon cancer at curable precancerous or early stages   PATIENT COUNSELING:    Sexuality: Discussed sexually transmitted diseases, partner selection, use of condoms, avoidance of unintended pregnancy  and contraceptive alternatives.   Advised to avoid cigarette smoking.  I discussed with the patient that most people either abstain from alcohol or drink within safe limits (<=14/week and <=4 drinks/occasion for males, <=7/weeks and <= 3 drinks/occasion for females) and that the risk for alcohol disorders and other health effects rises proportionally with the number of drinks per week and how often a drinker exceeds daily limits.  Discussed cessation/primary prevention of drug use and availability of treatment for abuse.   Diet: Encouraged to adjust caloric intake to maintain  or achieve ideal body weight, to reduce intake of dietary saturated fat and total fat, to limit sodium intake by avoiding high sodium foods and not adding table salt, and to maintain adequate dietary potassium and calcium preferably from fresh fruits, vegetables, and low-fat dairy products.    stressed the importance of regular exercise  Injury prevention: Discussed safety belts, safety helmets, smoke detector, smoking near bedding or upholstery.   Dental health: Discussed importance of regular tooth brushing, flossing, and dental visits.   Follow up plan: NEXT PREVENTATIVE PHYSICAL DUE IN 1 YEAR. Return in about 6 months (around 11/21/2021).

## 2021-05-25 LAB — CBC WITH DIFFERENTIAL/PLATELET
Basophils Absolute: 0.1 10*3/uL (ref 0.0–0.2)
Basos: 1 %
EOS (ABSOLUTE): 0.3 10*3/uL (ref 0.0–0.4)
Eos: 4 %
Hematocrit: 42 % (ref 37.5–51.0)
Hemoglobin: 14.4 g/dL (ref 13.0–17.7)
Immature Grans (Abs): 0 10*3/uL (ref 0.0–0.1)
Immature Granulocytes: 1 %
Lymphocytes Absolute: 2.2 10*3/uL (ref 0.7–3.1)
Lymphs: 34 %
MCH: 31.9 pg (ref 26.6–33.0)
MCHC: 34.3 g/dL (ref 31.5–35.7)
MCV: 93 fL (ref 79–97)
Monocytes Absolute: 0.7 10*3/uL (ref 0.1–0.9)
Monocytes: 10 %
Neutrophils Absolute: 3.2 10*3/uL (ref 1.4–7.0)
Neutrophils: 50 %
Platelets: 239 10*3/uL (ref 150–450)
RBC: 4.52 x10E6/uL (ref 4.14–5.80)
RDW: 13.2 % (ref 11.6–15.4)
WBC: 6.4 10*3/uL (ref 3.4–10.8)

## 2021-05-25 LAB — COMPREHENSIVE METABOLIC PANEL
ALT: 31 IU/L (ref 0–44)
AST: 25 IU/L (ref 0–40)
Albumin/Globulin Ratio: 1.9 (ref 1.2–2.2)
Albumin: 4.4 g/dL (ref 3.7–4.7)
Alkaline Phosphatase: 130 IU/L — ABNORMAL HIGH (ref 44–121)
BUN/Creatinine Ratio: 16 (ref 10–24)
BUN: 17 mg/dL (ref 8–27)
Bilirubin Total: 0.7 mg/dL (ref 0.0–1.2)
CO2: 24 mmol/L (ref 20–29)
Calcium: 9.6 mg/dL (ref 8.6–10.2)
Chloride: 101 mmol/L (ref 96–106)
Creatinine, Ser: 1.05 mg/dL (ref 0.76–1.27)
Globulin, Total: 2.3 g/dL (ref 1.5–4.5)
Glucose: 91 mg/dL (ref 70–99)
Potassium: 4.6 mmol/L (ref 3.5–5.2)
Sodium: 138 mmol/L (ref 134–144)
Total Protein: 6.7 g/dL (ref 6.0–8.5)
eGFR: 75 mL/min/{1.73_m2} (ref >59)

## 2021-05-25 LAB — LIPID PANEL W/O CHOL/HDL RATIO
Cholesterol, Total: 143 mg/dL (ref 100–199)
HDL: 55 mg/dL (ref >39)
LDL Chol Calc (NIH): 73 mg/dL (ref 0–99)
Triglycerides: 80 mg/dL (ref 0–149)
VLDL Cholesterol Cal: 15 mg/dL (ref 5–40)

## 2021-05-25 LAB — PSA: Prostate Specific Ag, Serum: 2.5 ng/mL (ref 0.0–4.0)

## 2021-05-25 LAB — TSH: TSH: 2.45 u[IU]/mL (ref 0.450–4.500)

## 2021-05-26 MED ORDER — LEVOTHYROXINE SODIUM 50 MCG PO TABS: 50.0000 ug | ORAL_TABLET | Freq: Every day | ORAL | 3 refills | Status: DC

## 2021-05-26 NOTE — Assessment & Plan Note (Signed)
Under good control on current regimen. Continue current regimen. Continue to monitor. Call with any concerns. Refills given. Labs drawn today.   

## 2021-05-26 NOTE — Assessment & Plan Note (Signed)
Not under good control. Will get him into urology. Await their input. Call with any concerns.

## 2021-05-26 NOTE — Assessment & Plan Note (Signed)
Rechecking labs today. Await results. Treat as needed.  °

## 2021-05-28 ENCOUNTER — Encounter: Payer: Self-pay | Admitting: Family Medicine

## 2021-06-05 ENCOUNTER — Ambulatory Visit: Payer: Medicare Other | Admitting: Urology

## 2021-06-14 ENCOUNTER — Other Ambulatory Visit: Payer: Self-pay

## 2021-06-14 ENCOUNTER — Encounter: Payer: Self-pay | Admitting: Urology

## 2021-06-14 ENCOUNTER — Ambulatory Visit: Payer: Medicare Other | Admitting: Urology

## 2021-06-14 VITALS — BP 156/83 | HR 70 | Ht 68.0 in | Wt 219.0 lb

## 2021-06-14 DIAGNOSIS — N3281 Overactive bladder: Secondary | ICD-10-CM

## 2021-06-14 DIAGNOSIS — Z125 Encounter for screening for malignant neoplasm of prostate: Secondary | ICD-10-CM | POA: Diagnosis not present

## 2021-06-14 DIAGNOSIS — N138 Other obstructive and reflux uropathy: Secondary | ICD-10-CM | POA: Diagnosis not present

## 2021-06-14 DIAGNOSIS — N401 Enlarged prostate with lower urinary tract symptoms: Secondary | ICD-10-CM | POA: Diagnosis not present

## 2021-06-14 LAB — MICROSCOPIC EXAMINATION
Bacteria, UA: NONE SEEN
Epithelial Cells (non renal): NONE SEEN /hpf (ref 0–10)
RBC, Urine: NONE SEEN /hpf (ref 0–2)

## 2021-06-14 LAB — URINALYSIS, COMPLETE
Bilirubin, UA: NEGATIVE
Glucose, UA: NEGATIVE
Ketones, UA: NEGATIVE
Leukocytes,UA: NEGATIVE
Nitrite, UA: NEGATIVE
Protein,UA: NEGATIVE
RBC, UA: NEGATIVE
Specific Gravity, UA: 1.025 (ref 1.005–1.030)
Urobilinogen, Ur: 1 mg/dL (ref 0.2–1.0)
pH, UA: 6.5 (ref 5.0–7.5)

## 2021-06-14 LAB — BLADDER SCAN AMB NON-IMAGING

## 2021-06-14 MED ORDER — OXYBUTYNIN CHLORIDE ER 10 MG PO TB24: 10.0000 mg | ORAL_TABLET | Freq: Every day | ORAL | 11 refills | Status: DC

## 2021-06-14 NOTE — Patient Instructions (Addendum)
Cut back on beer, soda, and diet drinks during the day, and especially in the evening as this contributes to urinary urgency and frequency.  Would also recommend wearing compression socks during the day as this can help with lower extremity swelling that then recirculate overnight and causes urination overnight.  Minimize fluids 3 to 4 hours before bedtime and urinate right before going to bed.    Your blood pressure medication amlodipine sometimes can cause lower extremity swelling, and would recommend talking to your primary care doctor to see if there is another alternative medication.   Overactive Bladder, Adult Overactive bladder is a condition in which a person has a sudden and frequent need to urinate. A person might also leak urine if he or she cannot get to the bathroom fast enough (urinary incontinence). Sometimes, symptoms can interfere with work or social activities. What are the causes? Overactive bladder is associated with poor nerve signals between your bladder and your brain. Your bladder may get the signal to empty before it is full. You may also have very sensitive muscles that make your bladder squeeze too soon. This condition may also be caused by other factors, such as: Medical conditions: Urinary tract infection. Infection of nearby tissues. Prostate enlargement. Bladder stones, inflammation, or tumors. Diabetes. Muscle or nerve weakness, especially from these conditions: A spinal cord injury. Stroke. Multiple sclerosis. Parkinson's disease. Other causes: Surgery on the uterus or urethra. Drinking too much caffeine or alcohol. Certain medicines, especially those that eliminate extra fluid in the body (diuretics). Constipation. What increases the risk? You may be at greater risk for overactive bladder if you: Are an older adult. Smoke. Are going through menopause. Have prostate problems. Have a neurological disease, such as stroke, dementia, Parkinson's disease, or  multiple sclerosis (MS). Eat or drink alcohol, spicy food, caffeine, and other things that irritate the bladder. Are overweight or obese. What are the signs or symptoms? Symptoms of this condition include a sudden, strong urge to urinate. Other symptoms include: Leaking urine. Urinating 8 or more times a day. Waking up to urinate 2 or more times overnight. How is this diagnosed? This condition may be diagnosed based on: Your symptoms and medical history. A physical exam. Blood or urine tests to check for possible causes, such as infection. You may also need to see a health care provider who specializes in urinary tract problems. This is called a urologist. How is this treated? Treatment for overactive bladder depends on the cause of your condition and whether it is mild or severe. Treatment may include: Bladder training, such as: Learning to control the urge to urinate by following a schedule to urinate at regular intervals. Doing Kegel exercises to strengthen the pelvic floor muscles that support your bladder. Special devices, such as: Biofeedback. This uses sensors to help you become aware of your body's signals. Electrical stimulation. This uses electrodes placed inside the body (implanted) or outside the body. These electrodes send gentle pulses of electricity to strengthen the nerves or muscles that control the bladder. Women may use a plastic device, called a pessary, that fits into the vagina and supports the bladder. Medicines, such as: Antibiotics to treat bladder infection. Antispasmodics to stop the bladder from releasing urine at the wrong time. Tricyclic antidepressants to relax bladder muscles. Injections of botulinum toxin type A directly into the bladder tissue to relax bladder muscles. Surgery, such as: A device may be implanted to help manage the nerve signals that control urination. An electrode may be implanted  to stimulate electrical signals in the bladder. A  procedure may be done to change the shape of the bladder. This is done only in very severe cases. Follow these instructions at home: Eating and drinking  Make diet or lifestyle changes recommended by your health care provider. These may include: Drinking fluids throughout the day and not only with meals. Cutting down on caffeine or alcohol. Eating a healthy and balanced diet to prevent constipation. This may include: Choosing foods that are high in fiber, such as beans, whole grains, and fresh fruits and vegetables. Limiting foods that are high in fat and processed sugars, such as fried and sweet foods. Lifestyle  Lose weight if needed. Do not use any products that contain nicotine or tobacco. These include cigarettes, chewing tobacco, and vaping devices, such as e-cigarettes. If you need help quitting, ask your health care provider. General instructions Take over-the-counter and prescription medicines only as told by your health care provider. If you were prescribed an antibiotic medicine, take it as told by your health care provider. Do not stop taking the antibiotic even if you start to feel better. Use any implants or pessary as told by your health care provider. If needed, wear pads to absorb urine leakage. Keep a log to track how much and when you drink, and when you need to urinate. This will help your health care provider monitor your condition. Keep all follow-up visits. This is important. Contact a health care provider if: You have a fever or chills. Your symptoms do not get better with treatment. Your pain and discomfort get worse. You have more frequent urges to urinate. Get help right away if: You are not able to control your bladder. Summary Overactive bladder refers to a condition in which a person has a sudden and frequent need to urinate. Several conditions may lead to an overactive bladder. Treatment for overactive bladder depends on the cause and severity of your  condition. Making lifestyle changes, doing Kegel exercises, keeping a log, and taking medicines can help with this condition. This information is not intended to replace advice given to you by your health care provider. Make sure you discuss any questions you have with your health care provider. Document Revised: 01/03/2020 Document Reviewed: 01/03/2020 Elsevier Patient Education  [place].

## 2021-06-14 NOTE — Progress Notes (Signed)
06/14/21 8:34 AM   Arthur Richardson 07-Apr-1951 [id number]  CC: BPH and urinary symptoms, PSA screening, history of nephrolithiasis  HPI: I saw Arthur Richardson and his wife today for the above issues.  He complains of at least 1 year of urinary urgency, frequency, urge incontinence, nocturia 3-4 times per night.  He has been on Flomax and dutasteride through PCP for at least a year.  It sounds like he stopped the Flomax at some point and he noticed a decrease stream and resume that.  He drinks 3-4 beers during the day as well as regular Coke and diet [facility].  He has significant lower extremity edema, and takes Lasix as needed.  He denies problems with his stream when taking the Flomax.  He denies any gross hematuria or dysuria.  IPSS score today is 13 with quality of life unhappy.  Urinalysis today is completely benign with 0-5 WBCs, 0 RBCs, no bacteria, no yeast, nitrite negative, no leukocytes.  PVR is normal at 40 mL.  PSA 2.5, corrected for finasteride 5 which is within the normal range for his age, and essentially stable over the last 5 years.  He reportedly has had a negative prostate biopsy in the past, but those records are not available to me.   PMH: Past Medical History:  Diagnosis Date   Hyperlipidemia    Hypertension     Surgical History: Past Surgical History:  Procedure Laterality Date   LITHOTRIPSY  09/1998 & 07/29/1999   Due to nephrolithiasis   PROSTATE SURGERY     Biopsy= Negative   TONSILLECTOMY     Family History: Family History  Problem Relation Age of Onset   Stroke Father    Cancer Sister        Pancreatic Cancer    Social History:  reports that he quit smoking about 41 years ago. His smoking use included cigarettes. His smokeless tobacco use includes chew. He reports current alcohol use. He reports that he does not use drugs.  Physical Exam: BP (!) 156/83 (BP Location: Left Arm, Patient Position: Sitting, Cuff Size: Large)    Pulse 70    Ht 5'  8" (1.727 m)    Wt 219 lb (99.3 kg)    BMI 33.30 kg/m    Constitutional:  Alert and oriented, No acute distress. Cardiovascular: No clubbing, cyanosis, or edema. Respiratory: Normal respiratory effort, no increased work of breathing. GI: Abdomen is soft, nontender, nondistended, no abdominal masses GU: Uncircumcised phallus, patent meatus, no lesions  Laboratory Data: Reviewed, see HPI  Pertinent Imaging: None to review  Assessment & Plan:   Comorbid 71 year old male with significant lower extremity edema on exam who presents with 1 to 2 years of worsening urinary urgency, frequency, urge incontinence, and nocturia.  I think his symptoms are primarily related to his high fluid intake of alcohol and diet soda during the day, and significant lower extremity edema likely contributing to his nocturia.  We focused on behavioral strategies at length including avoiding bladder irritants, addressing his lower extremity edema with compression stockings and considering discontinuing amlodipine as this could be a cause of his edema.  We discussed that overactive bladder (OAB) is not a disease, but is a symptom complex that is generally not life-threatening.  Symptoms typically include urinary urgency, frequency, and urge incontinence.  There are numerous treatment options, however there are risks and benefits with both medical and surgical management.  First-line treatment is behavioral therapies including bladder training, pelvic floor muscle training,  and fluid management.  Second line treatments include oral antimuscarinics(Ditropan er, Trospium) and beta-3 agonist (Mybetriq). There is typically a period of medication trial (4-8 weeks) to find the optimal therapy and dosing. If symptoms are bothersome despite the above management, third line options include intra-detrusor botox, peripheral tibial nerve stimulation (PTNS), and interstim (SNS). These are more invasive treatments with higher side effect  profile, but may improve quality of life for patients with severe OAB symptoms.   Consider alternative to amlodipine, as this could be contributing to his significant lower extremity edema Behavioral strategies discussed extensively Trial of oxybutynin 10 mg XL daily.  Okay to continue Flomax and dutasteride. RTC 6 months PVR and symptom check    Nickolas Madrid, MD 06/14/2021  [facility] [address], [place], [place] [address] [phone]

## 2021-07-23 ENCOUNTER — Other Ambulatory Visit: Payer: Self-pay

## 2021-07-23 MED ORDER — FUROSEMIDE 40 MG PO TABS: 40.0000 mg | ORAL_TABLET | ORAL | 3 refills | Status: DC | PRN

## 2021-07-26 ENCOUNTER — Ambulatory Visit: Payer: Self-pay

## 2021-07-26 ENCOUNTER — Ambulatory Visit: Payer: Medicare Other | Admitting: Urology

## 2021-07-26 ENCOUNTER — Encounter: Payer: Self-pay | Admitting: Urology

## 2021-07-26 VITALS — BP 134/77 | HR 67 | Ht 68.0 in | Wt 219.0 lb

## 2021-07-26 DIAGNOSIS — N3281 Overactive bladder: Secondary | ICD-10-CM

## 2021-07-26 DIAGNOSIS — N138 Other obstructive and reflux uropathy: Secondary | ICD-10-CM | POA: Diagnosis not present

## 2021-07-26 DIAGNOSIS — N401 Enlarged prostate with lower urinary tract symptoms: Secondary | ICD-10-CM

## 2021-07-26 LAB — BLADDER SCAN AMB NON-IMAGING

## 2021-07-26 NOTE — Progress Notes (Signed)
? ?  07/26/2021 ?1:05 PM  ? ?Arthur Richardson ?Sep 30, 1950 ?[id number] ? ?Reason for visit: Follow up BPH/OAB and urinary symptoms, PSA screening, history nephrolithiasis ? ?HPI: ?Comorbid 71 year old male with urinary symptoms consisting of urgency, frequency, occasional urge incontinence, nocturia 2-4 times per night.  At our last visit we realized he was drinking significant amount of soda, diet drinks, and alcohol, and we focused on behavioral strategies, and he was also interested in a trial of 10 mg XL oxybutynin daily.  He does note some improvement on that medication with improved urgency and frequency, and only rare urge incontinence.  Urinalysis at her last visit was benign, and PSA was 2.5(corrected for finasteride 5) which was stable and normal for his age, and he has a history of a negative prostate biopsy in the past.  PVR is normal today. ? ?It sounds like he has worked on cutting back on some of his bladder irritants, and thinks things have improved somewhat.  He would like to get off some of the medications, specifically the dutasteride and oxybutynin.  He stopped Flomax previously but noticed worsening urinary symptoms and he is amenable to continuing this.  We discussed the risk and benefits of medications at length, and I think this is a reasonable plan, but return precautions discussed at length. ? ?-Flomax refilled ?-Discontinued dutasteride and oxybutynin ?-Consider decreasing amlodipine or finding alternative medication with PCP as this could potentially be causing lower extremity edema and contributing to nocturia ?-RTC 1 year PVR and UA ? ?Billey Co, MD ? ?[facility] ?[address] ?[place], [place] [address] ?[phone] ? ?

## 2021-07-26 NOTE — Telephone Encounter (Signed)
Chief Complaint: sinus symptoms ?Symptoms: sinus headache, nasal congestion thick yellow ?Frequency: 1 week  ?Pertinent Negatives: Patient denies fever, cough ?Disposition: '[]'$ ED /'[]'$ Urgent Care (no appt availability in office) / '[x]'$ Appointment(In office/virtual)/ '[]'$  [facility]/ '[]'$ Home Care/ '[]'$ Refused Recommended Disposition /'[]'$ [facility]/ '[]'$  Follow-up with PCP ?Additional Notes: pt has an appt Monday but is asking if Zpak can be sent in today. He states he didn't want to come in earlier but to get medication to start helping. ? ?Summary: medication wanted for sinus symptoms  ? Pt is having sinus issues / pt has headaches from it and asked for a z pack to called into [facility] / please advise   ?  ? ?Reason for Disposition ? [1] Using nasal washes and pain medicine > 24 hours AND [2] sinus pain (around cheekbone or eye) persists ? ?Answer Assessment - Initial Assessment Questions ?1. LOCATION: "Where does it hurt?"  ?    Headache  ?2. ONSET: "When did the sinus pain start?"  (e.g., hours, days)  ?    1 week  ?3. SEVERITY: "How bad is the pain?"   (Scale 1-10; mild, moderate or severe) ?  - MILD (1-3): doesn't interfere with normal activities  ?  - MODERATE (4-7): interferes with normal activities (e.g., work or school) or awakens from sleep ?  - SEVERE (8-10): excruciating pain and patient unable to do any normal activities    ?    moderate ?4. RECURRENT SYMPTOM: "Have you ever had sinus problems before?" If Yes, ask: "When was the last time?" and "What happened that time?"  ?    Yes every year ?5. NASAL CONGESTION: "Is the nose blocked?" If Yes, ask: "Can you open it or must you breathe through your mouth?" ?    yes ?6. NASAL DISCHARGE: "Do you have discharge from your nose?" If so ask, "What color?" ?    Yes thick yellow ?7. FEVER: "Do you have a fever?" If Yes, ask: "What is it, how was it measured, and when did it start?"  ?    no ?8. OTHER SYMPTOMS: "Do you have any other symptoms?"  (e.g., sore throat, cough, earache, difficulty breathing) ?    no ? ?Protocols used: Sinus Pain or Congestion-A-AH ? ?

## 2021-07-27 NOTE — Telephone Encounter (Signed)
appt

## 2021-07-30 ENCOUNTER — Ambulatory Visit (INDEPENDENT_AMBULATORY_CARE_PROVIDER_SITE_OTHER): Payer: Medicare Other | Admitting: Family Medicine

## 2021-07-30 ENCOUNTER — Encounter: Payer: Self-pay | Admitting: Family Medicine

## 2021-07-30 VITALS — BP 115/72 | HR 61 | Temp 97.7°F | Wt 216.4 lb

## 2021-07-30 DIAGNOSIS — G44029 Chronic cluster headache, not intractable: Secondary | ICD-10-CM

## 2021-07-30 DIAGNOSIS — G8929 Other chronic pain: Secondary | ICD-10-CM

## 2021-07-30 DIAGNOSIS — I10 Essential (primary) hypertension: Secondary | ICD-10-CM | POA: Diagnosis not present

## 2021-07-30 DIAGNOSIS — R600 Localized edema: Secondary | ICD-10-CM

## 2021-07-30 DIAGNOSIS — R609 Edema, unspecified: Secondary | ICD-10-CM

## 2021-07-30 DIAGNOSIS — M25562 Pain in left knee: Secondary | ICD-10-CM

## 2021-07-30 MED ORDER — VERAPAMIL HCL ER 120 MG PO TBCR: 120.0000 mg | EXTENDED_RELEASE_TABLET | Freq: Every day | ORAL | 2 refills | Status: DC

## 2021-07-30 NOTE — Progress Notes (Signed)
? ?BP 115/72   Pulse 61   Temp 97.7 ?F (36.5 ?C)   Wt 216 lb 6.4 oz (98.2 kg)   SpO2 98%   BMI 32.90 kg/m?   ? ?Subjective:  ? ? Patient ID: Arthur Richardson, male    DOB: Nov 12, 1950, 71 y.o.   MRN: [id number] ? ?HPI: ?Arthur Richardson is a 71 y.o. male ? ?Chief Complaint  ?Patient presents with  ? Edema  ?  Patient states his urologist told him to ask PCP about his amlodipine causing his swelling. Patient states his left foot is worse than the left.  ? Sinusitis  ?  Patient states he has facial pressure for a few days, and nasal congestion   ? ?Has been swelling in his feet for months. Not getting better. He has seen cardiology and they were not concerned about his heart- wanted him to continue his lasix. He saw his urologist who was concerned that the amlodipine was contributing and recommended changing it. He has not had any redness or heat. No pain.  ? ?Arthur Richardson also notices that he has been having headaches for years that seem to come and go. They are worse in the spring and don't seem to happen every year. He notes that many years ago he had imaging done for them which was normal. He notes that in the last few weeks they have been acting up again. He has been getting headaches at the same time every day about 3PM. Happening almost every day. They are behind his L eye and make his eye water. He is otherwise feeling well with no fevers, chills, stuffy or runny nose. He feels well otherwise. No other concerns or complaints at this time.  ? ?Relevant past medical, surgical, family and social history reviewed and updated as indicated. Interim medical history since our last visit reviewed. ?Allergies and medications reviewed and updated. ? ?Review of Systems  ?Constitutional: Negative.   ?Respiratory: Negative.    ?Cardiovascular: Negative.   ?Gastrointestinal: Negative.   ?Neurological:  Positive for headaches. Negative for dizziness, tremors, seizures, syncope, facial asymmetry, speech difficulty,  weakness, light-headedness and numbness.  ?Psychiatric/Behavioral: Negative.    ? ?Per HPI unless specifically indicated above ? ?   ?Objective:  ?  ?BP 115/72   Pulse 61   Temp 97.7 ?F (36.5 ?C)   Wt 216 lb 6.4 oz (98.2 kg)   SpO2 98%   BMI 32.90 kg/m?   ?Wt Readings from Last 3 Encounters:  ?07/30/21 216 lb 6.4 oz (98.2 kg)  ?07/26/21 219 lb (99.3 kg)  ?06/14/21 219 lb (99.3 kg)  ?  ?Physical Exam ?Vitals and nursing note reviewed.  ?Constitutional:   ?   General: He is not in acute distress. ?   Appearance: Normal appearance. He is not ill-appearing, toxic-appearing or diaphoretic.  ?HENT:  ?   Head: Normocephalic and atraumatic.  ?   Right Ear: External ear normal.  ?   Left Ear: External ear normal.  ?   Nose: Nose normal.  ?   Mouth/Throat:  ?   Mouth: Mucous membranes are moist.  ?   Pharynx: Oropharynx is clear.  ?Eyes:  ?   General: No scleral icterus.    ?   Right eye: No discharge.     ?   Left eye: No discharge.  ?   Extraocular Movements: Extraocular movements intact.  ?   Conjunctiva/sclera: Conjunctivae normal.  ?   Pupils: Pupils are equal, round, and reactive to light.  ?  Cardiovascular:  ?   Rate and Rhythm: Normal rate and regular rhythm.  ?   Pulses: Normal pulses.  ?   Heart sounds: Normal heart sounds. No murmur heard. ?  No friction rub. No gallop.  ?Pulmonary:  ?   Effort: Pulmonary effort is normal. No respiratory distress.  ?   Breath sounds: Normal breath sounds. No stridor. No wheezing, rhonchi or rales.  ?Chest:  ?   Chest wall: No tenderness.  ?Musculoskeletal:     ?   General: Normal range of motion.  ?   Cervical back: Normal range of motion and neck supple.  ?   Right lower leg: Edema (1+) present.  ?   Left lower leg: Edema (1+) present.  ?Skin: ?   General: Skin is warm and dry.  ?   Capillary Refill: Capillary refill takes less than 2 seconds.  ?   Coloration: Skin is not jaundiced or pale.  ?   Findings: No bruising, erythema, lesion or rash.  ?Neurological:  ?   General: No  focal deficit present.  ?   Mental Status: He is alert and oriented to person, place, and time. Mental status is at baseline.  ?Psychiatric:     ?   Mood and Affect: Mood normal.     ?   Behavior: Behavior normal.     ?   Thought Content: Thought content normal.     ?   Judgment: Judgment normal.  ? ? ?Results for orders placed or performed in visit on 07/26/21  ?BLADDER SCAN AMB NON-IMAGING  ?Result Value Ref Range  ? Scan Result 35m   ? ?   ?Assessment & Plan:  ? ?Problem List Items Addressed This Visit   ? ?  ? Cardiovascular and Mediastinum  ? Hypertension  ?  Stable. Doing well on current regimen, but having edema- doubt that it is entirely from amlodipine, but we will stop amlodipine and change to verapamil. Continue lasix for now. Recheck 2 weeks.  ?  ?  ? Relevant Medications  ? verapamil (CALAN-SR) 120 MG CR tablet  ?  ? Nervous and Auditory  ? Chronic cluster headache, not intractable - Primary  ?  Will start him on verapamil for prevention. Call with any concerns. Recheck 2 weeks.  ?  ?  ? Relevant Medications  ? verapamil (CALAN-SR) 120 MG CR tablet  ?  ? Other  ? Peripheral edema  ?  Will stop his amlodipine and start him on verapamil. Recheck 2 weeks. Continue lasix. Call with any concerns.  ?  ?  ? ?Other Visit Diagnoses   ? ? Chronic pain of left knee      ? Referral to ortho made today.  ? Relevant Orders  ? Ambulatory referral to Orthopedic Surgery  ? ?  ?  ? ?Follow up plan: ?Return in about 2 weeks (around 08/13/2021). ? ? ? ? ? ?

## 2021-07-30 NOTE — Telephone Encounter (Signed)
Pt scheduled today

## 2021-07-30 NOTE — Assessment & Plan Note (Signed)
Stable. Doing well on current regimen, but having edema- doubt that it is entirely from amlodipine, but we will stop amlodipine and change to verapamil. Continue lasix for now. Recheck 2 weeks.  ?

## 2021-07-30 NOTE — Assessment & Plan Note (Signed)
Will stop his amlodipine and start him on verapamil. Recheck 2 weeks. Continue lasix. Call with any concerns.  ?

## 2021-07-30 NOTE — Assessment & Plan Note (Signed)
Will start him on verapamil for prevention. Call with any concerns. Recheck 2 weeks.  ?

## 2021-08-06 DIAGNOSIS — R2240 Localized swelling, mass and lump, unspecified lower limb: Secondary | ICD-10-CM | POA: Diagnosis not present

## 2021-08-06 DIAGNOSIS — M1612 Unilateral primary osteoarthritis, left hip: Secondary | ICD-10-CM | POA: Diagnosis not present

## 2021-08-09 ENCOUNTER — Other Ambulatory Visit: Payer: Self-pay

## 2021-08-09 DIAGNOSIS — E7800 Pure hypercholesterolemia, unspecified: Secondary | ICD-10-CM

## 2021-08-13 ENCOUNTER — Encounter: Payer: Self-pay | Admitting: Family Medicine

## 2021-08-13 ENCOUNTER — Ambulatory Visit (INDEPENDENT_AMBULATORY_CARE_PROVIDER_SITE_OTHER): Payer: Medicare Other | Admitting: Family Medicine

## 2021-08-13 VITALS — BP 134/81 | HR 57 | Wt 213.2 lb

## 2021-08-13 DIAGNOSIS — R609 Edema, unspecified: Secondary | ICD-10-CM | POA: Diagnosis not present

## 2021-08-13 DIAGNOSIS — G4486 Cervicogenic headache: Secondary | ICD-10-CM

## 2021-08-13 DIAGNOSIS — G44029 Chronic cluster headache, not intractable: Secondary | ICD-10-CM

## 2021-08-13 DIAGNOSIS — I10 Essential (primary) hypertension: Secondary | ICD-10-CM | POA: Diagnosis not present

## 2021-08-13 MED ORDER — VERAPAMIL HCL ER 120 MG PO TBCR
120.0000 mg | EXTENDED_RELEASE_TABLET | Freq: Every day | ORAL | 1 refills | Status: DC
Start: 2021-08-13 — End: 2021-12-10

## 2021-08-13 NOTE — Assessment & Plan Note (Signed)
Under good control on current regimen. Continue current regimen. Continue to monitor. Call with any concerns. Refills given. Labs with cardiology tomorrow.  ? ?

## 2021-08-13 NOTE — Assessment & Plan Note (Signed)
Significantly better on the verapamil. Continue current regimen. Continue to monitor. Call with any concerns.  ?

## 2021-08-13 NOTE — Assessment & Plan Note (Signed)
Stable. Unable to tolerate compression hose. Will get him into vascular surgery. Referral generated today. Encouraged compression hose. Continue lasix.  ?

## 2021-08-13 NOTE — Progress Notes (Signed)
? ?BP 134/81   Pulse (!) 57   Wt 213 lb 3.2 oz (96.7 kg)   SpO2 99%   BMI 32.42 kg/m?   ? ?Subjective:  ? ? Patient ID: Arthur Richardson, male    DOB: Dec 01, 1950, 71 y.o.   MRN: [id number] ? ?HPI: ?Arthur Richardson is a 71 y.o. male ? ?Chief Complaint  ?Patient presents with  ? Hypertension  ? Edema  ?  Patient states he is still having swelling, has not decreased.   ? Headache  ?  Patient states he is still having headaches. Patient states he is having pain in his left shoulder and after he ices it, the headache is controlled.   ? ?Headaches at 3PM are better. Continues with headaches at different times. ? ?HYPERTENSION ?Hypertension status: controlled  ?Satisfied with current treatment? yes ?Duration of hypertension: chronic ?BP monitoring frequency:  not checking ?BP medication side effects:  no ?Medication compliance: excellent compliance ?Previous BP meds: verapamil, losartan, lasix ?Aspirin: yes ?Recurrent headaches: yes ?Visual changes: no ?Palpitations: no ?Dyspnea: no ?Chest pain: no ?Lower extremity edema: yes ?Dizzy/lightheaded: no ? ?He continues with swelling in his legs. No significant change with stopping the amlodipine. Better in the AM but worse in the PM. No redness, no heat. He notes that he tired to get the compression hose on, but had a lot of issues getting them on and still had swelling in the PM, but notes that it may have been better than usual. He's frustrated with the swelling.  ? ?He has been having some headaches. He notes that the pain at [facility] has resolved and he is no longer having that particular headache. He has been having headaches at random. It seems to be worse when he pushes on the muscles on his L shoulder. He has known arthritis and tendonitis in his L shoulder and has had shots in it before. He also note that this has happened with the headaches before and he had good results with seeing a massage therapist. He is otherwise feeling well with no other concerns or  complaints at this time.  ? ?Relevant past medical, surgical, family and social history reviewed and updated as indicated. Interim medical history since our last visit reviewed. ?Allergies and medications reviewed and updated. ? ?Review of Systems  ?Constitutional: Negative.   ?Respiratory: Negative.    ?Cardiovascular:  Positive for leg swelling. Negative for chest pain and palpitations.  ?Gastrointestinal: Negative.   ?Musculoskeletal:  Positive for arthralgias, myalgias and neck pain. Negative for back pain, gait problem, joint swelling and neck stiffness.  ?Neurological:  Positive for headaches. Negative for dizziness, tremors, seizures, syncope, facial asymmetry, speech difficulty, weakness, light-headedness and numbness.  ?Psychiatric/Behavioral: Negative.    ? ?Per HPI unless specifically indicated above ? ?   ?Objective:  ?  ?BP 134/81   Pulse (!) 57   Wt 213 lb 3.2 oz (96.7 kg)   SpO2 99%   BMI 32.42 kg/m?   ?Wt Readings from Last 3 Encounters:  ?08/13/21 213 lb 3.2 oz (96.7 kg)  ?07/30/21 216 lb 6.4 oz (98.2 kg)  ?07/26/21 219 lb (99.3 kg)  ?  ?Physical Exam ?Vitals and nursing note reviewed.  ?Constitutional:   ?   General: He is not in acute distress. ?   Appearance: Normal appearance. He is not ill-appearing, toxic-appearing or diaphoretic.  ?HENT:  ?   Head: Normocephalic and atraumatic.  ?   Right Ear: External ear normal.  ?   Left Ear:  External ear normal.  ?   Nose: Nose normal.  ?   Mouth/Throat:  ?   Mouth: Mucous membranes are moist.  ?   Pharynx: Oropharynx is clear.  ?Eyes:  ?   General: No scleral icterus.    ?   Right eye: No discharge.     ?   Left eye: No discharge.  ?   Extraocular Movements: Extraocular movements intact.  ?   Conjunctiva/sclera: Conjunctivae normal.  ?   Pupils: Pupils are equal, round, and reactive to light.  ?Cardiovascular:  ?   Rate and Rhythm: Normal rate and regular rhythm.  ?   Pulses: Normal pulses.  ?   Heart sounds: Normal heart sounds. No murmur heard. ?   No friction rub. No gallop.  ?Pulmonary:  ?   Effort: Pulmonary effort is normal. No respiratory distress.  ?   Breath sounds: Normal breath sounds. No stridor. No wheezing, rhonchi or rales.  ?Chest:  ?   Chest wall: No tenderness.  ?Musculoskeletal:     ?   General: Normal range of motion.  ?   Cervical back: Normal range of motion and neck supple.  ?   Comments: 1+ edema bilaterally  ?Skin: ?   General: Skin is warm and dry.  ?   Capillary Refill: Capillary refill takes less than 2 seconds.  ?   Coloration: Skin is not jaundiced or pale.  ?   Findings: No bruising, erythema, lesion or rash.  ?Neurological:  ?   General: No focal deficit present.  ?   Mental Status: He is alert and oriented to person, place, and time. Mental status is at baseline.  ?Psychiatric:     ?   Mood and Affect: Mood normal.     ?   Behavior: Behavior normal.     ?   Thought Content: Thought content normal.     ?   Judgment: Judgment normal.  ? ? ?Results for orders placed or performed in visit on 07/26/21  ?BLADDER SCAN AMB NON-IMAGING  ?Result Value Ref Range  ? Scan Result 81m   ? ?   ?Assessment & Plan:  ? ?Problem List Items Addressed This Visit   ? ?  ? Cardiovascular and Mediastinum  ? Hypertension - Primary  ?  Under good control on current regimen. Continue current regimen. Continue to monitor. Call with any concerns. Refills given. Labs with cardiology tomorrow.  ? ? ?  ?  ? Relevant Medications  ? verapamil (CALAN-SR) 120 MG CR tablet  ?  ? Nervous and Auditory  ? Chronic cluster headache, not intractable  ?  Significantly better on the verapamil. Continue current regimen. Continue to monitor. Call with any concerns.  ? ?  ?  ? Relevant Medications  ? verapamil (CALAN-SR) 120 MG CR tablet  ?  ? Other  ? Peripheral edema  ?  Stable. Unable to tolerate compression hose. Will get him into vascular surgery. Referral generated today. Encouraged compression hose. Continue lasix.  ? ?  ?  ? Relevant Orders  ? Ambulatory referral to  Vascular Surgery  ? ?Other Visit Diagnoses   ? ? Cervicogenic headache      ? Will get into see PM&R for ? shoulder injection and to see his massage therapist. If not improving, he will let me know.   ? Relevant Medications  ? verapamil (CALAN-SR) 120 MG CR tablet  ? ?  ?  ? ?Follow up plan: ?Return in about 6 months (  around 02/12/2022). ? ? ? ? ? ?

## 2021-08-14 ENCOUNTER — Other Ambulatory Visit (INDEPENDENT_AMBULATORY_CARE_PROVIDER_SITE_OTHER): Payer: Medicare Other

## 2021-08-14 DIAGNOSIS — E7800 Pure hypercholesterolemia, unspecified: Secondary | ICD-10-CM

## 2021-08-15 DIAGNOSIS — G8929 Other chronic pain: Secondary | ICD-10-CM | POA: Diagnosis not present

## 2021-08-15 DIAGNOSIS — M7542 Impingement syndrome of left shoulder: Secondary | ICD-10-CM | POA: Diagnosis not present

## 2021-08-15 DIAGNOSIS — M25512 Pain in left shoulder: Secondary | ICD-10-CM | POA: Diagnosis not present

## 2021-08-15 LAB — LIPID PANEL
Chol/HDL Ratio: 2.7 ratio (ref 0.0–5.0)
Cholesterol, Total: 149 mg/dL (ref 100–199)
HDL: 56 mg/dL (ref >39)
LDL Chol Calc (NIH): 79 mg/dL (ref 0–99)
Triglycerides: 73 mg/dL (ref 0–149)
VLDL Cholesterol Cal: 14 mg/dL (ref 5–40)

## 2021-08-20 ENCOUNTER — Telehealth: Payer: Self-pay | Admitting: Cardiology

## 2021-08-20 NOTE — Telephone Encounter (Signed)
Spoke with patient's spouse, Arthur Richardson (on [place]). ? ?She states patient was calling for test results of recent Lipid panel. ? ?Per Dr. Marion Richardson: ? Arthur Sable, MD  ?08/15/2021  6:03 PM EDT   ?  ?Cholesterol controlled, continue medications as prescribed.  ? ?Arthur Richardson verbalized understanding and expressed appreciation for return call. ?

## 2021-08-20 NOTE — Telephone Encounter (Signed)
Pt calling to f/u on test results. Please advise. 

## 2021-08-23 ENCOUNTER — Other Ambulatory Visit: Payer: Self-pay

## 2021-08-23 MED ORDER — FUROSEMIDE 40 MG PO TABS: 40.0000 mg | ORAL_TABLET | Freq: Every day | ORAL | 0 refills | Status: DC | PRN

## 2021-08-29 DIAGNOSIS — M25552 Pain in left hip: Secondary | ICD-10-CM | POA: Diagnosis not present

## 2021-09-20 ENCOUNTER — Other Ambulatory Visit: Payer: Self-pay | Admitting: Family Medicine

## 2021-09-21 NOTE — Telephone Encounter (Signed)
Rx 05/24/21 #90 1RF Requested Prescriptions  Pending Prescriptions Disp Refills  . dutasteride (AVODART) 0.5 MG capsule [Pharmacy Med Name: Dutasteride 0.5 MG Oral Capsule] 90 capsule 3    Sig: TAKE 1 CAPSULE BY MOUTH DAILY     Urology: 5-alpha Reductase Inhibitors Passed - 09/20/2021  5:43 AM      Passed - PSA in normal range and within 360 days    Prostate Specific Ag, Serum  Date Value Ref Range Status  05/24/2021 2.5 0.0 - 4.0 ng/mL Final    Comment:    Roche ECLIA methodology. According to the American Urological Association, Serum PSA should decrease and remain at undetectable levels after radical prostatectomy. The AUA defines biochemical recurrence as an initial PSA value 0.2 ng/mL or greater followed by a subsequent confirmatory PSA value 0.2 ng/mL or greater. Values obtained with different assay methods or kits cannot be used interchangeably. Results cannot be interpreted as absolute evidence of the presence or absence of malignant disease.          Passed - Valid encounter within last 12 months    Recent Outpatient Visits          1 month ago Primary hypertension   [place], Megan P, DO   1 month ago Chronic cluster headache, not intractable   [place], Megan P, DO   4 months ago Routine general medical examination at a health care facility   [facility], [place] P, DO   10 months ago Primary hypertension   [place], [place], DO   11 months ago Primary hypertension   [place], [place], DO      Future Appointments            In 5 months Johnson, Barb Merino, DO [employer] [facility], [place]   In 10 months Diamantina [place], Herbert Seta, MD [facility]           . atorvastatin (LIPITOR) 40 MG tablet [Pharmacy Med Name: Atorvastatin Calcium 40 MG Oral Tablet] 90 tablet 3    Sig: TAKE 1 TABLET BY MOUTH DAILY     Cardiovascular:  Antilipid -  Statins Failed - 09/20/2021  5:43 AM      Failed - Lipid Panel in normal range within the last 12 months    Cholesterol, Total  Date Value Ref Range Status  08/14/2021 149 100 - 199 mg/dL Final   LDL Chol Calc (NIH)  Date Value Ref Range Status  08/14/2021 79 0 - 99 mg/dL Final   HDL  Date Value Ref Range Status  08/14/2021 56 >39 mg/dL Final   Triglycerides  Date Value Ref Range Status  08/14/2021 73 0 - 149 mg/dL Final         Passed - Patient is not pregnant      Passed - Valid encounter within last 12 months    Recent Outpatient Visits          1 month ago Primary hypertension   [place], Megan P, DO   1 month ago Chronic cluster headache, not intractable   [place], Megan P, DO   4 months ago Routine general medical examination at a health care facility   [facility], [place] P, DO   10 months ago Primary hypertension   [place], [place], DO   11 months ago Primary hypertension   [facility] [place], Megan  P, DO      Future Appointments            In 5 months Johnson, Barb Merino, DO [employer], [place]   In 10 months [place], Herbert Seta, MD [facility]

## 2021-09-21 NOTE — Telephone Encounter (Signed)
Requested medication (s) are due for refill today - no  Requested medication (s) are on the active medication list -no  Future visit scheduled -yes  Last refill: medication discontinued 07/26/21  Notes to clinic: Request RF- medication no longer on current list  Requested Prescriptions  Pending Prescriptions Disp Refills   dutasteride (AVODART) 0.5 MG capsule [Pharmacy Med Name: Dutasteride 0.5 MG Oral Capsule] 90 capsule 3    Sig: TAKE 1 CAPSULE BY MOUTH DAILY     Urology: 5-alpha Reductase Inhibitors Passed - 09/20/2021  5:43 AM      Passed - PSA in normal range and within 360 days    Prostate Specific Ag, Serum  Date Value Ref Range Status  05/24/2021 2.5 0.0 - 4.0 ng/mL Final    Comment:    Roche ECLIA methodology. According to the American Urological Association, Serum PSA should decrease and remain at undetectable levels after radical prostatectomy. The AUA defines biochemical recurrence as an initial PSA value 0.2 ng/mL or greater followed by a subsequent confirmatory PSA value 0.2 ng/mL or greater. Values obtained with different assay methods or kits cannot be used interchangeably. Results cannot be interpreted as absolute evidence of the presence or absence of malignant disease.          Passed - Valid encounter within last 12 months    Recent Outpatient Visits           1 month ago Primary hypertension   [place], Megan P, DO   1 month ago Chronic cluster headache, not intractable   [place], Megan P, DO   4 months ago Routine general medical examination at a health care facility   [facility], [place] P, DO   10 months ago Primary hypertension   [place], [place], DO   11 months ago Primary hypertension   [facility] [place], [place], DO       Future Appointments             In 5 months Johnson, Megan P, DO [facility], [facility]   In 10 months  [place], Herbert Seta, MD [facility]             Refused Prescriptions Disp Refills   atorvastatin (LIPITOR) 40 MG tablet [Pharmacy Med Name: Atorvastatin Calcium 40 MG Oral Tablet] 90 tablet 3    Sig: TAKE 1 TABLET BY MOUTH DAILY     Cardiovascular:  Antilipid - Statins Failed - 09/20/2021  5:43 AM      Failed - Lipid Panel in normal range within the last 12 months    Cholesterol, Total  Date Value Ref Range Status  08/14/2021 149 100 - 199 mg/dL Final   LDL Chol Calc (NIH)  Date Value Ref Range Status  08/14/2021 79 0 - 99 mg/dL Final   HDL  Date Value Ref Range Status  08/14/2021 56 >39 mg/dL Final   Triglycerides  Date Value Ref Range Status  08/14/2021 73 0 - 149 mg/dL Final         Passed - Patient is not pregnant      Passed - Valid encounter within last 12 months    Recent Outpatient Visits           1 month ago Primary hypertension   [place], Megan P, DO   1 month ago Chronic cluster headache, not intractable   [place], [place], DO  4 months ago Routine general medical examination at a health care facility   [facility], [place], DO   10 months ago Primary hypertension   [facility] [place], [place], DO   11 months ago Primary hypertension   [facility] [place], [place], DO       Future Appointments             In 5 months Wyn[place], Barb Merino, DO [facility]   In 46 months Diamantina [place], Herbert Seta, MD [place]                Requested Prescriptions  Pending Prescriptions Disp Refills   dutasteride (AVODART) 0.5 MG capsule [Pharmacy Med Name: Dutasteride 0.5 MG Oral Capsule] 90 capsule 3    Sig: TAKE 1 CAPSULE BY MOUTH DAILY     Urology: 5-alpha Reductase Inhibitors Passed - 09/20/2021  5:43 AM      Passed - PSA in normal range and within 360 days    Prostate Specific Ag, Serum  Date Value  Ref Range Status  05/24/2021 2.5 0.0 - 4.0 ng/mL Final    Comment:    Roche ECLIA methodology. According to the American Urological Association, Serum PSA should decrease and remain at undetectable levels after radical prostatectomy. The AUA defines biochemical recurrence as an initial PSA value 0.2 ng/mL or greater followed by a subsequent confirmatory PSA value 0.2 ng/mL or greater. Values obtained with different assay methods or kits cannot be used interchangeably. Results cannot be interpreted as absolute evidence of the presence or absence of malignant disease.          Passed - Valid encounter within last 12 months    Recent Outpatient Visits           1 month ago Primary hypertension   [place], Megan P, DO   1 month ago Chronic cluster headache, not intractable   [place], Megan P, DO   4 months ago Routine general medical examination at a health care facility   [facility], [place] P, DO   10 months ago Primary hypertension   [place], [place], DO   11 months ago Primary hypertension   [facility] [place], [place], DO       Future Appointments             In 5 months Johnson, Megan P, DO [facility]   In 10 months [place], Herbert Seta, MD [facility]             Refused Prescriptions Disp Refills   atorvastatin (LIPITOR) 40 MG tablet [Pharmacy Med Name: Atorvastatin Calcium 40 MG Oral Tablet] 90 tablet 3    Sig: TAKE 1 TABLET BY MOUTH DAILY     Cardiovascular:  Antilipid - Statins Failed - 09/20/2021  5:43 AM      Failed - Lipid Panel in normal range within the last 12 months    Cholesterol, Total  Date Value Ref Range Status  08/14/2021 149 100 - 199 mg/dL Final   LDL Chol Calc (NIH)  Date Value Ref Range Status  08/14/2021 79 0 - 99 mg/dL Final   HDL  Date Value Ref Range Status  08/14/2021 56 >39 mg/dL Final    Triglycerides  Date Value Ref Range Status  08/14/2021 73 0 - 149 mg/dL Final         Passed -  Patient is not pregnant      Passed - Valid encounter within last 12 months    Recent Outpatient Visits           1 month ago Primary hypertension   [place], Megan P, DO   1 month ago Chronic cluster headache, not intractable   [place], Megan P, DO   4 months ago Routine general medical examination at a health care facility   [facility], [place] P, DO   10 months ago Primary hypertension   [place], [place], DO   11 months ago Primary hypertension   [place], Barb Merino, DO       Future Appointments             In 5 months Johnson, Barb Merino, DO [facility] [facility], [place]   In 10 months Diamantina Providence, Herbert Seta, MD [place]

## 2021-10-15 ENCOUNTER — Other Ambulatory Visit: Payer: Self-pay | Admitting: Family Medicine

## 2021-10-15 DIAGNOSIS — I10 Essential (primary) hypertension: Secondary | ICD-10-CM

## 2021-10-16 ENCOUNTER — Other Ambulatory Visit: Payer: Self-pay | Admitting: Family Medicine

## 2021-10-16 DIAGNOSIS — K209 Esophagitis, unspecified without bleeding: Secondary | ICD-10-CM

## 2021-10-16 NOTE — Telephone Encounter (Signed)
Requested Prescriptions  Pending Prescriptions Disp Refills  . omeprazole (PRILOSEC) 40 MG capsule [Pharmacy Med Name: Omeprazole 40 MG Oral Capsule Delayed Release] 90 capsule 1    Sig: TAKE 1 CAPSULE BY MOUTH DAILY     Gastroenterology: Proton Pump Inhibitors Passed - 10/16/2021  9:41 AM      Passed - Valid encounter within last 12 months    Recent Outpatient Visits          2 months ago Primary hypertension   [place], Megan P, DO   2 months ago Chronic cluster headache, not intractable   [place], Megan P, DO   4 months ago Routine general medical examination at a health care facility   [facility], [place], DO   10 months ago Primary hypertension   [place], [place], DO   11 months ago Primary hypertension   [place], [place], DO      Future Appointments            In 4 months Johnson, Barb Merino, DO [facility] [facility], [place]   In 9 months Diamantina Providence, Herbert Seta, MD [place]

## 2021-10-16 NOTE — Telephone Encounter (Signed)
Requested Prescriptions  Pending Prescriptions Disp Refills  . losartan (COZAAR) 100 MG tablet [Pharmacy Med Name: Losartan Potassium 100 MG Oral Tablet] 90 tablet 1    Sig: TAKE 1 TABLET BY MOUTH DAILY     Cardiovascular:  Angiotensin Receptor Blockers Passed - 10/15/2021 11:24 PM      Passed - Cr in normal range and within 180 days    Creatinine, Ser  Date Value Ref Range Status  05/24/2021 1.05 0.76 - 1.27 mg/dL Final         Passed - K in normal range and within 180 days    Potassium  Date Value Ref Range Status  05/24/2021 4.6 3.5 - 5.2 mmol/L Final         Passed - Patient is not pregnant      Passed - Last BP in normal range    BP Readings from Last 1 Encounters:  08/13/21 134/81         Passed - Valid encounter within last 6 months    Recent Outpatient Visits          2 months ago Primary hypertension   [place], Megan P, DO   2 months ago Chronic cluster headache, not intractable   [place], Megan P, DO   4 months ago Routine general medical examination at a health care facility   [facility], [place], DO   10 months ago Primary hypertension   [place], [place], DO   11 months ago Primary hypertension   [place], [place], DO      Future Appointments            In 4 months Johnson, Barb Merino, DO [employer] [facility], [place]   In 9 months Diamantina Providence, Herbert Seta, MD [place]

## 2021-10-17 ENCOUNTER — Other Ambulatory Visit: Payer: Self-pay

## 2021-10-17 MED ORDER — FUROSEMIDE 40 MG PO TABS: 40.0000 mg | ORAL_TABLET | Freq: Every day | ORAL | 3 refills | Status: DC | PRN

## 2021-10-31 ENCOUNTER — Ambulatory Visit (INDEPENDENT_AMBULATORY_CARE_PROVIDER_SITE_OTHER): Payer: Medicare Other | Admitting: Nurse Practitioner

## 2021-10-31 ENCOUNTER — Encounter: Payer: Self-pay | Admitting: Nurse Practitioner

## 2021-10-31 VITALS — BP 112/71 | HR 80 | Temp 97.4°F | Ht 68.0 in | Wt 203.6 lb

## 2021-10-31 DIAGNOSIS — R399 Unspecified symptoms and signs involving the genitourinary system: Secondary | ICD-10-CM | POA: Diagnosis not present

## 2021-10-31 DIAGNOSIS — R8281 Pyuria: Secondary | ICD-10-CM

## 2021-10-31 DIAGNOSIS — N401 Enlarged prostate with lower urinary tract symptoms: Secondary | ICD-10-CM | POA: Diagnosis not present

## 2021-10-31 DIAGNOSIS — N138 Other obstructive and reflux uropathy: Secondary | ICD-10-CM

## 2021-10-31 LAB — URINALYSIS, ROUTINE W REFLEX MICROSCOPIC
Bilirubin, UA: NEGATIVE
Glucose, UA: NEGATIVE
Ketones, UA: NEGATIVE
Nitrite, UA: POSITIVE — AB
Specific Gravity, UA: 1.015 (ref 1.005–1.030)
Urobilinogen, Ur: >8 mg/dL — ABNORMAL HIGH (ref 0.2–1.0)
pH, UA: 6.5 (ref 5.0–7.5)

## 2021-10-31 LAB — MICROSCOPIC EXAMINATION

## 2021-10-31 MED ORDER — CIPROFLOXACIN HCL 500 MG PO TABS: 500.0000 mg | ORAL_TABLET | Freq: Two times a day (BID) | ORAL | 0 refills | Status: AC

## 2021-10-31 NOTE — Patient Instructions (Signed)
Urinary Tract Infection, Adult A urinary tract infection (UTI) is an infection of any part of the urinary tract. The urinary tract includes: The kidneys. The ureters. The bladder. The urethra. These organs make, store, and get rid of pee (urine) in the body. What are the causes? This infection is caused by germs (bacteria) in your genital area. These germs grow and cause swelling (inflammation) of your urinary tract. What increases the risk? The following factors may make you more likely to develop this condition: Using a small, thin tube (catheter) to drain pee. Not being able to control when you pee or poop (incontinence). Being male. If you are male, these things can increase the risk: Using these methods to prevent pregnancy: A medicine that kills sperm (spermicide). A device that blocks sperm (diaphragm). Having low levels of a male hormone (estrogen). Being pregnant. You are more likely to develop this condition if: You have genes that add to your risk. You are sexually active. You take antibiotic medicines. You have trouble peeing because of: A prostate that is bigger than normal, if you are male. A blockage in the part of your body that drains pee from the bladder. A kidney stone. A nerve condition that affects your bladder. Not getting enough to drink. Not peeing often enough. You have other conditions, such as: Diabetes. A weak disease-fighting system (immune system). Sickle cell disease. Gout. Injury of the spine. What are the signs or symptoms? Symptoms of this condition include: Needing to pee right away. Peeing small amounts often. Pain or burning when peeing. Blood in the pee. Pee that smells bad or not like normal. Trouble peeing. Pee that is cloudy. Fluid coming from the vagina, if you are male. Pain in the belly or lower back. Other symptoms include: Vomiting. Not feeling hungry. Feeling mixed up (confused). This may be the first symptom in  older adults. Being tired and grouchy (irritable). A fever. Watery poop (diarrhea). How is this treated? Taking antibiotic medicine. Taking other medicines. Drinking enough water. In some cases, you may need to see a specialist. Follow these instructions at home:  Medicines Take over-the-counter and prescription medicines only as told by your doctor. If you were prescribed an antibiotic medicine, take it as told by your doctor. Do not stop taking it even if you start to feel better. General instructions Make sure you: Pee until your bladder is empty. Do not hold pee for a long time. Empty your bladder after sex. Wipe from front to back after peeing or pooping if you are a male. Use each tissue one time when you wipe. Drink enough fluid to keep your pee pale yellow. Keep all follow-up visits. Contact a doctor if: You do not get better after 1-2 days. Your symptoms go away and then come back. Get help right away if: You have very bad back pain. You have very bad pain in your lower belly. You have a fever. You have chills. You feeling like you will vomit or you vomit. Summary A urinary tract infection (UTI) is an infection of any part of the urinary tract. This condition is caused by germs in your genital area. There are many risk factors for a UTI. Treatment includes antibiotic medicines. Drink enough fluid to keep your pee pale yellow. This information is not intended to replace advice given to you by your health care provider. Make sure you discuss any questions you have with your health care provider. Document Revised: 11/26/2019 Document Reviewed: 11/26/2019 Elsevier Patient Education    2023 Elsevier Inc.  

## 2021-10-31 NOTE — Progress Notes (Signed)
BP 112/71   Pulse 80   Temp (!) 97.4 F (36.3 C) (Oral)   Ht '5\' 8"'$  (1.727 m)   Wt 203 lb 9.6 oz (92.4 kg)   SpO2 99%   BMI 30.96 kg/m    Subjective:    Patient ID: Arthur Richardson, male    DOB: 1950-08-21, 71 y.o.   MRN: [id number]  HPI: Arthur Richardson is a 71 y.o. male  Chief Complaint  Patient presents with   Dysuria    Patient is here for burning with urination. Patient says he began having urinating symptoms starting Monday. Patient says he has been using the Men's Kotex pads to help with leakage and he says they continue to leak. Patient says he is also feeling weak and having a lot of back pain.    NOTE WRITTEN BY FNP STUDENT.  ASSESSMENT AND PLAN OF CARE REVIEWED WITH STUDENT, AGREE WITH ABOVE FINDINGS AND PLAN.   URINARY SYMPTOMS Presents today for dysuria + leakage of urine + weakness. Symptoms began on Monday. He has urgency, frequency, burning , and mid back pain. Urine is dark, amber in color at home.  Has been followed by urology with last visit for BPH on 06/29/21 - to continue Flomax and discontinued Dutasteride and Ditropan.  Was a recommendation to decrease Amlodipine if ongoing lower extremity edema and nocturia. Dysuria: burning Urinary frequency: yes Urgency: yes Small volume voids: yes Symptom severity: yes Urinary incontinence: yes leaking Foul odor: yes Hematuria: no Abdominal pain: yes Back pain: yes Suprapubic pain/pressure: yes Flank pain: no Fever:  subjective Vomiting: no Relief with cranberry juice:  not tried Relief with pyridium:  not tried Status: stable Previous urinary tract infection: no Recurrent urinary tract infection: no Sexual activity: Not currently History of sexually transmitted disease: no Penile discharge: no Treatments attempted: increasing fluids    Relevant past medical, surgical, family and social history reviewed and updated as indicated. Interim medical history since our last visit reviewed. Allergies and  medications reviewed and updated.  Review of Systems  Constitutional:  Positive for chills and fatigue. Negative for activity change, appetite change and fever.  HENT: Negative.    Respiratory:  Negative for cough, chest tightness and shortness of breath.   Cardiovascular:  Negative for chest pain, palpitations and leg swelling.  Gastrointestinal:  Negative for abdominal pain, nausea and vomiting.  Genitourinary:  Positive for decreased urine volume, difficulty urinating, dysuria and urgency. Negative for flank pain, hematuria and penile discharge.  Musculoskeletal:  Positive for back pain. Negative for arthralgias and myalgias.  Neurological:  Positive for weakness (at baseline). Negative for light-headedness, numbness and headaches.    Per HPI unless specifically indicated above     Objective:    BP 112/71   Pulse 80   Temp (!) 97.4 F (36.3 C) (Oral)   Ht '5\' 8"'$  (1.727 m)   Wt 203 lb 9.6 oz (92.4 kg)   SpO2 99%   BMI 30.96 kg/m   Wt Readings from Last 3 Encounters:  10/31/21 203 lb 9.6 oz (92.4 kg)  08/13/21 213 lb 3.2 oz (96.7 kg)  07/30/21 216 lb 6.4 oz (98.2 kg)    Physical Exam Vitals and nursing note reviewed.  Constitutional:      General: He is not in acute distress.    Appearance: Normal appearance. He is not ill-appearing.  HENT:     Head: Normocephalic.     Right Ear: Hearing normal.     Left Ear: Hearing normal.  Neck:     Thyroid: No thyromegaly or thyroid tenderness.  Cardiovascular:     Rate and Rhythm: Normal rate and regular rhythm.     Heart sounds: Normal heart sounds. No murmur heard. Pulmonary:     Effort: Pulmonary effort is normal. No respiratory distress.     Breath sounds: Normal breath sounds.  Abdominal:     General: Bowel sounds are normal.     Palpations: Abdomen is soft.     Tenderness: There is no right CVA tenderness or left CVA tenderness.  Musculoskeletal:     Cervical back: Normal range of motion.  Lymphadenopathy:     Head:      Right side of head: No submental, submandibular, tonsillar, preauricular or posterior auricular adenopathy.     Left side of head: No submental, submandibular, tonsillar, preauricular or posterior auricular adenopathy.  Skin:    General: Skin is warm.     Capillary Refill: Capillary refill takes less than 2 seconds.  Neurological:     General: No focal deficit present.     Mental Status: He is alert and oriented to person, place, and time.     Motor: Motor function is intact.     Coordination: Coordination is intact.     Gait: Gait is intact.  Psychiatric:        Attention and Perception: Attention normal.        Mood and Affect: Mood normal.        Speech: Speech normal.        Behavior: Behavior is cooperative.        Thought Content: Thought content normal.        Cognition and Memory: Cognition normal.        Judgment: Judgment normal.     Results for orders placed or performed in visit on 10/31/21  Microscopic Examination   Urine  Result Value Ref Range   WBC, UA 11-30 (A) 0 - 5 /hpf   RBC, Urine 3-10 (A) 0 - 2 /hpf   Epithelial Cells (non renal) 0-10 0 - 10 /hpf   Mucus, UA Present (A) Not Estab.   Bacteria, UA Many (A) None seen/Few  Urinalysis, Routine w reflex microscopic  Result Value Ref Range   Specific Gravity, UA 1.015 1.005 - 1.030   pH, UA 6.5 5.0 - 7.5   Color, UA Yellow Yellow   Appearance Ur Cloudy (A) Clear   Leukocytes,UA 3+ (A) Negative   Protein,UA 1+ (A) Negative/Trace   Glucose, UA Negative Negative   Ketones, UA Negative Negative   RBC, UA 2+ (A) Negative   Bilirubin, UA Negative Negative   Urobilinogen, Ur >8.0 (H) 0.2 - 1.0 mg/dL   Nitrite, UA Positive (A) Negative   Microscopic Examination See below:       Assessment & Plan:   Problem List Items Addressed This Visit       Genitourinary   BPH with obstruction/lower urinary tract symptoms    Not under good control. Continues to have symptoms and leakage, worse at night.  Complicated by acute UTI. Recommend to follow up with Urology      Relevant Medications   dutasteride (AVODART) 0.5 MG capsule   Other Visit Diagnoses     Urinary symptom or sign    -  Primary   Acute for 3 days. UA today shows Leuk 3+, Blood 2+, positive nitrites. Sent Cipro for 7 days. Follow up in 2 weeks to recheck urine.    Relevant Orders  Urinalysis, Routine w reflex microscopic (Completed)   Pyuria       Acute for 3 days. UA consistent with UTI, treat with Cipro x7 days   Relevant Orders   Urine Culture        Follow up plan: Return in about 2 weeks (around 11/14/2021) for UTI Follow-up.

## 2021-10-31 NOTE — Progress Notes (Deleted)
   Wt 203 lb 9.6 oz (92.4 kg)   BMI 30.96 kg/m    Subjective:    Patient ID: Arthur Richardson, male    DOB: [birth year], 71 y.o.   MRN: [id number]  HPI: Arthur Richardson is a 71 y.o. male  Chief Complaint  Patient presents with   Dysuria    Patient is here for burning with urination. Patient says he began having urinating symptoms starting Monday. Patient says he has been using the Men's Kotex pads to help with leakage and he says they continue to leak. Patient says he is also feeling weak and having a lot of back pain.    URINARY SYMPTOMS Presents today for dysuria + leakage of urine + weakness.   Has been followed by urology with last visit for BPH on 06/29/21 - to continue Flomax and discontinued Dutasteride and Ditropan.  Was a recommendation to decrease Amlodipine if ongoing lower extremity edema and nocturia. Dysuria: {Blank single:19197::"yes","no","burning"} Urinary frequency: {Blank single:19197::"yes","no"} Urgency: {Blank single:19197::"yes","no"} Small volume voids: {Blank single:19197::"yes","no"} Symptom severity: {Blank single:19197::"yes","no"} Urinary incontinence: {Blank single:19197::"yes","no"} Foul odor: {Blank single:19197::"yes","no"} Hematuria: {Blank single:19197::"yes","no"} Abdominal pain: {Blank single:19197::"yes","no"} Back pain: {Blank single:19197::"yes","no"} Suprapubic pain/pressure: {Blank single:19197::"yes","no"} Flank pain: {Blank single:19197::"yes","no"} Fever:  {Blank multiple:19196::"yes","no","subjective","low grade"} Vomiting: {Blank single:19197::"yes","no"} Relief with cranberry juice: {Blank single:19197::"yes","no"} Relief with pyridium: {Blank single:19197::"yes","no"} Status: better/worse/stable Previous urinary tract infection: {Blank single:19197::"yes","no"} Recurrent urinary tract infection: {Blank single:19197::"yes","no"} Sexual activity: No sexually active/monogomous/practicing safe sex History of sexually transmitted  disease: {Blank single:19197::"yes","no"} Penile discharge: {Blank single:19197::"yes","no"} Treatments attempted: {Blank multiple:19196::"none","antibiotics","pyridium","cranberry","increasing fluids"}    Relevant past medical, surgical, family and social history reviewed and updated as indicated. Interim medical history since our last visit reviewed. Allergies and medications reviewed and updated.  Review of Systems  Per HPI unless specifically indicated above     Objective:    Wt 203 lb 9.6 oz (92.4 kg)   BMI 30.96 kg/m   Wt Readings from Last 3 Encounters:  10/31/21 203 lb 9.6 oz (92.4 kg)  08/13/21 213 lb 3.2 oz (96.7 kg)  07/30/21 216 lb 6.4 oz (98.2 kg)    Physical Exam  Results for orders placed or performed in visit on 08/14/21  Lipid panel  Result Value Ref Range   Cholesterol, Total 149 100 - 199 mg/dL   Triglycerides 73 0 - 149 mg/dL   HDL 56 >39 mg/dL   VLDL Cholesterol Cal 14 5 - 40 mg/dL   LDL Chol Calc (NIH) 79 0 - 99 mg/dL   Chol/HDL Ratio 2.7 0.0 - 5.0 ratio      Assessment & Plan:   Problem List Items Addressed This Visit   None Visit Diagnoses     Urinary symptom or sign    -  Primary   Relevant Orders   Urinalysis, Routine w reflex microscopic        Follow up plan: No follow-ups on file.

## 2021-10-31 NOTE — Assessment & Plan Note (Signed)
Not under good control. Continues to have symptoms and leakage, worse at night. Complicated by acute UTI. Recommend to follow up with Urology

## 2021-11-02 ENCOUNTER — Telehealth: Payer: Self-pay

## 2021-11-02 LAB — URINE CULTURE

## 2021-11-02 NOTE — Progress Notes (Signed)
Please let Arthur Richardson know his urine does show infection, but Cipro should treat and clear this.  If any ongoing or worsening return to office.

## 2021-11-02 NOTE — Telephone Encounter (Signed)
Pt returned our call. Saw number and called. I do not see recommendations as noted. Please advise.  Pt states he has his phone on now and will answer.  Irena Reichmann, [facility]  11/02/2021  4:04 PM EDT Back to Top    Called patient to give him Jolene's recommendations. Unable to leave patient a message as voicemail box was full.    OK for [facility] to give note if patient calls back.    Irena Reichmann, [place]  11/02/2021  2:12 PM EDT     Spoke with patient and made him aware of Jolene's recommendations. Patient says since he started the antibiotics. He has noticed that his gums are raw. Patient says it is interfering with him chewing. Please advise?

## 2021-11-05 NOTE — Telephone Encounter (Signed)
Do you know Jolene's recommendations? I do not see them.

## 2021-11-05 NOTE — Telephone Encounter (Signed)
Patient was notified of Arthur Richardson's recommendations. Patient verbalized understanding and has no further questions.

## 2021-11-08 NOTE — Telephone Encounter (Signed)
error 

## 2021-11-15 ENCOUNTER — Encounter: Payer: Self-pay | Admitting: Family Medicine

## 2021-11-15 ENCOUNTER — Telehealth: Payer: Self-pay

## 2021-11-15 ENCOUNTER — Ambulatory Visit (INDEPENDENT_AMBULATORY_CARE_PROVIDER_SITE_OTHER): Payer: Medicare Other | Admitting: Family Medicine

## 2021-11-15 VITALS — BP 132/80 | HR 59 | Temp 97.4°F | Wt 203.4 lb

## 2021-11-15 DIAGNOSIS — N3000 Acute cystitis without hematuria: Secondary | ICD-10-CM

## 2021-11-15 DIAGNOSIS — E039 Hypothyroidism, unspecified: Secondary | ICD-10-CM

## 2021-11-15 LAB — MICROSCOPIC EXAMINATION
Epithelial Cells (non renal): NONE SEEN /hpf (ref 0–10)
RBC, Urine: NONE SEEN /hpf (ref 0–2)

## 2021-11-15 LAB — URINALYSIS, ROUTINE W REFLEX MICROSCOPIC
Bilirubin, UA: NEGATIVE
Glucose, UA: NEGATIVE
Ketones, UA: NEGATIVE
Nitrite, UA: NEGATIVE
Protein,UA: NEGATIVE
RBC, UA: NEGATIVE
Specific Gravity, UA: 1.02 (ref 1.005–1.030)
Urobilinogen, Ur: 1 mg/dL (ref 0.2–1.0)
pH, UA: 6.5 (ref 5.0–7.5)

## 2021-11-15 NOTE — Assessment & Plan Note (Signed)
Unclear if his jitteriness is from his thyroid. Will check labs and change to armour thyroid based on results. Recheck 6 weeks. Call with any concerns.

## 2021-11-15 NOTE — Telephone Encounter (Signed)
-----   Message from Valerie Roys, DO sent at 11/15/2021 10:16 AM EDT ----- Handicap form please

## 2021-11-15 NOTE — Progress Notes (Signed)
BP 132/80   Pulse (!) 59   Temp (!) 97.4 F (36.3 C)   Wt 203 lb 6.4 oz (92.3 kg)   SpO2 98%   BMI 30.93 kg/m    Subjective:    Patient ID: Arthur Richardson, male    DOB: 1951/03/31, 71 y.o.   MRN: [id number]  HPI: Arthur Richardson is a 71 y.o. male  Chief Complaint  Patient presents with   Urinary Tract Infection    Patient here to follow up, say symptoms have cleared up.    Medication Reaction    Patient states he thinks he's having side effects from levothyroxine. Patient states about 30 minutes after he takes that medication he feels off balanced, and his hands shake, feels jittery all over. Patient states his symptoms started about 2 years ago but have recently gotten worse.    URINARY SYMPTOMS Duration: about 3 weeks Dysuria: no Urinary frequency: yes Urgency: no Small volume voids: no Symptom severity: no Urinary incontinence: no Foul odor: no Hematuria: no Abdominal pain: no Back pain: no Suprapubic pain/pressure: no Flank pain: no Fever:  no Vomiting: no Relief with cranberry juice: no Relief with pyridium: no Status: better Previous urinary tract infection: yes Recurrent urinary tract infection: no Sexual activity: monogomous History of sexually transmitted disease: no Penile discharge: no Treatments attempted: antibiotics and increasing fluids   HYPOTHYROIDISM Thyroid control status:controlled Satisfied with current treatment? no Medication side effects: yes- jitteriness Medication compliance: good compliance Recent dose adjustment:no Fatigue: yes Cold intolerance: no Heat intolerance: no Weight gain: no Weight loss: no Constipation: no Diarrhea/loose stools: no Palpitations: no Lower extremity edema: no Anxiety/depressed mood: no  Relevant past medical, surgical, family and social history reviewed and updated as indicated. Interim medical history since our last visit reviewed. Allergies and medications reviewed and  updated.  Review of Systems  Constitutional: Negative.   Respiratory: Negative.    Cardiovascular: Negative.   Gastrointestinal: Negative.   Genitourinary: Negative.   Musculoskeletal:  Positive for arthralgias, myalgias, neck pain and neck stiffness. Negative for back pain, gait problem and joint swelling.  Neurological:  Positive for dizziness, tremors, weakness and light-headedness. Negative for seizures, syncope, facial asymmetry, speech difficulty, numbness and headaches.  Psychiatric/Behavioral:  Negative for agitation, behavioral problems, confusion, decreased concentration, dysphoric mood, hallucinations, self-injury, sleep disturbance and suicidal ideas. The patient is nervous/anxious. The patient is not hyperactive.     Per HPI unless specifically indicated above     Objective:    BP 132/80   Pulse (!) 59   Temp (!) 97.4 F (36.3 C)   Wt 203 lb 6.4 oz (92.3 kg)   SpO2 98%   BMI 30.93 kg/m   Wt Readings from Last 3 Encounters:  11/15/21 203 lb 6.4 oz (92.3 kg)  10/31/21 203 lb 9.6 oz (92.4 kg)  08/13/21 213 lb 3.2 oz (96.7 kg)    Physical Exam Vitals and nursing note reviewed.  Constitutional:      General: He is not in acute distress.    Appearance: Normal appearance. He is obese. He is not ill-appearing, toxic-appearing or diaphoretic.  HENT:     Head: Normocephalic and atraumatic.     Right Ear: External ear normal.     Left Ear: External ear normal.     Nose: Nose normal.     Mouth/Throat:     Mouth: Mucous membranes are moist.     Pharynx: Oropharynx is clear.  Eyes:     General: No scleral icterus.  Right eye: No discharge.        Left eye: No discharge.     Extraocular Movements: Extraocular movements intact.     Conjunctiva/sclera: Conjunctivae normal.     Pupils: Pupils are equal, round, and reactive to light.  Cardiovascular:     Rate and Rhythm: Normal rate and regular rhythm.     Pulses: Normal pulses.     Heart sounds: Normal heart  sounds. No murmur heard.    No friction rub. No gallop.  Pulmonary:     Effort: Pulmonary effort is normal. No respiratory distress.     Breath sounds: Normal breath sounds. No stridor. No wheezing, rhonchi or rales.  Chest:     Chest wall: No tenderness.  Musculoskeletal:        General: Normal range of motion.     Cervical back: Normal range of motion and neck supple.  Skin:    General: Skin is warm and dry.     Capillary Refill: Capillary refill takes less than 2 seconds.     Coloration: Skin is not jaundiced or pale.     Findings: No bruising, erythema, lesion or rash.  Neurological:     General: No focal deficit present.     Mental Status: He is alert and oriented to person, place, and time. Mental status is at baseline.  Psychiatric:        Mood and Affect: Mood normal.        Behavior: Behavior normal.        Thought Content: Thought content normal.        Judgment: Judgment normal.     Results for orders placed or performed in visit on 11/15/21  Microscopic Examination   Urine  Result Value Ref Range   WBC, UA 0-5 0 - 5 /hpf   RBC, Urine None seen 0 - 2 /hpf   Epithelial Cells (non renal) None seen 0 - 10 /hpf   Mucus, UA Present (A) Not Estab.   Bacteria, UA Few (A) None seen/Few  Urinalysis, Routine w reflex microscopic  Result Value Ref Range   Specific Gravity, UA 1.020 1.005 - 1.030   pH, UA 6.5 5.0 - 7.5   Color, UA Yellow Yellow   Appearance Ur Clear Clear   Leukocytes,UA Trace (A) Negative   Protein,UA Negative Negative/Trace   Glucose, UA Negative Negative   Ketones, UA Negative Negative   RBC, UA Negative Negative   Bilirubin, UA Negative Negative   Urobilinogen, Ur 1.0 0.2 - 1.0 mg/dL   Nitrite, UA Negative Negative   Microscopic Examination See below:       Assessment & Plan:   Problem List Items Addressed This Visit       Endocrine   Hypothyroidism    Unclear if his jitteriness is from his thyroid. Will check labs and change to armour  thyroid based on results. Recheck 6 weeks. Call with any concerns.       Relevant Orders   TSH   Other Visit Diagnoses     Acute cystitis without hematuria    -  Primary   Feeling better. Still has trace leuks. Will send for culture. Call with any concerns.    Relevant Orders   Urinalysis, Routine w reflex microscopic (Completed)   Urine Culture        Follow up plan: Return in about 6 weeks (around 12/27/2021).

## 2021-11-15 NOTE — Telephone Encounter (Signed)
Form completed, placed in signature folder.

## 2021-11-16 ENCOUNTER — Other Ambulatory Visit: Payer: Self-pay

## 2021-11-16 LAB — TSH: TSH: 1.55 u[IU]/mL (ref 0.450–4.500)

## 2021-11-16 MED ORDER — THYROID 30 MG PO TABS: 30.0000 mg | ORAL_TABLET | Freq: Every day | ORAL | 1 refills | Status: DC

## 2021-11-16 NOTE — Progress Notes (Signed)
error 

## 2021-11-16 NOTE — Addendum Note (Signed)
Addended by: Valerie Roys on: 11/16/2021 10:38 AM   Modules accepted: Orders

## 2021-11-17 LAB — URINE CULTURE: Organism ID, Bacteria: NO GROWTH

## 2021-11-22 ENCOUNTER — Ambulatory Visit: Payer: Medicare Other | Admitting: Family Medicine

## 2021-11-28 ENCOUNTER — Other Ambulatory Visit: Payer: Self-pay | Admitting: Family Medicine

## 2021-11-28 DIAGNOSIS — R251 Tremor, unspecified: Secondary | ICD-10-CM

## 2021-12-03 ENCOUNTER — Other Ambulatory Visit: Payer: Self-pay | Admitting: Family Medicine

## 2021-12-04 NOTE — Telephone Encounter (Signed)
Requested Prescriptions  Pending Prescriptions Disp Refills  . atorvastatin (LIPITOR) 40 MG tablet [Pharmacy Med Name: Atorvastatin Calcium 40 MG Oral Tablet] 90 tablet 1    Sig: TAKE 1 TABLET BY MOUTH DAILY     Cardiovascular:  Antilipid - Statins Failed - 12/03/2021 10:05 AM      Failed - Lipid Panel in normal range within the last 12 months    Cholesterol, Total  Date Value Ref Range Status  08/14/2021 149 100 - 199 mg/dL Final   LDL Chol Calc (NIH)  Date Value Ref Range Status  08/14/2021 79 0 - 99 mg/dL Final   HDL  Date Value Ref Range Status  08/14/2021 56 >39 mg/dL Final   Triglycerides  Date Value Ref Range Status  08/14/2021 73 0 - 149 mg/dL Final         Passed - Patient is not pregnant      Passed - Valid encounter within last 12 months    Recent Outpatient Visits          2 weeks ago Acute cystitis without hematuria   [place], Megan P, DO   1 month ago Urinary symptom or sign   [place], [place]man T, NP   3 months ago Primary hypertension   [place], Megan P, DO   4 months ago Chronic cluster headache, not intractable   [place], Megan P, DO   6 months ago Routine general medical examination at a health care facility   [place], [place], DO      Future Appointments            In 3 weeks Wynetta Emery, Barb Merino, DO [facility], [place]   In 2 months Wynetta Emery, Barb Merino, DO [facility], [place]   In 7 months Diamantina [place], Herbert Seta, MD [place]

## 2021-12-09 ENCOUNTER — Other Ambulatory Visit: Payer: Self-pay | Admitting: Family Medicine

## 2021-12-10 DIAGNOSIS — M7542 Impingement syndrome of left shoulder: Secondary | ICD-10-CM | POA: Diagnosis not present

## 2021-12-10 DIAGNOSIS — M5416 Radiculopathy, lumbar region: Secondary | ICD-10-CM | POA: Diagnosis not present

## 2021-12-10 DIAGNOSIS — M5136 Other intervertebral disc degeneration, lumbar region: Secondary | ICD-10-CM | POA: Diagnosis not present

## 2021-12-10 DIAGNOSIS — M47816 Spondylosis without myelopathy or radiculopathy, lumbar region: Secondary | ICD-10-CM | POA: Diagnosis not present

## 2021-12-10 DIAGNOSIS — M48061 Spinal stenosis, lumbar region without neurogenic claudication: Secondary | ICD-10-CM | POA: Diagnosis not present

## 2021-12-10 NOTE — Telephone Encounter (Signed)
Requested Prescriptions  Pending Prescriptions Disp Refills  . verapamil (CALAN-SR) 120 MG CR tablet [Pharmacy Med Name: VERAPAMIL '120MG'$  SR TABLET 12H] 100 tablet 0    Sig: TAKE 1 TABLET BY MOUTH AT  BEDTIME     Cardiovascular: Calcium Channel Blockers 3 Passed - 12/09/2021  8:24 AM      Passed - ALT in normal range and within 360 days    ALT  Date Value Ref Range Status  05/24/2021 31 0 - 44 IU/L Final         Passed - AST in normal range and within 360 days    AST  Date Value Ref Range Status  05/24/2021 25 0 - 40 IU/L Final         Passed - Cr in normal range and within 360 days    Creatinine, Ser  Date Value Ref Range Status  05/24/2021 1.05 0.76 - 1.27 mg/dL Final         Passed - Last BP in normal range    BP Readings from Last 1 Encounters:  11/15/21 132/80         Passed - Last Heart Rate in normal range    Pulse Readings from Last 1 Encounters:  11/15/21 (!) 59         Passed - Valid encounter within last 6 months    Recent Outpatient Visits          3 weeks ago Acute cystitis without hematuria   [place], Megan P, DO   1 month ago Urinary symptom or sign   [place], [place] T, NP   3 months ago Primary hypertension   [place], Megan P, DO   4 months ago Chronic cluster headache, not intractable   [place], Megan P, DO   6 months ago Routine general medical examination at a health care facility   [place], [place], DO      Future Appointments            In 2 weeks Wynetta Emery, Barb Merino, DO [facility], [place]   In 2 months Wynetta Emery, Barb Merino, DO [facility], [place]   In 7 months Diamantina [place], Herbert Seta, MD [facility]

## 2021-12-19 DIAGNOSIS — M1612 Unilateral primary osteoarthritis, left hip: Secondary | ICD-10-CM | POA: Diagnosis not present

## 2021-12-27 ENCOUNTER — Ambulatory Visit (INDEPENDENT_AMBULATORY_CARE_PROVIDER_SITE_OTHER): Payer: Medicare Other | Admitting: Family Medicine

## 2021-12-27 ENCOUNTER — Encounter: Payer: Self-pay | Admitting: Family Medicine

## 2021-12-27 VITALS — BP 165/84 | HR 55 | Temp 98.1°F | Wt 200.4 lb

## 2021-12-27 DIAGNOSIS — R251 Tremor, unspecified: Secondary | ICD-10-CM

## 2021-12-27 DIAGNOSIS — R2689 Other abnormalities of gait and mobility: Secondary | ICD-10-CM | POA: Diagnosis not present

## 2021-12-27 DIAGNOSIS — E039 Hypothyroidism, unspecified: Secondary | ICD-10-CM

## 2021-12-27 MED ORDER — LEVOTHYROXINE SODIUM 50 MCG PO TABS: 50.0000 ug | ORAL_TABLET | Freq: Every day | ORAL | 0 refills | Status: DC

## 2021-12-27 NOTE — Progress Notes (Signed)
BP (!) 165/84   Pulse (!) 55   Temp 98.1 F (36.7 C)   Wt 200 lb 6.4 oz (90.9 kg)   SpO2 98%   BMI 30.47 kg/m    Subjective:    Patient ID: Arthur Richardson, male    DOB: 09-Oct-1950, 71 y.o.   MRN: [id number]  HPI: Jowel Waltner is a 71 y.o. male  Chief Complaint  Patient presents with   Hypothyroidism    Patient states he tried the armour thyroid but was more expensive. Would like to go back to levothyroxine. Patient states he is still having jitteriness and tremors with new medication.    HYPOTHYROIDISM Thyroid control status:stable Satisfied with current treatment? no Medication side effects: no Medication compliance: excellent compliance Recent dose adjustment:yes Fatigue: no Cold intolerance: no Heat intolerance: no Weight gain: no Weight loss: no Constipation: no Diarrhea/loose stools: no Palpitations: no Lower extremity edema: no Anxiety/depressed mood: no  Having increased problems with tremor, brain fog, issues with balance. Feels like he is going to fall. He also feels jittery. No other concerns or complaints at this time.   Relevant past medical, surgical, family and social history reviewed and updated as indicated. Interim medical history since our last visit reviewed. Allergies and medications reviewed and updated.  Review of Systems  Constitutional: Negative.   Respiratory: Negative.    Cardiovascular: Negative.   Gastrointestinal: Negative.   Musculoskeletal: Negative.   Skin: Negative.   Neurological:  Positive for dizziness, tremors, weakness and light-headedness. Negative for seizures, syncope, facial asymmetry, speech difficulty, numbness and headaches.  Psychiatric/Behavioral:  Positive for confusion and decreased concentration. Negative for agitation, behavioral problems, dysphoric mood, hallucinations, self-injury, sleep disturbance and suicidal ideas. The patient is not nervous/anxious and is not hyperactive.     Per HPI unless  specifically indicated above     Objective:    BP (!) 165/84   Pulse (!) 55   Temp 98.1 F (36.7 C)   Wt 200 lb 6.4 oz (90.9 kg)   SpO2 98%   BMI 30.47 kg/m   Wt Readings from Last 3 Encounters:  12/27/21 200 lb 6.4 oz (90.9 kg)  11/15/21 203 lb 6.4 oz (92.3 kg)  10/31/21 203 lb 9.6 oz (92.4 kg)    Physical Exam Vitals and nursing note reviewed.  Constitutional:      General: He is not in acute distress.    Appearance: Normal appearance. He is normal weight. He is not ill-appearing, toxic-appearing or diaphoretic.  HENT:     Head: Normocephalic and atraumatic.     Right Ear: External ear normal.     Left Ear: External ear normal.     Nose: Nose normal.     Mouth/Throat:     Mouth: Mucous membranes are moist.     Pharynx: Oropharynx is clear.  Eyes:     General: No scleral icterus.       Right eye: No discharge.        Left eye: No discharge.     Extraocular Movements: Extraocular movements intact.     Conjunctiva/sclera: Conjunctivae normal.     Pupils: Pupils are equal, round, and reactive to light.  Cardiovascular:     Rate and Rhythm: Normal rate and regular rhythm.     Pulses: Normal pulses.     Heart sounds: Normal heart sounds. No murmur heard.    No friction rub. No gallop.  Pulmonary:     Effort: Pulmonary effort is normal. No respiratory distress.  Breath sounds: Normal breath sounds. No stridor. No wheezing, rhonchi or rales.  Chest:     Chest wall: No tenderness.  Musculoskeletal:        General: Normal range of motion.     Cervical back: Normal range of motion and neck supple.  Skin:    General: Skin is warm and dry.     Capillary Refill: Capillary refill takes less than 2 seconds.     Coloration: Skin is not jaundiced or pale.     Findings: No bruising, erythema, lesion or rash.  Neurological:     General: No focal deficit present.     Mental Status: He is alert and oriented to person, place, and time. Mental status is at baseline.   Psychiatric:        Mood and Affect: Mood normal.        Behavior: Behavior normal.        Thought Content: Thought content normal.        Judgment: Judgment normal.     Results for orders placed or performed in visit on 11/15/21  Microscopic Examination   Urine  Result Value Ref Range   WBC, UA 0-5 0 - 5 /hpf   RBC, Urine None seen 0 - 2 /hpf   Epithelial Cells (non renal) None seen 0 - 10 /hpf   Mucus, UA Present (A) Not Estab.   Bacteria, UA Few (A) None seen/Few  Urine Culture   Specimen: Urine   UR  Result Value Ref Range   Urine Culture, Routine Final report    Organism ID, Bacteria No growth   Urinalysis, Routine w reflex microscopic  Result Value Ref Range   Specific Gravity, UA 1.020 1.005 - 1.030   pH, UA 6.5 5.0 - 7.5   Color, UA Yellow Yellow   Appearance Ur Clear Clear   Leukocytes,UA Trace (A) Negative   Protein,UA Negative Negative/Trace   Glucose, UA Negative Negative   Ketones, UA Negative Negative   RBC, UA Negative Negative   Bilirubin, UA Negative Negative   Urobilinogen, Ur 1.0 0.2 - 1.0 mg/dL   Nitrite, UA Negative Negative   Microscopic Examination See below:   TSH  Result Value Ref Range   TSH 1.550 0.450 - 4.500 uIU/mL      Assessment & Plan:   Problem List Items Addressed This Visit       Endocrine   Hypothyroidism - Primary    Would like to go back on his levothyroxine. We will hold on checking labs today and restart levothyroxine. Recheck labs 6 weeks.       Relevant Medications   levothyroxine (SYNTHROID) 50 MCG tablet   Other Visit Diagnoses     Balance problem       Will get him some PT. Continue to monitor.    Relevant Orders   Ambulatory referral to Physical Therapy   Tremor       Has not seen neurology. Appt scheduled today. Await their input.         Follow up plan: Return in about 6 weeks (around 02/07/2022).

## 2021-12-27 NOTE — Patient Instructions (Addendum)
[facility] Neurology 2:45 Thursday Jan 31, 2022 [address], [place], [place] [address] Phone: [phone]

## 2021-12-27 NOTE — Assessment & Plan Note (Signed)
Would like to go back on his levothyroxine. We will hold on checking labs today and restart levothyroxine. Recheck labs 6 weeks.

## 2022-01-02 DIAGNOSIS — M5416 Radiculopathy, lumbar region: Secondary | ICD-10-CM | POA: Diagnosis not present

## 2022-01-02 DIAGNOSIS — M5126 Other intervertebral disc displacement, lumbar region: Secondary | ICD-10-CM | POA: Diagnosis not present

## 2022-01-02 DIAGNOSIS — M48061 Spinal stenosis, lumbar region without neurogenic claudication: Secondary | ICD-10-CM | POA: Diagnosis not present

## 2022-01-22 ENCOUNTER — Ambulatory Visit (INDEPENDENT_AMBULATORY_CARE_PROVIDER_SITE_OTHER): Payer: Medicare Other

## 2022-01-22 DIAGNOSIS — Z23 Encounter for immunization: Secondary | ICD-10-CM

## 2022-01-29 ENCOUNTER — Other Ambulatory Visit: Payer: Self-pay | Admitting: Family Medicine

## 2022-01-29 DIAGNOSIS — N138 Other obstructive and reflux uropathy: Secondary | ICD-10-CM

## 2022-01-29 NOTE — Telephone Encounter (Signed)
Requested Prescriptions  Pending Prescriptions Disp Refills  . tamsulosin (FLOMAX) 0.4 MG CAPS capsule [Pharmacy Med Name: Tamsulosin HCl 0.4 MG Oral Capsule] 100 capsule 2    Sig: TAKE 1 CAPSULE BY MOUTH DAILY     Urology: Alpha-Adrenergic Blocker Failed - 01/29/2022  2:40 AM      Failed - Last BP in normal range    BP Readings from Last 1 Encounters:  12/27/21 (!) 165/84         Passed - PSA in normal range and within 360 days    Prostate Specific Ag, Serum  Date Value Ref Range Status  05/24/2021 2.5 0.0 - 4.0 ng/mL Final    Comment:    Roche ECLIA methodology. According to the American Urological Association, Serum PSA should decrease and remain at undetectable levels after radical prostatectomy. The AUA defines biochemical recurrence as an initial PSA value 0.2 ng/mL or greater followed by a subsequent confirmatory PSA value 0.2 ng/mL or greater. Values obtained with different assay methods or kits cannot be used interchangeably. Results cannot be interpreted as absolute evidence of the presence or absence of malignant disease.          Passed - Valid encounter within last 12 months    Recent Outpatient Visits          1 month ago Hypothyroidism, unspecified type   [place], Megan P, DO   2 months ago Acute cystitis without hematuria   [place], Megan P, DO   3 months ago Urinary symptom or sign   [facility] [place], [place] T, NP   5 months ago Primary hypertension   [place], [place], DO   6 months ago Chronic cluster headache, not intractable   [place], [place], DO      Future Appointments            In 2 weeks Wynetta Emery, Barb Merino, DO [facility], [place]   In 5 months Diamantina [place], Herbert Seta, MD [place]

## 2022-01-31 ENCOUNTER — Ambulatory Visit: Payer: Medicare Other | Admitting: Neurology

## 2022-01-31 ENCOUNTER — Telehealth: Payer: Self-pay | Admitting: Neurology

## 2022-01-31 ENCOUNTER — Encounter: Payer: Self-pay | Admitting: Neurology

## 2022-01-31 VITALS — BP 139/86 | HR 73 | Ht 68.0 in | Wt 200.4 lb

## 2022-01-31 DIAGNOSIS — R498 Other voice and resonance disorders: Secondary | ICD-10-CM | POA: Diagnosis not present

## 2022-01-31 DIAGNOSIS — R269 Unspecified abnormalities of gait and mobility: Secondary | ICD-10-CM

## 2022-01-31 DIAGNOSIS — Z8616 Personal history of COVID-19: Secondary | ICD-10-CM

## 2022-01-31 DIAGNOSIS — G20C Parkinsonism, unspecified: Secondary | ICD-10-CM | POA: Diagnosis not present

## 2022-01-31 DIAGNOSIS — M25551 Pain in right hip: Secondary | ICD-10-CM

## 2022-01-31 DIAGNOSIS — M25552 Pain in left hip: Secondary | ICD-10-CM | POA: Diagnosis not present

## 2022-01-31 NOTE — Patient Instructions (Addendum)
It was nice to meet you both today.  I think you have signs and symptoms of parkinson's disease or Parkinson's-like disease, what we call parkinsonism.   This is what we discussed today:    Remember to drink plenty of fluid at least 6 glasses (8 oz each), eat healthy meals and do not skip any meals. Try to eat protein with a every meal and eat a healthy snack such as fruit or nuts in between meals. Try to keep a regular sleep-wake schedule and try to exercise daily, particularly in the form of walking, 20-30 minutes a day, if you can.  Please limit your alcohol intake to less than 1 beer per day, instead of 1 to 2/day. Please limit your caffeine intake to up to 2 servings per day of soda, try to increase your water intake to about 3 bottles per day. Please work on eliminating all tobacco and nicotine products.   As far as your medications are concerned, we may consider a medication called Sinemet (generic name: carbidopa-levodopa) at our next appointment.   As far as diagnostic testing, I would like to order a DaT scan: This is a specialized brain scan designed to help with diagnosis of tremor disorders. A radioactive marker gets injected and the uptake is measured in the brain and compared to normal controls and right side is compared to the left, a change in uptake can help with diagnosis of certain tremor disorders. A brain MRI on the other hand is a brain scan that helps look at the brain structure in more detail overall and look for age-related changes, blood vessel related changes and look for stroke and volume loss which we call atrophy.  However, if your kidney function is not good enough, we may not be able to proceed with the DaTscan.  We will check 1 blood test today to check on your kidney function which was impaired last year. We will plan a follow-up after your scan.   Our phone number is [phone]. We also have an after hours call service for urgent matters and there is a physician  on-call for urgent questions, that cannot wait till the next work day. For any emergencies you know to call 911 or go to the nearest emergency room.

## 2022-01-31 NOTE — Progress Notes (Signed)
Subjective:    Patient ID: Arthur Richardson is a 71 y.o. male.  HPI    Star Age, MD, PhD [facility] [address], [address] [place], [place] [address]  Dear Dr. Wynetta Emery,   I saw your patient, Arthur Richardson, your kind request in my neurologic clinic today for initial consultation of his tremor. The patient is unaccompanied today.  As you know, Arthur Richardson is a 71 year old gentleman with an underlying medical history of hypertension, hyperlipidemia, hypothyroidism, and borderline obesity, who reports an approximately 1+ year history of left more than right upper extremity tremors, difficulty with dexterity, left more than right, difficulty walking, weakness on the left side, difficulty with his speech, occasional difficulty swallowing.  He reports that symptoms started a few months after he was diagnosed with COVID in 2022.  He had COVID per patient and wife in January 2022, he did not go to the hospital or emergency room, he did not get any antiviral treatment.  He stayed in bed, he felt that he had pneumonia but did not actually get checked out at the time.  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  He is right-handed.  His wife helps him in his taxidermy business and their son helps out but he has another job and works overnight.  They are 70 year old son lives with them.  He has no family history of Parkinson's disease.  He quit smoking in the early 80s but used to tobacco every day.  He does not drink much in the way of water, maybe less than 1 bottle per day, he likes to drink chair wine, about 2 bottles per day or diet [facility].  He does drink alcohol daily in the form of beer, 1 or 2/day.  He reports a remote history of right-sided Bell's palsy in 2004 or 2005.  He has chronic low back pain and had nerve ablation as I understand.  He also had epidural  injections.  He has bilateral hip pain and has received a left hip injection.  I reviewed your office note from 11/15/2021.  He had a TSH checked at the time, it was normal at 1.55.  He has previously been followed by Dr. Melrose Nakayama in neurology at [facility] clinic I was able to review office visit dates but not the encounter note, last seen on 10/11/2020. It looks like he had an EMG and nerve conduction velocity test at the time.  He had an office visit with Dr. Melrose Nakayama on 09/21/2020 and I was able to review the office note.  Patient was on gabapentin at the time and reported weakness in the left arm and leg.  Reportedly he had a brain MRI on 09/05/2020 and I reviewed the result in Dr. Lannie Fields note: Impression: No evidence of acute intracranial abnormality.  Mild cerebral atrophy and chronic small vessel ischemic disease.  Paranasal sinus disease, as described.    He also had an MRI of the lumbar spine on 08/15/2020 with impression: Right L4-5 foraminal protrusion abutting the exiting L4 nerve root with mild bilateral neural foraminal narrowing.  Mild L3-4 spinal canal narrowing.  Mild left L2-3 and bilateral L3-4 neuroforaminal narrowing.  Disc bulge abutment of the exiting right L3 nerve root.    He had an MRI of the left shoulder on 08/15/2020 and I reviewed the results: IMPRESSION:  Mild-to-moderate appearing rotator cuff tendinopathy without tear.  Moderate acromioclavicular osteoarthritis.   Small volume of subacromial/subdeltoid fluid compatible with  bursitis.    His Past Medical History Is Significant For: Past Medical History:  Diagnosis Date   Hyperlipidemia    Hypertension     His Past Surgical History Is Significant For: Past Surgical History:  Procedure Laterality Date   LITHOTRIPSY  09/1998 & 07/29/1999   Due to nephrolithiasis   PROSTATE SURGERY     Biopsy= Negative   TONSILLECTOMY      His Family History Is Significant For: Family History  Problem Relation Age of Onset    Stroke Father    Cancer Sister        Pancreatic Cancer   Parkinson's disease Neg Hx    Tremor Neg Hx     His Social History Is Significant For: Social History   Socioeconomic History   Marital status: Married    Spouse name: Not on file   Number of children: Not on file   Years of education: Not on file   Highest education level: Not on file  Occupational History   Not on file  Tobacco Use   Smoking status: Former    Types: Cigarettes    Quit date: 01/31/1980    Years since quitting: 42.0    Passive exposure: Past   Smokeless tobacco: Current    Types: Chew  Vaping Use   Vaping Use: Never used  Substance and Sexual Activity   Alcohol use: Yes    Alcohol/week: 14.0 standard drinks of alcohol    Types: 14 Cans of beer per week   Drug use: No   Sexual activity: Yes  Other Topics Concern   Not on file  Social History Narrative   Not on file   Social Determinants of Health   Financial Resource Strain: Low Risk  (04/09/2021)   Overall Financial Resource Strain (CARDIA)    Difficulty of Paying Living Expenses: Not hard at all  Food Insecurity: No Food Insecurity (04/09/2021)   Hunger Vital Sign    Worried About Running Out of Food in the Last Year: Never true    Ran Out of Food in the Last Year: Never true  Transportation Needs: No Transportation Needs (04/09/2021)   PRAPARE - Hydrologist (Medical): No    Lack of Transportation (Non-Medical): No  Physical Activity: Insufficiently Active (04/09/2021)   Exercise Vital Sign    Days of Exercise per Week: 3 days    Minutes of Exercise per Session: 40 min  Stress: No Stress Concern Present (04/09/2021)   [place]    Feeling of Stress : Not at all  Social Connections: Moderately Integrated (04/09/2021)   Social Connection and Isolation Panel [NHANES]    Frequency of Communication with Friends and Family: More than three  times a week    Frequency of Social Gatherings with Friends and Family: More than three times a week    Attends Religious Services: More than 4 times per year    Active Member of [employer] or Organizations: No    Attends Archivist Meetings: Never    Marital Status: Married    His Allergies Are:  Allergies  Allergen Reactions   Penicillins   :   His Current Medications Are:  Outpatient Encounter Medications as of 01/31/2022  Medication Sig   aspirin EC 81 MG tablet Take 81 mg by mouth daily.   atorvastatin (LIPITOR) 40 MG tablet TAKE 1  TABLET BY MOUTH DAILY   dutasteride (AVODART) 0.5 MG capsule Take 0.5 mg by mouth daily.   furosemide (LASIX) 40 MG tablet Take 1 tablet (40 mg total) by mouth daily as needed.   levothyroxine (SYNTHROID) 50 MCG tablet Take 1 tablet (50 mcg total) by mouth daily before breakfast.   losartan (COZAAR) 100 MG tablet TAKE 1 TABLET BY MOUTH DAILY   omeprazole (PRILOSEC) 40 MG capsule TAKE 1 CAPSULE BY MOUTH DAILY   tamsulosin (FLOMAX) 0.4 MG CAPS capsule Take 1 capsule (0.4 mg total) by mouth daily.   verapamil (CALAN-SR) 120 MG CR tablet TAKE 1 TABLET BY MOUTH AT  BEDTIME   No facility-administered encounter medications on file as of 01/31/2022.  :   Review of Systems:  Out of a complete 14 point review of systems, all are reviewed and negative with the exception of these symptoms as listed below:  Review of Systems  Neurological:        Pt here for tremor consult Pt states he had covid  Jan 2022 Pt states after that he started to have tremors in left hand Pt states he feels like he is moving in slow motion Pt states gait is off Pt speech tone is low . Pt states his back hurts  Pt states he is stiff getting up out of chair and out of bed     Objective:  Neurological Exam  Physical Exam Physical Examination:   Vitals:   01/31/22 0913  BP: 139/86  Pulse: 73    General Examination: The patient is a very pleasant 71 y.o. male in no acute  distress. He appears deconditioned, well-groomed.   HEENT: Normocephalic, atraumatic, pupils are equal, round and reactive to light, extraocular tracking is mildly impaired, corrective eyeglasses in place.  Hearing is grossly intact.  Face is very slightly asymmetric, mild facial masking noted, mild to moderate nuchal rigidity noted, no facial tremor, no neck tremor.  He has moderate to severe hypophonia with fluctuating voice and raspiness noted.  He has no voice tremor.  No obvious dysarthria.  Neck with decreased passive range of motion.  No carotid bruits.  Mild mouth dryness noted, tongue protrudes centrally.   Chest: Clear to auscultation without wheezing, rhonchi or crackles noted.  Heart: S1+S2+0, regular and normal without murmurs, rubs or gallops noted.   Abdomen: Soft, non-tender and non-distended.  Extremities: There is no pitting edema in the distal lower extremities bilaterally.   Skin: Warm and dry without trophic changes noted.   Musculoskeletal: exam reveals no obvious joint deformities but reports low back pain and bilateral hip pain.   Neurologically:  Mental status: The patient is awake, alert and oriented in all 4 spheres. His immediate and remote memory, attention, language skills and fund of knowledge are appropriate. There is no evidence of aphasia, agnosia, apraxia or anomia. Speech as above.  Thought process is linear. Mood is normal and affect is normal.  Cranial nerves II - XII are as described above under HEENT exam.  Motor exam: Normal bulk, global strength normal, increased tone in both upper extremities with left upper extremity cogwheeling noted, no telltale resting tremor.  On Archimedes spiral drawing he has minimal trembling with the right hand, moderate trembling with the left hand, handwriting with the right hand is slightly tremulous, legible, not particularly micrographic but on the smaller side at the end.    He has a upper extremity postural tremor, no  significant action tremor in either hand or arm and  no intention tremor, no lower extremity tremor.    Reflexes are 1+ in the upper extremities and trace in the knees, absent in the ankles.  Fine motor skills and coordination: He has moderate difficulty with finger taps and hand movements and rapid alternating patting with the left hand, to moderate impairment on the right, foot taps are moderately impaired on the left and better on the right, mild to moderately impaired.   Cerebellar testing: No dysmetria or intention tremor. There is no truncal or gait ataxia.  Finger-to-nose is doable without dysmetria but he has difficulty with heel-to-shin bilaterally due to hip pain bilaterally. Sensory exam: intact to light touch in the upper and lower extremities.   Gait, station and balance: He stands with difficulty and pushes himself up.  Posture is mildly stooped for age.  He walks with decreased stride length and pace, no telltale shuffling, decreased arm swing bilaterally, left more than right and slight difficulty with turns, no walking aid.  Assessment and Plan:   In summary, Haji Delaine is a very pleasant 71 y.o.-year old male with an underlying medical history of hypertension, hyperlipidemia, hypothyroidism, and borderline obesity, who presents for evaluation of his tremor disorder of at least 1 years duration.  He has noticed difficulty with his walking, his fine motor skills, and changes in his voice.  History and examination are concerning for parkinsonism, possibly left-sided predominant Parkinson's disease, no telltale resting tremor today on exam but he has noticed an intermittent tremor.  I talked to the patient and his wife at length today about the diagnosis of parkinsonism and Parkinson's disease.  We talked about the importance of maintaining a healthy lifestyle and staying active.  He is advised to work on complete tobacco/nicotine cessation, increase his water intake to drink at  least 3 bottles of water per day and reduce his caffeine intake, limit himself to up to 2 servings of soda per day.  Furthermore, he is advised to avoid drinking alcohol daily, limit himself to less than 1 beer per day.  We may consider Parkinson's medication such as generic Sinemet at our next visit, for now, I would like to proceed with a brain DaTscan.  We will check his chemistry panel, he has had elevated creatinine last year, if he has ongoing issues with mild kidney impairment, we may not be able to pursue the DaTscan.  He was given written information and written instructions for today's visit.  This was an extended visit over 1 hour with extended chart review as well.   For now, we will plan to follow up after the DaTscan.  Several their questions today and the patient and his wife were in agreement.  Thank you very much for allowing me to participate in the care of this nice patient. If I can be of any further assistance to you please do not hesitate to call me at [phone].  Sincerely,   Star Age, MD, PhD

## 2022-01-31 NOTE — Telephone Encounter (Signed)
[employer] medicare NPR sent to [facility] [phone]

## 2022-02-01 LAB — COMPREHENSIVE METABOLIC PANEL
ALT: 23 IU/L (ref 0–44)
AST: 19 IU/L (ref 0–40)
Albumin/Globulin Ratio: 2 (ref 1.2–2.2)
Albumin: 4.3 g/dL (ref 3.8–4.8)
Alkaline Phosphatase: 136 IU/L — ABNORMAL HIGH (ref 44–121)
BUN/Creatinine Ratio: 11 (ref 10–24)
BUN: 11 mg/dL (ref 8–27)
Bilirubin Total: 0.6 mg/dL (ref 0.0–1.2)
CO2: 21 mmol/L (ref 20–29)
Calcium: 9.8 mg/dL (ref 8.6–10.2)
Chloride: 102 mmol/L (ref 96–106)
Creatinine, Ser: 0.97 mg/dL (ref 0.76–1.27)
Globulin, Total: 2.1 g/dL (ref 1.5–4.5)
Glucose: 83 mg/dL (ref 70–99)
Potassium: 4.3 mmol/L (ref 3.5–5.2)
Sodium: 139 mmol/L (ref 134–144)
Total Protein: 6.4 g/dL (ref 6.0–8.5)
eGFR: 82 mL/min/{1.73_m2} (ref >59)

## 2022-02-04 ENCOUNTER — Telehealth: Payer: Self-pay

## 2022-02-04 NOTE — Telephone Encounter (Signed)
Pt verified by name and DOB,  normal results given per provider, pt voiced understanding all question answered. °

## 2022-02-06 DIAGNOSIS — H02831 Dermatochalasis of right upper eyelid: Secondary | ICD-10-CM | POA: Diagnosis not present

## 2022-02-08 ENCOUNTER — Other Ambulatory Visit: Payer: Self-pay | Admitting: Family Medicine

## 2022-02-08 NOTE — Telephone Encounter (Signed)
Last labs 08/14/21. Requested Prescriptions  Pending Prescriptions Disp Refills  . atorvastatin (LIPITOR) 40 MG tablet [Pharmacy Med Name: Atorvastatin Calcium 40 MG Oral Tablet] 100 tablet 2    Sig: TAKE 1 TABLET BY MOUTH DAILY     Cardiovascular:  Antilipid - Statins Failed - 02/08/2022  8:46 AM      Failed - Lipid Panel in normal range within the last 12 months    Cholesterol, Total  Date Value Ref Range Status  08/14/2021 149 100 - 199 mg/dL Final   LDL Chol Calc (NIH)  Date Value Ref Range Status  08/14/2021 79 0 - 99 mg/dL Final   HDL  Date Value Ref Range Status  08/14/2021 56 >39 mg/dL Final   Triglycerides  Date Value Ref Range Status  08/14/2021 73 0 - 149 mg/dL Final         Passed - Patient is not pregnant      Passed - Valid encounter within last 12 months    Recent Outpatient Visits          1 month ago Hypothyroidism, unspecified type   [place], Megan P, DO   2 months ago Acute cystitis without hematuria   [place], Megan P, DO   3 months ago Urinary symptom or sign   [place], Ohlman T, NP   5 months ago Primary hypertension   [employer], [place], DO   6 months ago Chronic cluster headache, not intractable   [place], [place], DO      Future Appointments            In 1 week Wynetta Emery, Barb Merino, DO [employer], Lupus   In 5 months Diamantina [place], Herbert Seta, MD [place]

## 2022-02-18 ENCOUNTER — Ambulatory Visit (INDEPENDENT_AMBULATORY_CARE_PROVIDER_SITE_OTHER): Payer: Medicare Other | Admitting: Family Medicine

## 2022-02-18 ENCOUNTER — Encounter: Payer: Self-pay | Admitting: Family Medicine

## 2022-02-18 VITALS — BP 138/82 | HR 60 | Wt 200.6 lb

## 2022-02-18 DIAGNOSIS — E7849 Other hyperlipidemia: Secondary | ICD-10-CM | POA: Diagnosis not present

## 2022-02-18 DIAGNOSIS — I10 Essential (primary) hypertension: Secondary | ICD-10-CM

## 2022-02-18 DIAGNOSIS — M5136 Other intervertebral disc degeneration, lumbar region: Secondary | ICD-10-CM

## 2022-02-18 DIAGNOSIS — N401 Enlarged prostate with lower urinary tract symptoms: Secondary | ICD-10-CM

## 2022-02-18 DIAGNOSIS — K209 Esophagitis, unspecified without bleeding: Secondary | ICD-10-CM

## 2022-02-18 DIAGNOSIS — N138 Other obstructive and reflux uropathy: Secondary | ICD-10-CM | POA: Diagnosis not present

## 2022-02-18 DIAGNOSIS — K2270 Barrett's esophagus without dysplasia: Secondary | ICD-10-CM

## 2022-02-18 DIAGNOSIS — E039 Hypothyroidism, unspecified: Secondary | ICD-10-CM | POA: Diagnosis not present

## 2022-02-18 MED ORDER — VERAPAMIL HCL ER 120 MG PO TBCR: 120.0000 mg | EXTENDED_RELEASE_TABLET | Freq: Every day | ORAL | 1 refills | Status: DC

## 2022-02-18 MED ORDER — OMEPRAZOLE 40 MG PO CPDR: 40.0000 mg | DELAYED_RELEASE_CAPSULE | Freq: Every day | ORAL | 1 refills | Status: DC

## 2022-02-18 MED ORDER — FUROSEMIDE 40 MG PO TABS: 40.0000 mg | ORAL_TABLET | Freq: Every day | ORAL | 1 refills | Status: DC | PRN

## 2022-02-18 MED ORDER — ATORVASTATIN CALCIUM 40 MG PO TABS: 40.0000 mg | ORAL_TABLET | Freq: Every day | ORAL | 2 refills | Status: DC

## 2022-02-18 MED ORDER — LOSARTAN POTASSIUM 100 MG PO TABS: 100.0000 mg | ORAL_TABLET | Freq: Every day | ORAL | 1 refills | Status: DC

## 2022-02-18 MED ORDER — TAMSULOSIN HCL 0.4 MG PO CAPS: 0.4000 mg | ORAL_CAPSULE | Freq: Every day | ORAL | 1 refills | Status: DC

## 2022-02-18 NOTE — Assessment & Plan Note (Signed)
Under good control on current regimen. Continue current regimen. Continue to monitor. Call with any concerns. Refills given. Labs drawn today.   

## 2022-02-18 NOTE — Assessment & Plan Note (Signed)
Rechecking labs today. Await results. Treat as needed.  °

## 2022-02-18 NOTE — Progress Notes (Signed)
BP 138/82   Pulse 60   Wt 200 lb 9.6 oz (91 kg)   SpO2 98%   BMI 30.50 kg/m    Subjective:    Patient ID: Arthur Richardson, male    DOB: May 10, 1950, 71 y.o.   MRN: [id number]  HPI: Arthur Richardson is a 71 y.o. male  Chief Complaint  Patient presents with   Hypertension   Hypothyroidism   Benign Prostatic Hypertrophy   Tremors    Patient states he saw neurology and they are going to be scheduling a DATscan on him.    HYPERTENSION / HYPERLIPIDEMIA Satisfied with current treatment? yes Duration of hypertension: chronic BP monitoring frequency: not checking BP medication side effects: no Past BP meds: losartan, verapamil, lasix Duration of hyperlipidemia: chronic Cholesterol medication side effects: no Cholesterol supplements: none Past cholesterol medications: atorvastatin Medication compliance: excellent compliance Aspirin: no Recent stressors: no Recurrent headaches: no Visual changes: no Palpitations: no Dyspnea: no Chest pain: no Lower extremity edema: no Dizzy/lightheaded: no  HYPOTHYROIDISM Thyroid control status:controlled Satisfied with current treatment? yes Medication side effects: no Medication compliance: excellent compliance Etiology of hypothyroidism:  Recent dose adjustment:no Fatigue: no Cold intolerance: no Heat intolerance: no Weight gain: no Weight loss: no Constipation: no Diarrhea/loose stools: no Palpitations: no Lower extremity edema: no Anxiety/depressed mood: no  Relevant past medical, surgical, family and social history reviewed and updated as indicated. Interim medical history since our last visit reviewed. Allergies and medications reviewed and updated.  Review of Systems  Constitutional: Negative.   Respiratory: Negative.    Cardiovascular: Negative.   Musculoskeletal: Negative.   Neurological: Negative.   Psychiatric/Behavioral: Negative.      Per HPI unless specifically indicated above     Objective:     BP 138/82   Pulse 60   Wt 200 lb 9.6 oz (91 kg)   SpO2 98%   BMI 30.50 kg/m   Wt Readings from Last 3 Encounters:  02/18/22 200 lb 9.6 oz (91 kg)  01/31/22 200 lb 6.4 oz (90.9 kg)  12/27/21 200 lb 6.4 oz (90.9 kg)    Physical Exam Vitals and nursing note reviewed.  Constitutional:      General: He is not in acute distress.    Appearance: Normal appearance. He is not ill-appearing, toxic-appearing or diaphoretic.  HENT:     Head: Normocephalic and atraumatic.     Right Ear: External ear normal.     Left Ear: External ear normal.     Nose: Nose normal.     Mouth/Throat:     Mouth: Mucous membranes are moist.     Pharynx: Oropharynx is clear.  Eyes:     General: No scleral icterus.       Right eye: No discharge.        Left eye: No discharge.     Extraocular Movements: Extraocular movements intact.     Conjunctiva/sclera: Conjunctivae normal.     Pupils: Pupils are equal, round, and reactive to light.  Cardiovascular:     Rate and Rhythm: Normal rate and regular rhythm.     Pulses: Normal pulses.     Heart sounds: Normal heart sounds. No murmur heard.    No friction rub. No gallop.  Pulmonary:     Effort: Pulmonary effort is normal. No respiratory distress.     Breath sounds: Normal breath sounds. No stridor. No wheezing, rhonchi or rales.  Chest:     Chest wall: No tenderness.  Musculoskeletal:  General: Normal range of motion.     Cervical back: Normal range of motion and neck supple.  Skin:    General: Skin is warm and dry.     Capillary Refill: Capillary refill takes less than 2 seconds.     Coloration: Skin is not jaundiced or pale.     Findings: No bruising, erythema, lesion or rash.  Neurological:     General: No focal deficit present.     Mental Status: He is alert and oriented to person, place, and time. Mental status is at baseline.  Psychiatric:        Mood and Affect: Mood normal.        Behavior: Behavior normal.        Thought Content: Thought  content normal.        Judgment: Judgment normal.     Results for orders placed or performed in visit on 01/31/22  Comprehensive metabolic panel  Result Value Ref Range   Glucose 83 70 - 99 mg/dL   BUN 11 8 - 27 mg/dL   Creatinine, Ser 0.97 0.76 - 1.27 mg/dL   eGFR 82 >59 mL/min/1.73   BUN/Creatinine Ratio 11 10 - 24   Sodium 139 134 - 144 mmol/L   Potassium 4.3 3.5 - 5.2 mmol/L   Chloride 102 96 - 106 mmol/L   CO2 21 20 - 29 mmol/L   Calcium 9.8 8.6 - 10.2 mg/dL   Total Protein 6.4 6.0 - 8.5 g/dL   Albumin 4.3 3.8 - 4.8 g/dL   Globulin, Total 2.1 1.5 - 4.5 g/dL   Albumin/Globulin Ratio 2.0 1.2 - 2.2   Bilirubin Total 0.6 0.0 - 1.2 mg/dL   Alkaline Phosphatase 136 (H) 44 - 121 IU/L   AST 19 0 - 40 IU/L   ALT 23 0 - 44 IU/L      Assessment & Plan:   Problem List Items Addressed This Visit       Cardiovascular and Mediastinum   Hypertension    Under good control on current regimen. Continue current regimen. Continue to monitor. Call with any concerns. Refills given. Labs drawn today.        Relevant Medications   verapamil (CALAN-SR) 120 MG CR tablet   losartan (COZAAR) 100 MG tablet   furosemide (LASIX) 40 MG tablet   atorvastatin (LIPITOR) 40 MG tablet   Other Relevant Orders   Comprehensive metabolic panel   CBC with Differential/Platelet     Digestive   Barrett's esophagus with esophagitis   Relevant Medications   omeprazole (PRILOSEC) 40 MG capsule     Endocrine   Hypothyroidism - Primary    Rechecking labs today. Await results. Treat as needed.       Relevant Orders   Comprehensive metabolic panel   CBC with Differential/Platelet   TSH     Musculoskeletal and Integument   DDD (degenerative disc disease), lumbar    Pain is worse again. Not feeling better with shots and RFA- would like to see neurosurgery. Referral generated today.      Relevant Orders   Ambulatory referral to Neurosurgery     Genitourinary   BPH with obstruction/lower urinary  tract symptoms    Under good control on current regimen. Continue current regimen. Continue to monitor. Call with any concerns. Refills given.        Relevant Medications   tamsulosin (FLOMAX) 0.4 MG CAPS capsule     Other   Hyperlipidemia    Under good control on current  regimen. Continue current regimen. Continue to monitor. Call with any concerns. Refills given. Labs drawn today.        Relevant Medications   verapamil (CALAN-SR) 120 MG CR tablet   losartan (COZAAR) 100 MG tablet   furosemide (LASIX) 40 MG tablet   atorvastatin (LIPITOR) 40 MG tablet   Other Relevant Orders   Comprehensive metabolic panel   CBC with Differential/Platelet   Lipid Panel w/o Chol/HDL Ratio   Other Visit Diagnoses     Essential hypertension       Relevant Medications   verapamil (CALAN-SR) 120 MG CR tablet   losartan (COZAAR) 100 MG tablet   furosemide (LASIX) 40 MG tablet   atorvastatin (LIPITOR) 40 MG tablet        Follow up plan: Return in about 6 months (around 08/20/2022) for physical.

## 2022-02-18 NOTE — Assessment & Plan Note (Signed)
Pain is worse again. Not feeling better with shots and RFA- would like to see neurosurgery. Referral generated today.

## 2022-02-18 NOTE — Assessment & Plan Note (Signed)
Under good control on current regimen. Continue current regimen. Continue to monitor. Call with any concerns. Refills given.   

## 2022-02-19 ENCOUNTER — Encounter: Payer: Self-pay | Admitting: Family Medicine

## 2022-02-19 DIAGNOSIS — E039 Hypothyroidism, unspecified: Secondary | ICD-10-CM

## 2022-02-19 LAB — CBC WITH DIFFERENTIAL/PLATELET
Basophils Absolute: 0.1 10*3/uL (ref 0.0–0.2)
Basos: 1 %
EOS (ABSOLUTE): 0.2 10*3/uL (ref 0.0–0.4)
Eos: 3 %
Hematocrit: 45.9 % (ref 37.5–51.0)
Hemoglobin: 15.4 g/dL (ref 13.0–17.7)
Immature Grans (Abs): 0.1 10*3/uL (ref 0.0–0.1)
Immature Granulocytes: 1 %
Lymphocytes Absolute: 2.1 10*3/uL (ref 0.7–3.1)
Lymphs: 32 %
MCH: 31 pg (ref 26.6–33.0)
MCHC: 33.6 g/dL (ref 31.5–35.7)
MCV: 93 fL (ref 79–97)
Monocytes Absolute: 0.6 10*3/uL (ref 0.1–0.9)
Monocytes: 8 %
Neutrophils Absolute: 3.7 10*3/uL (ref 1.4–7.0)
Neutrophils: 55 %
Platelets: 243 10*3/uL (ref 150–450)
RBC: 4.96 x10E6/uL (ref 4.14–5.80)
RDW: 12.4 % (ref 11.6–15.4)
WBC: 6.6 10*3/uL (ref 3.4–10.8)

## 2022-02-19 LAB — COMPREHENSIVE METABOLIC PANEL
ALT: 20 IU/L (ref 0–44)
AST: 19 IU/L (ref 0–40)
Albumin/Globulin Ratio: 2 (ref 1.2–2.2)
Albumin: 4.3 g/dL (ref 3.8–4.8)
Alkaline Phosphatase: 119 IU/L (ref 44–121)
BUN/Creatinine Ratio: 11 (ref 10–24)
BUN: 12 mg/dL (ref 8–27)
Bilirubin Total: 0.4 mg/dL (ref 0.0–1.2)
CO2: 23 mmol/L (ref 20–29)
Calcium: 9.9 mg/dL (ref 8.6–10.2)
Chloride: 104 mmol/L (ref 96–106)
Creatinine, Ser: 1.07 mg/dL (ref 0.76–1.27)
Globulin, Total: 2.2 g/dL (ref 1.5–4.5)
Glucose: 80 mg/dL (ref 70–99)
Potassium: 3.7 mmol/L (ref 3.5–5.2)
Sodium: 144 mmol/L (ref 134–144)
Total Protein: 6.5 g/dL (ref 6.0–8.5)
eGFR: 73 mL/min/{1.73_m2} (ref >59)

## 2022-02-19 LAB — LIPID PANEL W/O CHOL/HDL RATIO
Cholesterol, Total: 142 mg/dL (ref 100–199)
HDL: 47 mg/dL (ref >39)
LDL Chol Calc (NIH): 80 mg/dL (ref 0–99)
Triglycerides: 78 mg/dL (ref 0–149)
VLDL Cholesterol Cal: 15 mg/dL (ref 5–40)

## 2022-02-19 LAB — TSH: TSH: 2.11 u[IU]/mL (ref 0.450–4.500)

## 2022-02-19 MED ORDER — LEVOTHYROXINE SODIUM 50 MCG PO TABS: 50.0000 ug | ORAL_TABLET | Freq: Every day | ORAL | 3 refills | Status: DC

## 2022-03-04 NOTE — Telephone Encounter (Signed)
[place]: [id number] exp. 03/04/22-04/18/22

## 2022-03-05 ENCOUNTER — Encounter (HOSPITAL_COMMUNITY)
Admission: RE | Admit: 2022-03-05 | Discharge: 2022-03-05 | Disposition: A | Discharge status: Home / Self Care | Payer: Medicare Other | Source: Ambulatory Visit | Attending: Neurology | Admitting: Neurology

## 2022-03-05 DIAGNOSIS — M25551 Pain in right hip: Secondary | ICD-10-CM | POA: Insufficient documentation

## 2022-03-05 DIAGNOSIS — R498 Other voice and resonance disorders: Secondary | ICD-10-CM | POA: Diagnosis not present

## 2022-03-05 DIAGNOSIS — G20C Parkinsonism, unspecified: Secondary | ICD-10-CM | POA: Insufficient documentation

## 2022-03-05 DIAGNOSIS — Z8616 Personal history of COVID-19: Secondary | ICD-10-CM | POA: Diagnosis not present

## 2022-03-05 DIAGNOSIS — R269 Unspecified abnormalities of gait and mobility: Secondary | ICD-10-CM | POA: Insufficient documentation

## 2022-03-05 DIAGNOSIS — R251 Tremor, unspecified: Secondary | ICD-10-CM | POA: Diagnosis not present

## 2022-03-05 DIAGNOSIS — M25552 Pain in left hip: Secondary | ICD-10-CM | POA: Insufficient documentation

## 2022-03-05 MED ORDER — POTASSIUM IODIDE (ANTIDOTE) 130 MG PO TABS
ORAL_TABLET | ORAL | Status: AC
  Administered 2022-03-05: 130 mg via ORAL
  Filled 2022-03-05: qty 1

## 2022-03-05 MED ORDER — POTASSIUM IODIDE (ANTIDOTE) 130 MG PO TABS: 130.0000 mg | ORAL_TABLET | Freq: Once | ORAL | Status: DC

## 2022-03-05 MED ORDER — IOFLUPANE I 123 185 MBQ/2.5ML IV SOLN
5.0000 | Freq: Once | INTRAVENOUS | Status: AC | PRN
  Administered 2022-03-05: 5 via INTRAVENOUS
  Filled 2022-03-05: qty 5

## 2022-03-07 NOTE — Progress Notes (Signed)
Referring Physician:  Valerie Roys, DO [place],   [id number]  Primary Physician:  Valerie Roys, DO  History of Present Illness: 03/08/2022 [name] has a history of Barrett's esophagus, hyperlipidemia, HTN, hypothyroidism.   He has been seeing PMR ([facility]) and he's had multiple injections for his back pain.   He has intermittent LBP that radiates to right hip right groin and medial thigh x years. LBP > right leg pain. Pain is worse with walking- has trouble shopping and shopping cart helps. Pain is worse by the end of the day. No pain in left leg. History of injury to left leg in 2020 and has weakness.   Difficulty urinating since 2019 - feels pressure, frequency, doesn't feel like he empties bladder completely, no accidents - has been evaluated by urology.   Being evaluated for Parkinson's - waiting on results.   He is a taxidermist. He notes weakness in left hand x 2-3 years. He has balance issues.   Conservative measures:  Physical therapy: did PT at [facility] back in 2018  Multimodal medical therapy including regular antiinflammatories: tylenol, neurontin   Injections:  01/02/2022: Bilateral L4-5 transforaminal ESI (helped for about a week or two) 12/10/2021: Left subacromial injection (good relief) 08/15/2021: Left subacromial injection (good relief for 3 months and then return of pain) 02/26/2021: Left subacromial injection (good relief for approximately 6 months) 04/11/2021: RFA to the bilateral L4-5 and L5-S1 facet joints (good relief for 3 months then moderate continued pain relief) 03/27/2021: MBB to the bilateral L4-5 and L5-S1 facet joints (9/10 to 0/10) 03/06/2021: MBB to the bilateral L4-5 and L5-S1 facet joints (9/10 to 1/10) 02/26/2021: Left subacromial injection (good relief for 6 months) 08/29/2020: Bilateral L4-5 transforaminal ESI (75% improvement) 08/16/2020: Left subacromial injection (immediate improvement of pain and increase in  mobility) 12/03/2017: Bilateral L4-5 transforaminal ESI 01/22/2017: Right L4-5 transforaminal ESI (moderate to good relief) 11/20/2016: Right L4-5 transforaminal ESI 08/16/2016: Right L4-5 transforaminal ESI  Past Surgery: no previous spine surgery  Arthur Richardson has balance issues (being worked up for [employer]) and has weakness in left hand x 2-3 years.   The symptoms are causing a significant impact on the patient's life.   Review of Systems:  A 10 point review of systems is negative, except for the pertinent positives and negatives detailed in the HPI.  Past Medical History: Past Medical History:  Diagnosis Date   Hyperlipidemia    Hypertension     Past Surgical History: Past Surgical History:  Procedure Laterality Date   LITHOTRIPSY  09/1998 & 07/29/1999   Due to nephrolithiasis   PROSTATE SURGERY     Biopsy= Negative   TONSILLECTOMY      Allergies: Allergies as of 03/08/2022 - Review Complete 03/08/2022  Allergen Reaction Noted   Penicillins  10/11/2014    Medications: Outpatient Encounter Medications as of 03/08/2022  Medication Sig   Omega-3 1000 MG CAPS Take 1 capsule by mouth daily.   aspirin EC 81 MG tablet Take 81 mg by mouth daily.   atorvastatin (LIPITOR) 40 MG tablet Take 1 tablet (40 mg total) by mouth daily.   furosemide (LASIX) 40 MG tablet Take 1 tablet (40 mg total) by mouth daily as needed.   levothyroxine (SYNTHROID) 50 MCG tablet Take 1 tablet (50 mcg total) by mouth daily before breakfast.   losartan (COZAAR) 100 MG tablet Take 1 tablet (100 mg total) by mouth daily.   omeprazole (PRILOSEC) 40 MG capsule Take  1 capsule (40 mg total) by mouth daily.   tamsulosin (FLOMAX) 0.4 MG CAPS capsule Take 1 capsule (0.4 mg total) by mouth daily.   verapamil (CALAN-SR) 120 MG CR tablet Take 1 tablet (120 mg total) by mouth at bedtime.   [DISCONTINUED] dutasteride (AVODART) 0.5 MG capsule Take 0.5 mg by mouth daily.   Facility-Administered  Encounter Medications as of 03/08/2022  Medication   potassium iodide (IOSAT) tablet 130 mg    Social History: Social History   Tobacco Use   Smoking status: Former    Types: Cigarettes    Quit date: 01/31/1980    Years since quitting: 42.1    Passive exposure: Past   Smokeless tobacco: Current    Types: Chew  Vaping Use   Vaping Use: Never used  Substance Use Topics   Alcohol use: Yes    Alcohol/week: 14.0 standard drinks of alcohol    Types: 14 Cans of beer per week   Drug use: No    Family Medical History: Family History  Problem Relation Age of Onset   Stroke Father    Cancer Sister        Pancreatic Cancer   Parkinson's disease Neg Hx    Tremor Neg Hx     Physical Examination: Vitals:   03/08/22 0921 03/08/22 0926  BP: (!) 167/97 (!) 160/90  Pulse: 62 62    General: Patient is well developed, well nourished, calm, collected, and in no apparent distress. Attention to examination is appropriate.  Respiratory: Patient is breathing without any difficulty.   NEUROLOGICAL:     Awake, alert, oriented to person, place, and time.  Speech is clear and fluent. Fund of knowledge is appropriate.   Cranial Nerves: Pupils equal round and reactive to light.  Facial tone is symmetric.  Facial sensation is symmetric.  He has mild central lower lumbar tenderness.   No abnormal lesions on exposed skin.   Strength: Side Biceps Triceps Deltoid Interossei Grip Wrist Ext. Wrist Flex.  R '5 5 5 5 5 5 5  '$ L '5 5 5 5 5 5 5   '$ Side Iliopsoas Quads Hamstring PF DF EHL  R '5 5 5 5 5 5  '$ L '5 5 5 4 4 4   '$ Reflexes are 2+ and symmetric at the biceps, triceps, brachioradialis, patella and achilles.   Hoffman's is absent.  Clonus is not present.   Bilateral upper and lower extremity sensation is intact to light touch.     He has a very slow and unsteady gait. Has difficulty turning around.    Medical Decision Making  Imaging: Lumbar MRI dated 08/15/20:  FINDINGS: Segmentation:   Standard.   Alignment:  Normal.   Vertebrae: No fracture or aggressive osseous lesion. Scattered small benign hemangiomata.   Conus medullaris and cauda equina: Conus extends to the L1 level. Conus and cauda equina appear normal.   Disc levels: Multilevel desiccation.   L1-2: No significant disc bulge. Patent spinal canal and neural foramen.   L2-3: Minimal disc bulge. Patent spinal canal and right neural foramen. Mild left neural foraminal narrowing.   L3-4: Disc bulge and bilateral facet degenerative spurring. There is abutment of the exiting right L3 nerve root. Mild spinal canal and bilateral neural foraminal narrowing.   L4-5: Disc bulge with superimposed right foraminal protrusion abutting the exiting right L4 nerve root. Prior extruded fragment at this level is not demonstrated. Bilateral facet degenerative spurring. No significant spinal canal narrowing. Mild bilateral neural foraminal narrowing.  L5-S1: Minimal disc bulge and bilateral facet degenerative spurring. Patent spinal canal and neural foramen.   Paraspinal and other soft tissues: Negative.   IMPRESSION: Right L4-5 foraminal protrusion abutting the exiting L4 nerve root with mild bilateral neural foraminal narrowing.   Mild L3-4 spinal canal narrowing.   Mild left L2-3 and bilateral L3-4 neural foraminal narrowing. Disc bulge abutment of the exiting right L3 nerve root.     Electronically Signed   By: Primitivo Gauze M.D.   On: 08/15/2020 12:06    I have personally reviewed the images and agree with the above interpretation.  Assessment and Plan: Mr. Marti is a pleasant 71 y.o. male with intermittent LBP that radiates to right hip right groin and medial thigh x years. LBP > right leg pain. No pain in left leg. History of injury to left leg in 2020 and has weakness.  Known lumbar spondylosis with disc at L4-L5 abutting L4 nerve, mild central stenosis L3-L4, and bilateral foraminal stenosis  L3-L4 with disc abutting right L3 nerve.   He has unsteady gait that is suspicious for Parkinson's. Seeing neurology for this. He feels weakness in left hand, but has good strength on exam today.   He has urinary issues (see HPI) and is seeing urology.   Treatment options discussed with patient and following plan made:   - Recommend updated lumbar MRI to evaluate right LE radiculopathy. No improvement with time, medications, or injections.  - Continue to follow up with neurology for parkinson's work up.  - Continue to follow up with urology.  - Will set up phone visit to review MRI results once they are back.   BP was elevated, it was 160/90 at the start of his visit. No symptoms of chest pain, shortness of breath, blurry vision, or headaches.   He checks BP at home and it generally runs lower. Will recheck at home and call PCP if not improved. If he develops CP, SOB, blurry vision, or headaches, then he will go to ED.     I spent a total of 30 minutes in face-to-face and non-face-to-face activities related to this patient's care toda including review of outside records, review of imaging, review of symptoms, physical exam, discussion of differential diagnosis, discussion of treatment options, and documentation.   Thank you for involving me in the care of this patient.   [name] PA-C Dept. of Neurosurgery

## 2022-03-08 ENCOUNTER — Ambulatory Visit (INDEPENDENT_AMBULATORY_CARE_PROVIDER_SITE_OTHER): Payer: Medicare Other | Admitting: Orthopedic Surgery

## 2022-03-08 ENCOUNTER — Encounter: Payer: Self-pay | Admitting: Orthopedic Surgery

## 2022-03-08 VITALS — BP 150/86 | HR 62 | Ht 68.0 in | Wt 199.4 lb

## 2022-03-08 DIAGNOSIS — M48061 Spinal stenosis, lumbar region without neurogenic claudication: Secondary | ICD-10-CM | POA: Diagnosis not present

## 2022-03-08 DIAGNOSIS — M47816 Spondylosis without myelopathy or radiculopathy, lumbar region: Secondary | ICD-10-CM

## 2022-03-08 DIAGNOSIS — M4726 Other spondylosis with radiculopathy, lumbar region: Secondary | ICD-10-CM

## 2022-03-08 DIAGNOSIS — M5416 Radiculopathy, lumbar region: Secondary | ICD-10-CM

## 2022-03-08 NOTE — Patient Instructions (Signed)
It was so nice to see you today, I am sorry that you are hurting so much.   Your MRI from last year shows wear and tear in the back (arthritis).   I want to get an updated MRI of your back to look into things further. We will call you to set this up.   Continue to follow up with neurology and urology.   Your blood pressure was elevated today, it was 150/86. I want you to recheck it at home and follow up with your PCP if it remains high. If you have any chest pain, shortness of breath, blurry vision, or headaches then you need to go to ED.   We will set up a phone visit to review your MRI results once they are back.   Please do not hesitate to call if you have any questions or concerns. You can also message me in [place].   If you have not heard back about any of the tests/procedures in the next week, please call the office so we can help you get these things scheduled.   [name] PA-C [phone]

## 2022-03-11 ENCOUNTER — Telehealth: Payer: Self-pay

## 2022-03-11 NOTE — Telephone Encounter (Signed)
-----   Message from Star Age, MD sent at 03/05/2022  4:45 PM EST ----- Please call patient (or wife or designated party on [place]) regarding the recent nuclear medicine DaT scan result. As discussed, this is a specialized brain scan designed to aid with the diagnosis of tremor disorders, including parkinsonian disorders. A radioactive marker gets injected and the uptake is measured in the brain and compared to normal controls and right side of the brain is compared to the left side. A change in uptake can help with diagnosis of certain tremor disorders and narrow down the diagnostic possibilities. The patient's recent scan indicated abnormal (as in lower) uptake as compared to normal uptake pattern indicating an underlying parkinsonian disorder. Keep in mind, this is not a definitive test for Parkinson's disease and does not distinguish between Parkinson's disease and other, atypical parkinsonian disorders. Please offer FU appt to discuss further and to consider treatment options for him.

## 2022-03-11 NOTE — Telephone Encounter (Signed)
I called patient.  I spoke with patient and his wife regarding the DaTscan results and recommendations.  They are agreeable to an appointment on December 6 at 7:45 AM with Dr. Rexene Alberts. Patient and wife verbalized understanding of results and recommendations.

## 2022-03-18 ENCOUNTER — Ambulatory Visit: Payer: Self-pay | Admitting: *Deleted

## 2022-03-18 ENCOUNTER — Ambulatory Visit
Admission: RE | Admit: 2022-03-18 | Discharge: 2022-03-18 | Disposition: A | Discharge status: Home / Self Care | Payer: Medicare Other | Source: Ambulatory Visit | Attending: Orthopedic Surgery | Admitting: Orthopedic Surgery

## 2022-03-18 DIAGNOSIS — M5126 Other intervertebral disc displacement, lumbar region: Secondary | ICD-10-CM | POA: Diagnosis not present

## 2022-03-18 DIAGNOSIS — M47816 Spondylosis without myelopathy or radiculopathy, lumbar region: Secondary | ICD-10-CM | POA: Insufficient documentation

## 2022-03-18 DIAGNOSIS — M48061 Spinal stenosis, lumbar region without neurogenic claudication: Secondary | ICD-10-CM | POA: Diagnosis not present

## 2022-03-18 DIAGNOSIS — M5416 Radiculopathy, lumbar region: Secondary | ICD-10-CM | POA: Insufficient documentation

## 2022-03-18 NOTE — Telephone Encounter (Signed)
Message from Scherrie Gerlach sent at 03/18/2022  1:33 PM EST  Summary: bp issues   Pt concerned his blood pressure has been a little elevated after a current radiation treatment.  150/86, and 152/73.  Pt declined to see any other provider.  Pt states they are testing him for parkinsons, and the bp issues just started. Pt has long term effects from covid 2 yrs ago and has a hard time talking.          Call History   Type Contact Phone/Fax User  03/18/2022 01:19 PM EST Phone (Incoming) [place], [place] (Self) [phone] Jerilynn Mages) Scherrie Gerlach

## 2022-03-18 NOTE — Telephone Encounter (Signed)
2nd attempt, pt called, unable to leave VM d/t mailbox full.  Summary: bp issues   Pt concerned his blood pressure has been a little elevated after a current radiation treatment.  150/86, and 152/73.  Pt declined to see any other provider.  Pt states they are testing him for parkinsons, and the bp issues just started. Pt has long term effects from covid 2 yrs ago and has a hard time talking.

## 2022-03-18 NOTE — Telephone Encounter (Signed)
Third attempt to return his call without success.  Per policy I have forwarded his chart to [facility].

## 2022-03-18 NOTE — Telephone Encounter (Signed)
Attempted to return his call.   Unable to leave a voicemail due to the mailbox being full.   Will try again later.

## 2022-03-25 ENCOUNTER — Encounter: Payer: Self-pay | Admitting: Orthopedic Surgery

## 2022-03-25 ENCOUNTER — Ambulatory Visit (INDEPENDENT_AMBULATORY_CARE_PROVIDER_SITE_OTHER): Payer: Medicare Other | Admitting: Orthopedic Surgery

## 2022-03-25 DIAGNOSIS — M47816 Spondylosis without myelopathy or radiculopathy, lumbar region: Secondary | ICD-10-CM

## 2022-03-25 DIAGNOSIS — M48061 Spinal stenosis, lumbar region without neurogenic claudication: Secondary | ICD-10-CM

## 2022-03-25 DIAGNOSIS — M4726 Other spondylosis with radiculopathy, lumbar region: Secondary | ICD-10-CM | POA: Diagnosis not present

## 2022-03-25 DIAGNOSIS — M5416 Radiculopathy, lumbar region: Secondary | ICD-10-CM

## 2022-03-25 NOTE — Progress Notes (Signed)
Telephone Visit- Progress Note: Referring Physician:  Valerie Roys, DO [place],  [place] [id number]  Primary Physician:  Valerie Roys, DO  This visit was performed via telephone.  Patient location: home Provider location: office  I spent a total of 20 minutes non-face-to-face activities for this visit on the date of this encounter including review of current clinical condition and response to treatment.   Chief Complaint:  review lumbar MRI results  History of Present Illness: Arthur Richardson is a 71 y.o. male who was last seen by me on 03/08/22. He has intermittent LBP that radiates to right hip right groin and medial thigh x years. LBP > right leg pain. No pain in left leg. History of injury to left leg in 2020 and has weakness.   Known lumbar spondylosis with disc at L4-L5 abutting L4 nerve, mild central stenosis L3-L4, and bilateral foraminal stenosis L3-L4 with disc abutting right L3 nerve.    He has unsteady gait that is suspicious for Parkinson's. Seeing neurology for this. Has follow up on 04/03/22.    He has urinary issues (see HPI) and is seeing urology.   He continues with above symptoms. No significant change.    Conservative measures:  Physical therapy: did PT at [facility] back in 2018  Multimodal medical therapy including regular antiinflammatories: tylenol, neurontin   Injections:  01/02/2022: Bilateral L4-5 transforaminal ESI (helped for about a week or two) 12/10/2021: Left subacromial injection (good relief) 08/15/2021: Left subacromial injection (good relief for 3 months and then return of pain) 02/26/2021: Left subacromial injection (good relief for approximately 6 months) 04/11/2021: RFA to the bilateral L4-5 and L5-S1 facet joints (good relief for 3 months then moderate continued pain relief) 03/27/2021: MBB to the bilateral L4-5 and L5-S1 facet joints (9/10 to 0/10) 03/06/2021: MBB to the bilateral L4-5 and L5-S1 facet joints (9/10 to  1/10) 02/26/2021: Left subacromial injection (good relief for 6 months) 08/29/2020: Bilateral L4-5 transforaminal ESI (75% improvement) 08/16/2020: Left subacromial injection (immediate improvement of pain and increase in mobility) 12/03/2017: Bilateral L4-5 transforaminal ESI 01/22/2017: Right L4-5 transforaminal ESI (moderate to good relief) 11/20/2016: Right L4-5 transforaminal ESI 08/16/2016: Right L4-5 transforaminal ESI   Past Surgery: no previous spine surgery  Exam: No exam done as this was a telephone encounter.     Imaging: Lumbar MRI dated 03/18/22:  FINDINGS: Segmentation: Conventional anatomy assumed, with the last open disc space designated L5-S1.Concordant with prior imaging.   Alignment:  Physiologic.   Vertebrae: No worrisome osseous lesion, acute fracture or pars defect. Congenitally short lumbar pedicles. The visualized sacroiliac joints appear unremarkable.   Conus medullaris: Extends to the L1 level and appears normal.   Paraspinal and other soft tissues: No significant paraspinal findings.   Disc levels:   L1-2: No significant findings.   L2-3: Disc height and hydration are maintained. Stable minimal disc bulging and facet hypertrophy. No spinal stenosis or nerve root encroachment.   L3-4: Stable mild loss of disc height with annular disc bulging eccentric to the right. Mild facet and ligamentous hypertrophy. Stable borderline spinal stenosis with mild lateral recess and foraminal narrowing bilaterally. No definite nerve root encroachment.   L4-5: Stable disc bulging and endplate osteophytes asymmetric to the right. Mild facet and ligamentous hypertrophy. Stable mild lateral recess and foraminal narrowing bilaterally. No recurrent disc extrusion or definite nerve root encroachment.   L5-S1: Disc height and hydration are maintained. Stable minimal disc bulging and facet hypertrophy. No spinal stenosis or  nerve root encroachment.    IMPRESSION: 1. No acute findings or explanation for the patient's symptoms. No significant change from previous study 08/15/2020. 2. Stable mild spondylosis, greatest at L3-4 and L4-5. At L3-4, there is borderline spinal stenosis and mild lateral recess and foraminal narrowing bilaterally. At L4-5, there is mild lateral recess and foraminal narrowing bilaterally. No definite nerve root encroachment.     Electronically Signed   By: Richardean Sale M.D.   On: 03/19/2022 08:49  I have personally reviewed the images and agree with the above interpretation.  Assessment and Plan: Mr. Hiller is a pleasant 71 y.o. male with intermittent LBP that radiates to right hip right groin and medial thigh x years. LBP > right leg pain. No pain in left leg. History of injury to left leg in 2020 and has weakness.  Above MRI shows lumbar spondylosis with mild central and foraminal stenosis L3-L4 along with mild lateral recess/bilateral foraminal stenosis L4-L5. Right leg pain likely from L3-L4. Back pain is likely multifactorial.   Treatment options discussed with patient and following plan made:   - Recommend PT for lumbar spine. See below.  - No relief with previous lumbar injections.  - He is currently in workup for Parkinson's. Has follow up with neurology on 04/03/22. He would like to hold on further treatment for his lumbar spine for now.  - Will do phone visit with me after the first of the year to regroup.  - Will review MRI with Dr. Izora Ribas to see if he is a candidate for surgery.   [name] PA-C Neurosurgery

## 2022-04-01 ENCOUNTER — Encounter: Payer: Self-pay | Admitting: Family Medicine

## 2022-04-01 ENCOUNTER — Ambulatory Visit (INDEPENDENT_AMBULATORY_CARE_PROVIDER_SITE_OTHER): Payer: Medicare Other | Admitting: Family Medicine

## 2022-04-01 VITALS — BP 129/79 | HR 80 | Temp 98.7°F | Ht 68.0 in | Wt 197.2 lb

## 2022-04-01 DIAGNOSIS — N138 Other obstructive and reflux uropathy: Secondary | ICD-10-CM | POA: Diagnosis not present

## 2022-04-01 DIAGNOSIS — G44029 Chronic cluster headache, not intractable: Secondary | ICD-10-CM | POA: Diagnosis not present

## 2022-04-01 DIAGNOSIS — K209 Esophagitis, unspecified without bleeding: Secondary | ICD-10-CM | POA: Diagnosis not present

## 2022-04-01 DIAGNOSIS — I10 Essential (primary) hypertension: Secondary | ICD-10-CM | POA: Diagnosis not present

## 2022-04-01 DIAGNOSIS — R251 Tremor, unspecified: Secondary | ICD-10-CM | POA: Diagnosis not present

## 2022-04-01 DIAGNOSIS — N401 Enlarged prostate with lower urinary tract symptoms: Secondary | ICD-10-CM | POA: Diagnosis not present

## 2022-04-01 DIAGNOSIS — K2270 Barrett's esophagus without dysplasia: Secondary | ICD-10-CM

## 2022-04-01 NOTE — Assessment & Plan Note (Signed)
BP running good today. Will not change any medicine. Continue to monitor.

## 2022-04-01 NOTE — Assessment & Plan Note (Signed)
Unhappy with his urologist. Would like a 2nd opinion. Referral placed today.

## 2022-04-01 NOTE — Assessment & Plan Note (Signed)
Refuses to continue omeprazole. Will hold for now. Continue to monitor.

## 2022-04-01 NOTE — Progress Notes (Signed)
BP 129/79   Pulse 80   Temp 98.7 F (37.1 C) (Oral)   Ht _0  (1.727 m)   Wt 197 lb 3.2 oz (89.4 kg)   SpO2 96%   BMI 29.98 kg/m    Subjective:    Patient ID: Arthur Richardson, male    DOB: 1950-09-27, 71 y.o.   MRN: [id number]  HPI: Arthur Richardson is a 71 y.o. male  Chief Complaint  Patient presents with   Hypertension    Patient says they some testing and had radiation ran through him and his blood pressure  was elevated at that visit. Patient says his blood pressure has been up and down since them. Patient wife is concerned the radiation may still be in his system as they noticed it after the procedure.    Gastroesophageal Reflux   HYPERTENSION  Hypertension status: stable  Satisfied with current treatment? yes Duration of hypertension: chronic BP monitoring frequency:  a few times a week BP medication side effects:  no Medication compliance: good compliance Previous BP meds:lasix, verapamil, losartan Aspirin: yes Recurrent headaches: no Visual changes: no Palpitations: no Dyspnea: no Chest pain: no Lower extremity edema: no Dizzy/lightheaded: no  BPH BPH status: stable Satisfied with current treatment?: no Medication side effects: no Medication compliance: excellent compliance Duration: chronic Nocturia: 2-3x per night Urinary frequency:yes Incomplete voiding: yes Urgency: yes Weak urinary stream: yes Straining to start stream: yes Dysuria: no Onset: gradual Severity: moderate   Relevant past medical, surgical, family and social history reviewed and updated as indicated. Interim medical history since our last visit reviewed. Allergies and medications reviewed and updated.  Review of Systems  Constitutional: Negative.   Respiratory: Negative.    Cardiovascular: Negative.   Musculoskeletal:  Positive for myalgias. Negative for arthralgias, back pain, gait problem, joint swelling, neck pain and neck stiffness.  Skin: Negative.    Neurological: Negative.   Psychiatric/Behavioral: Negative.      Per HPI unless specifically indicated above     Objective:    BP 129/79   Pulse 80   Temp 98.7 F (37.1 C) (Oral)   Ht _1  (1.727 m)   Wt 197 lb 3.2 oz (89.4 kg)   SpO2 96%   BMI 29.98 kg/m   Wt Readings from Last 3 Encounters:  04/01/22 197 lb 3.2 oz (89.4 kg)  03/08/22 199 lb 6.4 oz (90.4 kg)  02/18/22 200 lb 9.6 oz (91 kg)    Physical Exam Vitals and nursing note reviewed.  Constitutional:      General: He is not in acute distress.    Appearance: Normal appearance. He is not ill-appearing, toxic-appearing or diaphoretic.  HENT:     Head: Normocephalic and atraumatic.     Right Ear: External ear normal.     Left Ear: External ear normal.     Nose: Nose normal.     Mouth/Throat:     Mouth: Mucous membranes are moist.     Pharynx: Oropharynx is clear.  Eyes:     General: No scleral icterus.       Right eye: No discharge.        Left eye: No discharge.     Extraocular Movements: Extraocular movements intact.     Conjunctiva/sclera: Conjunctivae normal.     Pupils: Pupils are equal, round, and reactive to light.  Cardiovascular:     Rate and Rhythm: Normal rate and regular rhythm.     Pulses: Normal pulses.     Heart  sounds: Normal heart sounds. No murmur heard.    No friction rub. No gallop.  Pulmonary:     Effort: Pulmonary effort is normal. No respiratory distress.     Breath sounds: Normal breath sounds. No stridor. No wheezing, rhonchi or rales.  Chest:     Chest wall: No tenderness.  Musculoskeletal:        General: Normal range of motion.     Cervical back: Normal range of motion and neck supple.  Skin:    General: Skin is warm and dry.     Capillary Refill: Capillary refill takes less than 2 seconds.     Coloration: Skin is not jaundiced or pale.     Findings: No bruising, erythema, lesion or rash.  Neurological:     General: No focal deficit present.     Mental Status: He is  alert and oriented to person, place, and time. Mental status is at baseline.  Psychiatric:        Mood and Affect: Mood normal.        Behavior: Behavior normal.        Thought Content: Thought content normal.        Judgment: Judgment normal.     Results for orders placed or performed in visit on 02/18/22  Comprehensive metabolic panel  Result Value Ref Range   Glucose 80 70 - 99 mg/dL   BUN 12 8 - 27 mg/dL   Creatinine, Ser 1.07 0.76 - 1.27 mg/dL   eGFR 73 >59 mL/min/1.73   BUN/Creatinine Ratio 11 10 - 24   Sodium 144 134 - 144 mmol/L   Potassium 3.7 3.5 - 5.2 mmol/L   Chloride 104 96 - 106 mmol/L   CO2 23 20 - 29 mmol/L   Calcium 9.9 8.6 - 10.2 mg/dL   Total Protein 6.5 6.0 - 8.5 g/dL   Albumin 4.3 3.8 - 4.8 g/dL   Globulin, Total 2.2 1.5 - 4.5 g/dL   Albumin/Globulin Ratio 2.0 1.2 - 2.2   Bilirubin Total 0.4 0.0 - 1.2 mg/dL   Alkaline Phosphatase 119 44 - 121 IU/L   AST 19 0 - 40 IU/L   ALT 20 0 - 44 IU/L  CBC with Differential/Platelet  Result Value Ref Range   WBC 6.6 3.4 - 10.8 x10E3/uL   RBC 4.96 4.14 - 5.80 x10E6/uL   Hemoglobin 15.4 13.0 - 17.7 g/dL   Hematocrit 45.9 37.5 - 51.0 %   MCV 93 79 - 97 fL   MCH 31.0 26.6 - 33.0 pg   MCHC 33.6 31.5 - 35.7 g/dL   RDW 12.4 11.6 - 15.4 %   Platelets 243 150 - 450 x10E3/uL   Neutrophils 55 Not Estab. %   Lymphs 32 Not Estab. %   Monocytes 8 Not Estab. %   Eos 3 Not Estab. %   Basos 1 Not Estab. %   Neutrophils Absolute 3.7 1.4 - 7.0 x10E3/uL   Lymphocytes Absolute 2.1 0.7 - 3.1 x10E3/uL   Monocytes Absolute 0.6 0.1 - 0.9 x10E3/uL   EOS (ABSOLUTE) 0.2 0.0 - 0.4 x10E3/uL   Basophils Absolute 0.1 0.0 - 0.2 x10E3/uL   Immature Granulocytes 1 Not Estab. %   Immature Grans (Abs) 0.1 0.0 - 0.1 x10E3/uL  Lipid Panel w/o Chol/HDL Ratio  Result Value Ref Range   Cholesterol, Total 142 100 - 199 mg/dL   Triglycerides 78 0 - 149 mg/dL   HDL 47 >39 mg/dL   VLDL Cholesterol Cal 15 5 - 40 mg/dL  LDL Chol Calc (NIH) 80 0 -  99 mg/dL  TSH  Result Value Ref Range   TSH 2.110 0.450 - 4.500 uIU/mL      Assessment & Plan:   Problem List Items Addressed This Visit       Cardiovascular and Mediastinum   Hypertension    BP running good today. Will not change any medicine. Continue to monitor.         Digestive   Barrett's esophagus with esophagitis    Refuses to continue omeprazole. Will hold for now. Continue to monitor.       Relevant Orders   Magnesium     Nervous and Auditory   Chronic cluster headache, not intractable    Cluster headaches resolved on verapamil. Advised him not to stop it.         Genitourinary   BPH with obstruction/lower urinary tract symptoms - Primary    Unhappy with his urologist. Would like a 2nd opinion. Referral placed today.      Relevant Orders   Ambulatory referral to Urology   Other Visit Diagnoses     Tremor       Concern for parkinsonism. To see neurology on Wednesday to go over results.        Follow up plan: Return in about 3 months (around 07/01/2022).

## 2022-04-01 NOTE — Assessment & Plan Note (Signed)
Cluster headaches resolved on verapamil. Advised him not to stop it.

## 2022-04-02 LAB — MAGNESIUM: Magnesium: 2.6 mg/dL — ABNORMAL HIGH (ref 1.6–2.3)

## 2022-04-03 ENCOUNTER — Ambulatory Visit: Payer: Medicare Other | Admitting: Neurology

## 2022-04-03 ENCOUNTER — Encounter: Payer: Self-pay | Admitting: Neurology

## 2022-04-03 VITALS — BP 126/78 | HR 67 | Ht 68.0 in | Wt 201.0 lb

## 2022-04-03 DIAGNOSIS — G20C Parkinsonism, unspecified: Secondary | ICD-10-CM

## 2022-04-03 DIAGNOSIS — M545 Low back pain, unspecified: Secondary | ICD-10-CM

## 2022-04-03 DIAGNOSIS — R498 Other voice and resonance disorders: Secondary | ICD-10-CM

## 2022-04-03 MED ORDER — CARBIDOPA-LEVODOPA 25-100 MG PO TABS: 1.0000 | ORAL_TABLET | Freq: Three times a day (TID) | ORAL | 3 refills | Status: DC

## 2022-04-03 NOTE — Progress Notes (Signed)
Subjective:    Patient ID: Arthur Richardson is a 71 y.o. male.  HPI    Interim history:   [name] is a 71 year old gentleman with an underlying medical history of hypertension, hyperlipidemia, hypothyroidism, and borderline obesity, who presents for follow-up consultation of his tremor disorder, concern for parkinsonism, after his DaT scan. The patient is accompanied by his wife today.  I first met them at the request of his primary care physician on 01/31/2022, at which time the patient reported an approximately 1 year history of tremors affecting both upper extremities, particularly the left side.  Examination findings were concerning for left-sided parkinsonism.  We talked about lifestyle modification, he was advised to hydrate well with water, limit his alcohol intake and reduce his caffeine intake.  He was counseled on complete smoking cessation.  We talked about the importance of physical activity and getting enough rest.  He was advised to proceed with a DaTscan.  He had a nuclear medicine brain DaTscan on 03/05/2022 and I reviewed the results: IMPRESSION: Mark asymmetric decreased radiotracer activity in the RIGHT striatum is a finding typical of Parkinsonian syndrome pathology.   Of note, DaTSCAN is not diagnostic of Parkinsonian syndromes, which remains a clinical diagnosis. DaTscan is an adjuvant test to aid in the clinical diagnosis of Parkinsonian syndromes.   Today, 04/03/22: He reports feeling stable, he has had no falls, he has low back pain, sometimes it radiates to the right leg.  He is not considering acupuncture.  He tries to hydrate well with water.  He does not drink alcohol daily.  He tries to stay active.  He is agreeable to starting medication for Parkinson's disease.  We talked about his DaTscan results at length and their questions were answered.  He has seen neurosurgery for low back pain and had a scan done, he is supposed to start physical therapy.  The  patient's allergies, current medications, family history, past medical history, past social history, past surgical history and problem list were reviewed and updated as appropriate.   Previously:   01/31/22: (He) reports an approximately 1+ year history of left more than right upper extremity tremors, difficulty with dexterity, left more than right, difficulty walking, weakness on the left side, difficulty with his speech, occasional difficulty swallowing.  He reports that symptoms started a few months after he was diagnosed with COVID in 2022.  He had COVID per patient and wife in January 2022, he did not go to the hospital or emergency room, he did not get any antiviral treatment.  He stayed in bed, he felt that he had pneumonia but did not actually get checked out at the time.  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  He is right-handed.  His wife helps him in his taxidermy business and their son helps out but he has another job and works overnight.  They are 88 year old son lives with them.  He has no family history of Parkinson's disease.  He quit smoking in the early 80s but used to tobacco every day.  He does not drink much in the way of water, maybe less than 1 bottle per day, he likes to drink chair wine, about 2 bottles per day or diet [facility].  He does drink alcohol daily in the form of beer, 1 or 2/day.  He reports a remote history of right-sided Bell's palsy in 2004 or 2005.  He has chronic low back pain and had nerve ablation as I understand.  He also had epidural injections.  He has bilateral hip pain and has received a left hip injection.   I reviewed your office note from 11/15/2021.  He had a TSH checked at the time, it was normal at 1.55.   He has previously been followed by Dr. Melrose Nakayama in neurology at [facility] clinic I was able to review office visit dates but not the encounter note, last seen  on 10/11/2020. It looks like he had an EMG and nerve conduction velocity test at the time.  He had an office visit with Dr. Melrose Nakayama on 09/21/2020 and I was able to review the office note.  Patient was on gabapentin at the time and reported weakness in the left arm and leg.  Reportedly he had a brain MRI on 09/05/2020 and I reviewed the result in Dr. Lannie Fields note: Impression: No evidence of acute intracranial abnormality.  Mild cerebral atrophy and chronic small vessel ischemic disease.  Paranasal sinus disease, as described.     He also had an MRI of the lumbar spine on 08/15/2020 with impression: Right L4-5 foraminal protrusion abutting the exiting L4 nerve root with mild bilateral neural foraminal narrowing.  Mild L3-4 spinal canal narrowing.  Mild left L2-3 and bilateral L3-4 neuroforaminal narrowing.  Disc bulge abutment of the exiting right L3 nerve root.     He had an MRI of the left shoulder on 08/15/2020 and I reviewed the results: IMPRESSION:  Mild-to-moderate appearing rotator cuff tendinopathy without tear.    Moderate acromioclavicular osteoarthritis.    Small volume of subacromial/subdeltoid fluid compatible with  bursitis.    His Past Medical History Is Significant For: Past Medical History:  Diagnosis Date   Hyperlipidemia    Hypertension     His Past Surgical History Is Significant For: Past Surgical History:  Procedure Laterality Date   LITHOTRIPSY  09/1998 & 07/29/1999   Due to nephrolithiasis   PROSTATE SURGERY     Biopsy= Negative   TONSILLECTOMY      His Family History Is Significant For: Family History  Problem Relation Age of Onset   Stroke Father    Cancer Sister        Pancreatic Cancer   Parkinson's disease Neg Hx    Tremor Neg Hx     His Social History Is Significant For: Social History   Socioeconomic History   Marital status: Married    Spouse name: Not on file   Number of children: Not on file   Years of education: Not on file   Highest  education level: Not on file  Occupational History   Not on file  Tobacco Use   Smoking status: Former    Types: Cigarettes    Quit date: 01/31/1980    Years since quitting: 42.2    Passive exposure: Past   Smokeless tobacco: Current    Types: Chew  Vaping Use   Vaping Use: Never used  Substance and Sexual Activity   Alcohol use: Yes    Alcohol/week: 14.0 standard drinks of alcohol    Types: 14 Cans of beer per week   Drug use: No   Sexual activity: Yes  Other Topics Concern   Not on file  Social History Narrative   Not on file   Social Determinants of Health   Financial Resource Strain: Low Risk  (04/09/2021)   Overall Financial Resource Strain (CARDIA)    Difficulty of Paying Living  Expenses: Not hard at all  Food Insecurity: No Food Insecurity (04/09/2021)   Hunger Vital Sign    Worried About Running Out of Food in the Last Year: Never true    Ran Out of Food in the Last Year: Never true  Transportation Needs: No Transportation Needs (04/09/2021)   PRAPARE - Hydrologist (Medical): No    Lack of Transportation (Non-Medical): No  Physical Activity: Insufficiently Active (04/09/2021)   Exercise Vital Sign    Days of Exercise per Week: 3 days    Minutes of Exercise per Session: 40 min  Stress: No Stress Concern Present (04/09/2021)   [place]    Feeling of Stress : Not at all  Social Connections: Moderately Integrated (04/09/2021)   Social Connection and Isolation Panel [NHANES]    Frequency of Communication with Friends and Family: More than three times a week    Frequency of Social Gatherings with Friends and Family: More than three times a week    Attends Religious Services: More than 4 times per year    Active Member of [employer] or Organizations: No    Attends Archivist Meetings: Never    Marital Status: Married    His Allergies Are:  Allergies  Allergen  Reactions   Penicillins   :   His Current Medications Are:  Outpatient Encounter Medications as of 04/03/2022  Medication Sig   aspirin EC 81 MG tablet Take 81 mg by mouth daily.   atorvastatin (LIPITOR) 40 MG tablet Take 1 tablet (40 mg total) by mouth daily.   carbidopa-levodopa (SINEMET IR) 25-100 MG tablet Take 1 tablet by mouth 3 (three) times daily. Follow instructions provided separately in writing.   furosemide (LASIX) 40 MG tablet Take 1 tablet (40 mg total) by mouth daily as needed.   levothyroxine (SYNTHROID) 50 MCG tablet Take 1 tablet (50 mcg total) by mouth daily before breakfast.   losartan (COZAAR) 100 MG tablet Take 1 tablet (100 mg total) by mouth daily.   Omega-3 1000 MG CAPS Take 1 capsule by mouth daily.   tamsulosin (FLOMAX) 0.4 MG CAPS capsule Take 1 capsule (0.4 mg total) by mouth daily.   verapamil (CALAN-SR) 120 MG CR tablet Take 1 tablet (120 mg total) by mouth at bedtime.   No facility-administered encounter medications on file as of 04/03/2022.  :  Review of Systems:  Out of a complete 14 point review of systems, all are reviewed and negative with the exception of these symptoms as listed below:   Review of Systems  Neurological:        Pt here to f/u from  [facility] .     Objective:  Neurological Exam  Physical Exam Physical Examination:   Vitals:   04/03/22 0756  BP: 126/78  Pulse: 67    General Examination: The patient is a very pleasant 71 y.o. male in no acute distress. He appears well-developed and well-nourished and well groomed.   HEENT: Normocephalic, atraumatic, pupils are equal, round and reactive to light, extraocular tracking is mildly impaired, corrective eyeglasses in place.  Hearing is grossly intact.  Face is very slightly asymmetric, mild facial masking noted, mild to moderate nuchal rigidity noted, no facial tremor, no neck tremor.  He has moderate to severe hypophonia with fluctuating voice and raspiness noted, slight dysarthria  at times.  He has no voice tremor. Neck with decreased passive range of motion.  No carotid bruits.  Mild to moderate mouth dryness noted, tongue protrudes centrally.    Chest: Clear to auscultation without wheezing, rhonchi or crackles noted.   Heart: S1+S2+0, regular and normal without murmurs, rubs or gallops noted.    Abdomen: Soft, non-tender and non-distended.   Extremities: There is no pitting edema in the distal lower extremities bilaterally.    Skin: Warm and dry without trophic changes noted.    Musculoskeletal: exam reveals no obvious joint deformities but reports low back pain.    Neurologically:  Mental status: The patient is awake, alert and oriented in all 4 spheres. His immediate and remote memory, attention, language skills and fund of knowledge are appropriate. There is no evidence of aphasia, agnosia, apraxia or anomia. Speech as above.  Thought process is linear. Mood is normal and affect is normal.  Cranial nerves II - XII are as described above under HEENT exam.  Motor exam: Normal bulk, global strength normal, increased tone in both upper extremities with left upper extremity cogwheeling noted, no telltale resting tremor.    (On 01/31/22: On Archimedes spiral drawing he has minimal trembling with the right hand, moderate trembling with the left hand, handwriting with the right hand is slightly tremulous, legible, not particularly micrographic but on the smaller side at the end.)     He has an upper extremity postural tremor, no significant action tremor in either hand or arm and no intention tremor, no lower extremity tremor.     Fine motor skills and coordination: He has moderate difficulty with the left upper and lower extremities, better on the right.   Cerebellar testing: No dysmetria or intention tremor. There is no truncal or gait ataxia.  Finger-to-nose is doable without dysmetria but he has difficulty with heel-to-shin bilaterally due to hip pain  bilaterally. Sensory exam: intact to light touch in the upper and lower extremities.    Gait, station and balance: He stands with difficulty and pushes himself up.  Posture is stooped for age.  He walks with decreased stride length and pace, no telltale shuffling, decreased arm swing bilaterally, left more than right and slight difficulty with turns, no walking aid.  Mild start hesitation.  Mild stutter steps with turns.   Assessment and Plan:    In summary, Arthur Richardson is a very pleasant 71 year old male with an underlying medical history of hypertension, hyperlipidemia, hypothyroidism, and borderline obesity, who presents for follow-up consultation of his parkinsonism of at least 15 months duration.  History and examination findings are in keeping with left-sided parkinsonism.  His DaTscan from 03/05/2022 showed asymmetric decreased radiotracer activity, with significantly decreased uptake on the right, and, as such is supportive of an underlying parkinsonian syndrome.  We talked about this at length today, all their questions were answered.  We talked about the diagnosis of Parkinson's disease, its prognosis and treatment options.  We again talked about the importance of maintaining a healthy lifestyle and staying active, well-hydrated, avoid excessive caffeine, avoid daily alcohol.  He is agreeable to starting generic Sinemet 25-100 mg strength half a pill twice daily with gradual titration to up to 1 pill 3 times daily over the next 3 weeks.  We talked about expectations, limitations, and potential common side effects, he was given detailed written instructions as well.  I sent a prescription to his pharmacy.  He is advised to follow-up routinely in this clinic in about 3 to 4 months, sooner if needed.  He indicates that he would eventually like to see a  neurologist closer to home which I completely understand.  Until then, he is advised to follow-up with [place] regularly.  He is supposed to start  physical therapy. I answered all their questions today and the patient and his wife were in agreement. I spent 40 minutes in total face-to-face time and in reviewing records during pre-charting, more than 50% of which was spent in counseling and coordination of care, reviewing test results, reviewing medications and treatment regimen and/or in discussing or reviewing the diagnosis of PD, the prognosis and treatment options. Pertinent laboratory and imaging test results that were available during this visit with the patient were reviewed by me and considered in my medical decision making (see chart for details).

## 2022-04-03 NOTE — Patient Instructions (Signed)
It was nice to see you again today. As discussed, we will start you on a new medication for Parkinson's disease, called Sinemet (generic name: carbidopa-levodopa) 25/100 mg: Take half a pill twice daily (8 AM and noon) for one week, then half a pill 3 times a day (8 AM, noon, and 4 PM) for one week, then one pill 3 times a day thereafter. Please try to take the medication away from you mealtimes, that is, ideally either one hour before or 2 hours after your meal to ensure optimal absorption. The medication can interfere with the protein content of your meal and trying to the protein in your food and therefore not get fully absorbed.  Common side effects reported are: Nausea, vomiting, sedation, confusion, lightheadedness. Rare side effects include hallucinations, severe nausea or vomiting, diarrhea and significant drop in blood pressure especially when going from lying to standing or from sitting to standing.

## 2022-04-05 ENCOUNTER — Telehealth: Payer: Self-pay | Admitting: Family Medicine

## 2022-04-05 NOTE — Telephone Encounter (Signed)
Copied from [place] [id number]. Topic: Test Results - Chart Correction >> Apr 05, 2022 12:10 PM Josephina Gip wrote: Reason for CRM:  Patient would like someone to call him today in regards to his labs that were drawn on 04/01/22 (magnesium).  I had called patient to schedule AWV and he requested a clinical team member to call him  back in regards to magnesium levels that were check on 04/01/22.

## 2022-04-05 NOTE — Telephone Encounter (Signed)
Patient would like someone to call him today in regards to his labs that were drawn on 04/01/22 (magnesium).   I had called patient to schedule AWV and he requested a clinical team member to call him  back in regards to magnesium levels that were check on 04/01/22.

## 2022-04-16 ENCOUNTER — Telehealth: Payer: Self-pay | Admitting: *Deleted

## 2022-04-16 NOTE — Telephone Encounter (Signed)
Going thru your schedule for tomorrow and this pt has received a 2nd referral when he's already your patient. Called pt to see if anything has changed and if he needed to be seen tomorrow instead of his 1 yr follow-up in march. Pt states he's still wetting the depends at night and just frustrated with his all.pt has been diagnosed with parkinson and on sinemet. Pt will cancel appt tomorrow and be seen in march 2024, per pt is there anything else he can try?

## 2022-04-16 NOTE — Telephone Encounter (Signed)
Spoke with patient and wife and they will stop by to pick up samples, rescheduled appt

## 2022-04-17 ENCOUNTER — Ambulatory Visit: Payer: Medicare Other | Admitting: Urology

## 2022-05-03 NOTE — Progress Notes (Unsigned)
Telephone Visit- Progress Note: Referring Physician:  Valerie Roys, DO [place],  [place] [address]  Primary Physician:  Arthur Roys, DO  This visit was performed via telephone.  Patient location: home Provider location: office  I spent a total of *** minutes non-face-to-face activities for this visit on the date of this encounter including review of current clinical condition and response to treatment.  Patient has given verbal consent to this telephone visits and we reviewed the limitations of a telephone visit. Patient wishes to proceed.    Chief Complaint:  recheck after lumbar PT  History of Present Illness: Arthur Richardson is a 72 y.o. male who was last had a phone visit with me on 03/25/22.   He has known lumbar spondylosis with mild central and foraminal stenosis L3-L4 along with mild lateral recess/bilateral foraminal stenosis L4-L5. Right leg pain likely from L3-L4. Back pain is likely multifactorial.    PT was recommended. He was in middle of work up for Parkinson's and wanted to hold on this until after the first of the year.      Dr. Izora Richardson does not recommend any surgery for his lumbar spine. Can consider referral to ortho and/or referral for pain management (peripheral nerve stimulator?). ***   seen by me on 03/08/22. He has intermittent LBP that radiates to right hip right groin and medial thigh x years. LBP > right leg pain. No pain in left leg. History of injury to left leg in 2020 and has weakness.   Known lumbar spondylosis with disc at L4-L5 abutting L4 nerve, mild central stenosis L3-L4, and bilateral foraminal stenosis L3-L4 with disc abutting right L3 nerve.    He has unsteady gait that is suspicious for Parkinson's. Seeing neurology for this. Has follow up on 04/03/22.    He has urinary issues (see HPI) and is seeing urology.   He continues with above symptoms. No significant change.    Conservative measures:  Physical  therapy: did PT at [facility] back in 2018  Multimodal medical therapy including regular antiinflammatories: tylenol, neurontin   Injections:  01/02/2022: Bilateral L4-5 transforaminal ESI (helped for about a week or two) 12/10/2021: Left subacromial injection (good relief) 08/15/2021: Left subacromial injection (good relief for 3 months and then return of pain) 02/26/2021: Left subacromial injection (good relief for approximately 6 months) 04/11/2021: RFA to the bilateral L4-5 and L5-S1 facet joints (good relief for 3 months then moderate continued pain relief) 03/27/2021: MBB to the bilateral L4-5 and L5-S1 facet joints (9/10 to 0/10) 03/06/2021: MBB to the bilateral L4-5 and L5-S1 facet joints (9/10 to 1/10) 02/26/2021: Left subacromial injection (good relief for 6 months) 08/29/2020: Bilateral L4-5 transforaminal ESI (75% improvement) 08/16/2020: Left subacromial injection (immediate improvement of pain and increase in mobility) 12/03/2017: Bilateral L4-5 transforaminal ESI 01/22/2017: Right L4-5 transforaminal ESI (moderate to good relief) 11/20/2016: Right L4-5 transforaminal ESI 08/16/2016: Right L4-5 transforaminal ESI   Past Surgery: no previous spine surgery  Exam: No exam done as this was a telephone encounter.     Imaging: No recent lumbar imaging.   Assessment and Plan: [name] is a pleasant 72 y.o. male with intermittent LBP that radiates to right hip right groin and medial thigh x years. LBP > right leg pain. No pain in left leg. History of injury to left leg in 2020 and has weakness.  He has known lumbar spondylosis with mild central and foraminal stenosis L3-L4 along with mild lateral recess/bilateral foraminal stenosis  L4-L5. Right leg pain likely from L3-L4. Back pain is likely multifactorial.   Treatment options discussed with patient and following plan made:   - Recommend PT for lumbar spine. See below.  - No relief with previous lumbar injections.  - He is  currently in workup for Parkinson's. Has follow up with neurology on 04/03/22. He would like to hold on further treatment for his lumbar spine for now.  - Will do phone visit with me after the first of the year to regroup.  - Will review MRI with Dr. Izora Richardson to see if he is a candidate for surgery.   ADDENDUM 03/26/22:  Reviewed his imaging with Dr. Izora Richardson. He does not recommend any surgical intervention. May consider referral to ortho and/or referral for pain management (candidate for peripheral nerve stimulator?). Will discuss with patient on his follow up.   Arthur Boot PA-C Neurosurgery

## 2022-05-07 ENCOUNTER — Encounter: Payer: Medicare Other | Admitting: Orthopedic Surgery

## 2022-05-08 NOTE — Progress Notes (Unsigned)
   Telephone Visit- Progress Note: Referring Physician:  No referring provider defined for this encounter.  Primary Physician:  Arthur Roys, DO  This visit was performed via telephone.  Patient location: home Provider location: office  I spent a total of 10 minutes non-face-to-face activities for this visit on the date of this encounter including review of current clinical condition and response to treatment.   Chief Complaint:  follow up   History of Present Illness: Arthur Richardson has a history of HTN, Barrett's esophagus, hypothyroidism, BPH, hyperlipidemia.   Did a phone visit with me on 03/25/22 to review his lumbar MRI. He has known lumbar spondylosis with mild central and foraminal stenosis L3-L4 along with mild lateral recess/bilateral foraminal stenosis L4-L5. We discussed that right leg pain is likely from L3-L4 and back pain is likely multifactorial. Dr. Izora Richardson did not recommend surgical intervention.   We discussed PT at his last visit. He had no relief with previous lumbar injections. He was getting worked up for [employer] and wanted to hold on further treatment.   Phone visit scheduled today to discuss treatment options.   He was placed on sinemet by neurology for probable parkinson's.   He continues with constant  LBP that radiates to right hip right groin and medial thigh. No pain in left leg. History of injury to left leg in 2020 and has weakness. His pain is worse with standing and walking.  Conservative measures:  Physical therapy: did PT at [facility] back in 2018  Multimodal medical therapy including regular antiinflammatories: tylenol, neurontin   Injections:  01/02/2022: Bilateral L4-5 transforaminal ESI (helped for about a week or two) 12/10/2021: Left subacromial injection (good relief) 08/15/2021: Left subacromial injection (good relief for 3 months and then return of pain) 02/26/2021: Left subacromial injection (good relief for approximately 6  months) 04/11/2021: RFA to the bilateral L4-5 and L5-S1 facet joints (good relief for 3 months then moderate continued pain relief) 03/27/2021: MBB to the bilateral L4-5 and L5-S1 facet joints (9/10 to 0/10) 03/06/2021: MBB to the bilateral L4-5 and L5-S1 facet joints (9/10 to 1/10) 02/26/2021: Left subacromial injection (good relief for 6 months) 08/29/2020: Bilateral L4-5 transforaminal ESI (75% improvement) 08/16/2020: Left subacromial injection (immediate improvement of pain and increase in mobility) 12/03/2017: Bilateral L4-5 transforaminal ESI 01/22/2017: Right L4-5 transforaminal ESI (moderate to good relief) 11/20/2016: Right L4-5 transforaminal ESI 08/16/2016: Right L4-5 transforaminal ESI   Past Surgery: no previous spine surgery  Exam: No exam done as this was a telephone encounter.     Imaging: No updated lumbar imaging.   Assessment and Plan: Arthur Richardson is a pleasant 72 y.o. male with more constant LBP that radiates to right hip right groin and medial thigh. No pain in left leg. History of injury to left leg in 2020 and has weakness.  He has known lumbar spondylosis with mild central and foraminal stenosis L3-L4 along with mild lateral recess/bilateral foraminal stenosis L4-L5.   Recent diagnosis of probable parkinson's.   Treatment options discussed with patient and following plan made:   - Recommend PT for lumbar spine. He wants to hold off for now.  - No relief with previous lumbar injections.  - Discussed that Dr. Izora Richardson did not recommend operative intervention.  - Referral to pain management (Arthur Richardson) to discuss possible peripheral nerve stimulator. They will call him to schedule appointment.  - He will f/u with me prn at his request.   Arthur Richardson [facility] Neurosurgery

## 2022-05-09 ENCOUNTER — Ambulatory Visit (INDEPENDENT_AMBULATORY_CARE_PROVIDER_SITE_OTHER): Payer: Medicare Other | Admitting: Orthopedic Surgery

## 2022-05-09 ENCOUNTER — Encounter: Payer: Self-pay | Admitting: Orthopedic Surgery

## 2022-05-09 DIAGNOSIS — M5416 Radiculopathy, lumbar region: Secondary | ICD-10-CM

## 2022-05-09 DIAGNOSIS — M48061 Spinal stenosis, lumbar region without neurogenic claudication: Secondary | ICD-10-CM

## 2022-05-09 DIAGNOSIS — M4726 Other spondylosis with radiculopathy, lumbar region: Secondary | ICD-10-CM

## 2022-05-09 DIAGNOSIS — M47816 Spondylosis without myelopathy or radiculopathy, lumbar region: Secondary | ICD-10-CM

## 2022-05-30 ENCOUNTER — Ambulatory Visit: Payer: Medicare Other | Admitting: Urology

## 2022-06-11 ENCOUNTER — Ambulatory Visit (INDEPENDENT_AMBULATORY_CARE_PROVIDER_SITE_OTHER): Payer: Medicare Other

## 2022-06-11 VITALS — Wt 201.0 lb

## 2022-06-11 DIAGNOSIS — Z0000 Encounter for general adult medical examination without abnormal findings: Secondary | ICD-10-CM | POA: Diagnosis not present

## 2022-06-11 NOTE — Progress Notes (Signed)
I connected with  Arthur Richardson on 06/11/22 by a audio enabled telemedicine application and verified that I am speaking with the correct person using two identifiers.  Patient Location: Home  Provider Location: Home Office  I discussed the limitations of evaluation and management by telemedicine. The patient expressed understanding and agreed to proceed.  Subjective:   Arthur Richardson is a 72 y.o. male who presents for Medicare Annual/Subsequent preventive examination.  Review of Systems     Cardiac Risk Factors include: advanced age (>48mn, >>73women);hypertension;obesity (BMI >30kg/m2);male gender     Objective:    Today's Vitals   06/11/22 0917  PainSc: 4    There is no height or weight on file to calculate BMI.     06/11/2022    9:24 AM 04/09/2021   11:23 AM 04/07/2020    1:48 PM  Advanced Directives  Does Patient Have a Medical Advance Directive? No Yes No  Type of Advance Directive  Living will   Would patient like information on creating a medical advance directive? No - Patient declined      Current Medications (verified) Outpatient Encounter Medications as of 06/11/2022  Medication Sig   aspirin EC 81 MG tablet Take 81 mg by mouth daily.   atorvastatin (LIPITOR) 40 MG tablet Take 1 tablet (40 mg total) by mouth daily.   carbidopa-levodopa (SINEMET IR) 25-100 MG tablet Take 1 tablet by mouth 3 (three) times daily. Follow instructions provided separately in writing.   levothyroxine (SYNTHROID) 50 MCG tablet Take 1 tablet (50 mcg total) by mouth daily before breakfast.   losartan (COZAAR) 100 MG tablet Take 1 tablet (100 mg total) by mouth daily.   Omega-3 1000 MG CAPS Take 1 capsule by mouth daily.   tamsulosin (FLOMAX) 0.4 MG CAPS capsule Take 1 capsule (0.4 mg total) by mouth daily.   furosemide (LASIX) 40 MG tablet Take 1 tablet (40 mg total) by mouth daily as needed. (Patient not taking: Reported on 06/11/2022)   verapamil (CALAN-SR) 120 MG CR tablet  Take 1 tablet (120 mg total) by mouth at bedtime. (Patient not taking: Reported on 06/11/2022)   No facility-administered encounter medications on file as of 06/11/2022.    Allergies (verified) Penicillins   History: Past Medical History:  Diagnosis Date   Hyperlipidemia    Hypertension    Past Surgical History:  Procedure Laterality Date   LITHOTRIPSY  09/1998 & 07/29/1999   Due to nephrolithiasis   PROSTATE SURGERY     Biopsy= Negative   TONSILLECTOMY     Family History  Problem Relation Age of Onset   Stroke Father    Cancer Sister        Pancreatic Cancer   Parkinson's disease Neg Hx    Tremor Neg Hx    Social History   Socioeconomic History   Marital status: Married    Spouse name: Not on file   Number of children: Not on file   Years of education: Not on file   Highest education level: Not on file  Occupational History   Not on file  Tobacco Use   Smoking status: Former    Types: Cigarettes    Quit date: 01/31/1980    Years since quitting: 42.3    Passive exposure: Past   Smokeless tobacco: Current    Types: Chew  Vaping Use   Vaping Use: Never used  Substance and Sexual Activity   Alcohol use: Yes    Alcohol/week: 14.0 standard drinks of alcohol  Types: 14 Cans of beer per week   Drug use: No   Sexual activity: Yes  Other Topics Concern   Not on file  Social History Narrative   Not on file   Social Determinants of Health   Financial Resource Strain: Low Risk  (06/11/2022)   Overall Financial Resource Strain (CARDIA)    Difficulty of Paying Living Expenses: Not hard at all  Food Insecurity: No Food Insecurity (06/11/2022)   Hunger Vital Sign    Worried About Running Out of Food in the Last Year: Never true    Ran Out of Food in the Last Year: Never true  Transportation Needs: No Transportation Needs (06/11/2022)   PRAPARE - Hydrologist (Medical): No    Lack of Transportation (Non-Medical): No  Physical Activity:  Inactive (06/11/2022)   Exercise Vital Sign    Days of Exercise per Week: 0 days    Minutes of Exercise per Session: 0 min  Stress: No Stress Concern Present (06/11/2022)   [place]    Feeling of Stress : Not at all  Social Connections: Moderately Integrated (06/11/2022)   Social Connection and Isolation Panel [NHANES]    Frequency of Communication with Friends and Family: Three times a week    Frequency of Social Gatherings with Friends and Family: Twice a week    Attends Religious Services: More than 4 times per year    Active Member of [employer] or Organizations: No    Attends Music therapist: Never    Marital Status: Married    Tobacco Counseling Ready to quit: Not Answered Counseling given: Not Answered   Clinical Intake:  Pre-visit preparation completed: Yes  Pain : 0-10 Pain Score: 4  Pain Type: Chronic pain Pain Location: Back     Nutritional Risks: None Diabetes: No  How often do you need to have someone help you when you read instructions, pamphlets, or other written materials from your doctor or pharmacy?: 1 - Never  Diabetic?no  Interpreter Needed?: No  Information entered by :: Kirke Shaggy, LPN   Activities of Daily Living    06/11/2022    9:25 AM  In your present state of health, do you have any difficulty performing the following activities:  Hearing? 0  Vision? 0  Difficulty concentrating or making decisions? 0  Walking or climbing stairs? 1  Dressing or bathing? 0  Doing errands, shopping? 0  Preparing Food and eating ? N  Using the Toilet? N  In the past six months, have you accidently leaked urine? N  Do you have problems with loss of bowel control? N  Managing your Medications? N  Managing your Finances? N  Housekeeping or managing your Housekeeping? N    Patient Care Team: Valerie Roys, DO as PCP - General (Family Medicine) Kate Sable, MD as  Consulting Physician (Cardiology) Kate Sable, MD as Consulting Physician (Cardiology)  Indicate any recent Medical Services you may have received from other than [facility] providers in the past year (date may be approximate).     Assessment:   This is a routine wellness examination for [name]  Hearing/Vision screen Hearing Screening - Comments:: No aids Vision Screening - Comments:: Wears glasses- [facility]  Dietary issues and exercise activities discussed: Current Exercise Habits: The patient does not participate in regular exercise at present   Goals Addressed             This Visit's  Progress    DIET - EAT MORE FRUITS AND VEGETABLES         Depression Screen    06/11/2022    9:22 AM 04/01/2022    2:28 PM 02/18/2022    8:38 AM 11/15/2021   10:01 AM 08/13/2021    9:15 AM 05/24/2021    8:09 AM 04/09/2021   11:47 AM  PHQ 2/9 Scores  PHQ - 2 Score 0 0 0 0 0 0 0  PHQ- 9 Score 0 7 3 6 $ 0 0     Fall Risk    06/11/2022    9:25 AM 04/01/2022    2:28 PM 02/18/2022    8:37 AM 05/24/2021    8:10 AM 04/09/2021   11:22 AM  Fall Risk   Falls in the past year? 0 0 0 0 0  Number falls in past yr: 0 0 0 0 0  Injury with Fall? 0 0 0 0 0  Risk for fall due to : No Fall Risks No Fall Risks No Fall Risks No Fall Risks   Follow up Falls prevention discussed;Falls evaluation completed Falls evaluation completed Falls evaluation completed Falls evaluation completed Falls evaluation completed;Falls prevention discussed;Education provided    FALL RISK PREVENTION PERTAINING TO THE HOME:  Any stairs in or around the home? Yes  If so, are there any without handrails? No  Home free of loose throw rugs in walkways, pet beds, electrical cords, etc? Yes  Adequate lighting in your home to reduce risk of falls? Yes   ASSISTIVE DEVICES UTILIZED TO PREVENT FALLS:  Life alert? No  Use of a cane, walker or w/c? Yes  Grab bars in the bathroom? No  Shower chair or bench in shower? Yes   Elevated toilet seat or a handicapped toilet? No   Cognitive Function:        06/11/2022    9:26 AM 04/07/2020    1:51 PM 04/06/2019    8:25 AM  6CIT Screen  What Year? 0 points 0 points 0 points  What month? 0 points 0 points 0 points  What time? 0 points 0 points 0 points  Count back from 20 0 points 0 points 0 points  Months in reverse 4 points 0 points 0 points  Repeat phrase 4 points 6 points 2 points  Total Score 8 points 6 points 2 points    Immunizations Immunization History  Administered Date(s) Administered   Fluad Quad(high Dose 65+) 01/11/2019, 01/24/2020, 01/22/2021, 01/22/2022   Influenza, High Dose Seasonal PF 02/01/2016, 02/02/2018   Influenza,inj,Quad PF,6+ Mos 01/31/2015   Pneumococcal Conjugate-13 08/31/2014   Pneumococcal Polysaccharide-23 04/06/2019   Td 05/24/2021   Tdap 12/03/2010    TDAP status: Up to date  Flu Vaccine status: Up to date  Pneumococcal vaccine status: Up to date  Covid-19 vaccine status: Declined, Education has been provided regarding the importance of this vaccine but patient still declined. Advised may receive this vaccine at local pharmacy or Health Dept.or vaccine clinic. Aware to provide a copy of the vaccination record if obtained from local pharmacy or Health Dept. Verbalized acceptance and understanding.  Qualifies for Shingles Vaccine? Yes   Zostavax completed No   Shingrix Completed?: No.    Education has been provided regarding the importance of this vaccine. Patient has been advised to call insurance company to determine out of pocket expense if they have not yet received this vaccine. Advised may also receive vaccine at local pharmacy or Health Dept. Verbalized acceptance and understanding.  Screening Tests Health Maintenance  Topic Date Due   COVID-19 Vaccine (1) Never done   Zoster Vaccines- Shingrix (1 of 2) Never done   Medicare Annual Wellness (AWV)  06/12/2023   COLONOSCOPY (Pts 45-3yr Insurance coverage  will need to be confirmed)  03/19/2027   DTaP/Tdap/Td (3 - Td or Tdap) 05/25/2031   Pneumonia Vaccine 72 Years old  Completed   INFLUENZA VACCINE  Completed   Hepatitis C Screening  Completed   HPV VACCINES  Aged Out    Health Maintenance  Health Maintenance Due  Topic Date Due   COVID-19 Vaccine (1) Never done   Zoster Vaccines- Shingrix (1 of 2) Never done    Colorectal cancer screening: Type of screening: Colonoscopy. Completed 03/18/17. Repeat every 10 years  Lung Cancer Screening: (Low Dose CT Chest recommended if Age 74-80years, 30 pack-year currently smoking OR have quit w/in 15years.) does not qualify.   Additional Screening:  Hepatitis C Screening: does qualify; Completed 08/07/15  Vision Screening: Recommended annual ophthalmology exams for early detection of glaucoma and other disorders of the eye. Is the patient up to date with their annual eye exam?  Yes  Who is the provider or what is the name of the office in which the patient attends annual eye exams? [facility] pt is not established with a provider, would they like to be referred to a provider to establish care? No .   Dental Screening: Recommended annual dental exams for proper oral hygiene  Community Resource Referral / Chronic Care Management: CRR required this visit?  No   CCM required this visit?  No      Plan:     I have personally reviewed and noted the following in the patient's chart:   Medical and social history Use of alcohol, tobacco or illicit drugs  Current medications and supplements including opioid prescriptions. Patient is not currently taking opioid prescriptions. Functional ability and status Nutritional status Physical activity Advanced directives List of other physicians Hospitalizations, surgeries, and ER visits in previous 12 months Vitals Screenings to include cognitive, depression, and falls Referrals and appointments  In addition, I have reviewed and discussed  with patient certain preventive protocols, quality metrics, and best practice recommendations. A written personalized care plan for preventive services as well as general preventive health recommendations were provided to patient.     LDionisio David LPN   [id number]  Nurse Notes: none

## 2022-06-11 NOTE — Patient Instructions (Signed)
Arthur Richardson , Thank you for taking time to come for your Medicare Wellness Visit. I appreciate your ongoing commitment to your health goals. Please review the following plan we discussed and let me know if I can assist you in the future.   These are the goals we discussed:  Goals      DIET - EAT MORE FRUITS AND VEGETABLES     Patient Stated     04/07/2020, no goals     Patient Stated     No goals        This is a list of the screening recommended for you and due dates:  Health Maintenance  Topic Date Due   COVID-19 Vaccine (1) Never done   Zoster (Shingles) Vaccine (1 of 2) Never done   Medicare Annual Wellness Visit  06/12/2023   Colon Cancer Screening  03/19/2027   DTaP/Tdap/Td vaccine (3 - Td or Tdap) 05/25/2031   Pneumonia Vaccine  Completed   Flu Shot  Completed   Hepatitis C Screening: USPSTF Recommendation to screen - Ages 18-79 yo.  Completed   HPV Vaccine  Aged Out    Advanced directives: no  Conditions/risks identified: none  Next appointment: Follow up in one year for your annual wellness visit. 06/16/23 @ 3 pm by phone  Preventive Care 65 Years and Older, Male  Preventive care refers to lifestyle choices and visits with your health care provider that can promote health and wellness. What does preventive care include? A yearly physical exam. This is also called an annual well check. Dental exams once or twice a year. Routine eye exams. Ask your health care provider how often you should have your eyes checked. Personal lifestyle choices, including: Daily care of your teeth and gums. Regular physical activity. Eating a healthy diet. Avoiding tobacco and drug use. Limiting alcohol use. Practicing safe sex. Taking low doses of aspirin every day. Taking vitamin and mineral supplements as recommended by your health care provider. What happens during an annual well check? The services and screenings done by your health care provider during your annual well  check will depend on your age, overall health, lifestyle risk factors, and family history of disease. Counseling  Your health care provider may ask you questions about your: Alcohol use. Tobacco use. Drug use. Emotional well-being. Home and relationship well-being. Sexual activity. Eating habits. History of falls. Memory and ability to understand (cognition). Work and work Statistician. Screening  You may have the following tests or measurements: Height, weight, and BMI. Blood pressure. Lipid and cholesterol levels. These may be checked every 5 years, or more frequently if you are over 24 years old. Skin check. Lung cancer screening. You may have this screening every year starting at age 55 if you have a 30-pack-year history of smoking and currently smoke or have quit within the past 15 years. Fecal occult blood test (FOBT) of the stool. You may have this test every year starting at age 31. Flexible sigmoidoscopy or colonoscopy. You may have a sigmoidoscopy every 5 years or a colonoscopy every 10 years starting at age 24. Prostate cancer screening. Recommendations will vary depending on your family history and other risks. Hepatitis C blood test. Hepatitis B blood test. Sexually transmitted disease (STD) testing. Diabetes screening. This is done by checking your blood sugar (glucose) after you have not eaten for a while (fasting). You may have this done every 1-3 years. Abdominal aortic aneurysm (AAA) screening. You may need this if you are a current or  former smoker. Osteoporosis. You may be screened starting at age 64 if you are at high risk. Talk with your health care provider about your test results, treatment options, and if necessary, the need for more tests. Vaccines  Your health care provider may recommend certain vaccines, such as: Influenza vaccine. This is recommended every year. Tetanus, diphtheria, and acellular pertussis (Tdap, Td) vaccine. You may need a Td booster  every 10 years. Zoster vaccine. You may need this after age 29. Pneumococcal 13-valent conjugate (PCV13) vaccine. One dose is recommended after age 78. Pneumococcal polysaccharide (PPSV23) vaccine. One dose is recommended after age 49. Talk to your health care provider about which screenings and vaccines you need and how often you need them. This information is not intended to replace advice given to you by your health care provider. Make sure you discuss any questions you have with your health care provider. Document Released: 05/12/2015 Document Revised: 01/03/2016 Document Reviewed: 02/14/2015 Elsevier Interactive Patient Education  2017 [place] Prevention in the Home Falls can cause injuries. They can happen to people of all ages. There are many things you can do to make your home safe and to help prevent falls. What can I do on the outside of my home? Regularly fix the edges of walkways and driveways and fix any cracks. Remove anything that might make you trip as you walk through a door, such as a raised step or threshold. Trim any bushes or trees on the path to your home. Use bright outdoor lighting. Clear any walking paths of anything that might make someone trip, such as rocks or tools. Regularly check to see if handrails are loose or broken. Make sure that both sides of any steps have handrails. Any raised decks and porches should have guardrails on the edges. Have any leaves, snow, or ice cleared regularly. Use sand or salt on walking paths during winter. Clean up any spills in your garage right away. This includes oil or grease spills. What can I do in the bathroom? Use night lights. Install grab bars by the toilet and in the tub and shower. Do not use towel bars as grab bars. Use non-skid mats or decals in the tub or shower. If you need to sit down in the shower, use a plastic, non-slip stool. Keep the floor dry. Clean up any water that spills on the floor as soon  as it happens. Remove soap buildup in the tub or shower regularly. Attach bath mats securely with double-sided non-slip rug tape. Do not have throw rugs and other things on the floor that can make you trip. What can I do in the bedroom? Use night lights. Make sure that you have a light by your bed that is easy to reach. Do not use any sheets or blankets that are too big for your bed. They should not hang down onto the floor. Have a firm chair that has side arms. You can use this for support while you get dressed. Do not have throw rugs and other things on the floor that can make you trip. What can I do in the kitchen? Clean up any spills right away. Avoid walking on wet floors. Keep items that you use a lot in easy-to-reach places. If you need to reach something above you, use a strong step stool that has a grab bar. Keep electrical cords out of the way. Do not use floor polish or wax that makes floors slippery. If you must use wax, use  non-skid floor wax. Do not have throw rugs and other things on the floor that can make you trip. What can I do with my stairs? Do not leave any items on the stairs. Make sure that there are handrails on both sides of the stairs and use them. Fix handrails that are broken or loose. Make sure that handrails are as long as the stairways. Check any carpeting to make sure that it is firmly attached to the stairs. Fix any carpet that is loose or worn. Avoid having throw rugs at the top or bottom of the stairs. If you do have throw rugs, attach them to the floor with carpet tape. Make sure that you have a light switch at the top of the stairs and the bottom of the stairs. If you do not have them, ask someone to add them for you. What else can I do to help prevent falls? Wear shoes that: Do not have high heels. Have rubber bottoms. Are comfortable and fit you well. Are closed at the toe. Do not wear sandals. If you use a stepladder: Make sure that it is fully  opened. Do not climb a closed stepladder. Make sure that both sides of the stepladder are locked into place. Ask someone to hold it for you, if possible. Clearly mark and make sure that you can see: Any grab bars or handrails. First and last steps. Where the edge of each step is. Use tools that help you move around (mobility aids) if they are needed. These include: Canes. Walkers. Scooters. Crutches. Turn on the lights when you go into a dark area. Replace any light bulbs as soon as they burn out. Set up your furniture so you have a clear path. Avoid moving your furniture around. If any of your floors are uneven, fix them. If there are any pets around you, be aware of where they are. Review your medicines with your doctor. Some medicines can make you feel dizzy. This can increase your chance of falling. Ask your doctor what other things that you can do to help prevent falls. This information is not intended to replace advice given to you by your health care provider. Make sure you discuss any questions you have with your health care provider. Document Released: 02/09/2009 Document Revised: 09/21/2015 Document Reviewed: 05/20/2014 Elsevier Interactive Patient Education  2017 [employer].

## 2022-06-13 ENCOUNTER — Ambulatory Visit: Payer: Medicare Other | Admitting: Urology

## 2022-06-13 ENCOUNTER — Encounter: Payer: Self-pay | Admitting: Urology

## 2022-06-13 ENCOUNTER — Telehealth: Payer: Self-pay

## 2022-06-13 VITALS — BP 144/82 | HR 71 | Ht 68.0 in | Wt 192.0 lb

## 2022-06-13 DIAGNOSIS — N138 Other obstructive and reflux uropathy: Secondary | ICD-10-CM | POA: Diagnosis not present

## 2022-06-13 DIAGNOSIS — N3281 Overactive bladder: Secondary | ICD-10-CM | POA: Diagnosis not present

## 2022-06-13 DIAGNOSIS — N401 Enlarged prostate with lower urinary tract symptoms: Secondary | ICD-10-CM | POA: Diagnosis not present

## 2022-06-13 LAB — URINALYSIS, COMPLETE
Bilirubin, UA: NEGATIVE
Glucose, UA: NEGATIVE
Ketones, UA: NEGATIVE
Nitrite, UA: POSITIVE — AB
Protein,UA: NEGATIVE
RBC, UA: NEGATIVE
Specific Gravity, UA: 1.02 (ref 1.005–1.030)
Urobilinogen, Ur: 1 mg/dL (ref 0.2–1.0)
pH, UA: 6 (ref 5.0–7.5)

## 2022-06-13 LAB — MICROSCOPIC EXAMINATION: WBC, UA: >30 /hpf — AB (ref 0–5)

## 2022-06-13 LAB — BLADDER SCAN AMB NON-IMAGING

## 2022-06-13 MED ORDER — MIRABEGRON ER 50 MG PO TB24: 50.0000 mg | ORAL_TABLET | Freq: Every day | ORAL | 3 refills | Status: DC

## 2022-06-13 MED ORDER — TAMSULOSIN HCL 0.4 MG PO CAPS: 0.4000 mg | ORAL_CAPSULE | Freq: Every day | ORAL | 3 refills | Status: DC

## 2022-06-13 NOTE — Progress Notes (Signed)
   06/13/2022 10:15 AM   Arthur Richardson Oct 10, 1950 [id number]  Reason for visit: Follow up BPH, OAB, PSA screening, history of nephrolithiasis  HPI: Comorbid 72 year old male with new diagnosis of Parkinson's who I have previously followed for the above issues.  At our last visit in March 2023 he opted to discontinue dutasteride and oxybutynin, but remained on Flomax.  It sounds like he had some increasing bothersome urgency, frequency, urge incontinence, nocturia, nocturnal enuresis and was given samples of Myrbetriq by one of the nurses in clinic a few weeks ago.  Patient and his wife report significant improvement on the Myrbetriq, with massive improvement in his urinary symptoms and incontinence.  PVR today is normal at 38m.  They are interested in pursuing a prescription for Myrbetriq.  With his age, frailty, Parkinson's disease, he is not a candidate for anticholinergic medication secondary to high risk of confusion and falls.  He did have a UTI in July 2023 with symptoms of dysuria that was treated with antibiotics, denies any gross hematuria or recurrent dysuria since that time.  Most recent PSA was normal at 2.5 from January 2023 and stable from the last 5 years, will discontinue screening with his age over 759per guideline recommendations, as well as his frailty and Parkinson's disease.  -Myrbetriq 50 mg daily, okay to trial Gemtesa if Myrbetriq not affordable -Continue Flomax 0.4 mg nightly -RTC 1 year PVR  BBilley Co MD  [id number] [place] [phone]

## 2022-06-13 NOTE — Telephone Encounter (Signed)
Unfortunately Arthur Richardson is a tier 5 and not on his formulary at all so would not be covered in any capacity. I am happy to call patient and explain this to him.

## 2022-06-13 NOTE — Addendum Note (Signed)
Addended by: Despina Hidden on: 06/13/2022 03:36 PM   Modules accepted: Orders

## 2022-06-13 NOTE — Telephone Encounter (Signed)
Patient states that he went to get Rx today and he cannot afford the Myrbetriq due to the high co-pay ($131).  He would like to know if there are any other alternatives.

## 2022-06-14 NOTE — Telephone Encounter (Signed)
I can certainly provide him with samples, he does not qualify for patience assistance program with Myrbetriq as he is insured and his insurance covers a significant amount of the drug cost. See below:   [place] Patient Assistance Program The [place] Patient Assistance Programa (PAP) provides Myrbetriq (mirabegron extended-release tablets) at no cost to patients who meet the program eligibility requirements. The PAP is not available for patients prescribed Myrbetriq granules (mirabegron for extended-release oral suspension).  Eligibility Requirements [employer] can assess whether the patient meets the eligibility requirements. For more information, contact us at [phone]. We are available Monday-Friday, 9:00 am-8:00 pm ET.  The patient may be eligible if they meet the following criteria:  Be an adult 18 years of age or older Be uninsured or have insurance that excludes coverage for Myrbetriq Have a verifiable shipping address in the [place] States Have been prescribed Myrbetriq for an FDA-approved indication Meet the program financial eligibility requirements

## 2022-06-18 LAB — CULTURE, URINE COMPREHENSIVE

## 2022-06-19 ENCOUNTER — Telehealth: Payer: Self-pay

## 2022-06-19 DIAGNOSIS — N390 Urinary tract infection, site not specified: Secondary | ICD-10-CM

## 2022-06-19 MED ORDER — SULFAMETHOXAZOLE-TRIMETHOPRIM 800-160 MG PO TABS: 1.0000 | ORAL_TABLET | Freq: Two times a day (BID) | ORAL | 0 refills | Status: AC

## 2022-06-19 NOTE — Telephone Encounter (Signed)
Called pt to inform him that unfortunately Myrbetriq is not covered and he is not a candidate for the alternative mediations. Pt did not answer. Unable to leave message as voicemail is full. 1st attempt.

## 2022-06-19 NOTE — Telephone Encounter (Signed)
Pt called back. Pt informed.

## 2022-06-19 NOTE — Telephone Encounter (Signed)
Called pt informed him of the information below. Pt voiced understanding, RX sent in.

## 2022-06-19 NOTE — Telephone Encounter (Signed)
-----   Message from Billey Co, MD sent at 06/19/2022  8:08 AM EST ----- Culture did show UTI, recommend Bactrim DS BID X 2 weeks, thanks  Nickolas Madrid, MD 06/19/2022

## 2022-07-01 ENCOUNTER — Ambulatory Visit (INDEPENDENT_AMBULATORY_CARE_PROVIDER_SITE_OTHER): Payer: Medicare Other | Admitting: Family Medicine

## 2022-07-01 ENCOUNTER — Encounter: Payer: Self-pay | Admitting: Family Medicine

## 2022-07-01 VITALS — BP 99/62 | HR 77 | Temp 98.2°F | Ht 68.0 in | Wt 192.0 lb

## 2022-07-01 DIAGNOSIS — N401 Enlarged prostate with lower urinary tract symptoms: Secondary | ICD-10-CM | POA: Diagnosis not present

## 2022-07-01 DIAGNOSIS — N138 Other obstructive and reflux uropathy: Secondary | ICD-10-CM

## 2022-07-01 DIAGNOSIS — G20A1 Parkinson's disease without dyskinesia, without mention of fluctuations: Secondary | ICD-10-CM | POA: Diagnosis not present

## 2022-07-01 DIAGNOSIS — Z0000 Encounter for general adult medical examination without abnormal findings: Secondary | ICD-10-CM

## 2022-07-01 DIAGNOSIS — D692 Other nonthrombocytopenic purpura: Secondary | ICD-10-CM | POA: Diagnosis not present

## 2022-07-01 DIAGNOSIS — E039 Hypothyroidism, unspecified: Secondary | ICD-10-CM

## 2022-07-01 DIAGNOSIS — E7849 Other hyperlipidemia: Secondary | ICD-10-CM

## 2022-07-01 DIAGNOSIS — I10 Essential (primary) hypertension: Secondary | ICD-10-CM

## 2022-07-01 DIAGNOSIS — G44029 Chronic cluster headache, not intractable: Secondary | ICD-10-CM

## 2022-07-01 LAB — URINALYSIS, ROUTINE W REFLEX MICROSCOPIC
Bilirubin, UA: NEGATIVE
Glucose, UA: NEGATIVE
Ketones, UA: NEGATIVE
Leukocytes,UA: NEGATIVE
Nitrite, UA: NEGATIVE
Protein,UA: NEGATIVE
RBC, UA: NEGATIVE
Specific Gravity, UA: 1.025 (ref 1.005–1.030)
Urobilinogen, Ur: 2 mg/dL — ABNORMAL HIGH (ref 0.2–1.0)
pH, UA: 5.5 (ref 5.0–7.5)

## 2022-07-01 LAB — MICROALBUMIN, URINE WAIVED
Creatinine, Urine Waived: 200 mg/dL (ref 10–300)
Microalb, Ur Waived: 30 mg/L — ABNORMAL HIGH (ref 0–19)
Microalb/Creat Ratio: <30 mg/g (ref <30)

## 2022-07-01 MED ORDER — VERAPAMIL HCL ER 120 MG PO TBCR: 60.0000 mg | EXTENDED_RELEASE_TABLET | Freq: Every day | ORAL | 1 refills | Status: DC

## 2022-07-01 NOTE — Assessment & Plan Note (Signed)
BP running low. Will cut his verapamil in 1/2 and recheck 1 month. Call with any concerns.

## 2022-07-01 NOTE — Assessment & Plan Note (Signed)
Reassured patient. Continue to monitor.  

## 2022-07-01 NOTE — Assessment & Plan Note (Signed)
Under good control on current regimen. Continue current regimen. Continue to monitor. Call with any concerns. Refills given. Labs drawn today.   

## 2022-07-01 NOTE — Assessment & Plan Note (Signed)
Rechecking labs today. Await results. Treat as needed.  °

## 2022-07-01 NOTE — Assessment & Plan Note (Signed)
Not tolerating his sinamet well. Advised follow up with neurology. Would like to see someone closer- referral to Dr. Manuella Ghazi placed today. Balance issues likely complicated by hypotension. Will decrease blood pressure medicine and recheck 1 month.

## 2022-07-01 NOTE — Assessment & Plan Note (Signed)
Under good control on current regimen. Continue current regimen. Continue to monitor. Call with any concerns. Refills given.   

## 2022-07-01 NOTE — Progress Notes (Signed)
BP 99/62   Pulse 77   Temp 98.2 F (36.8 C) (Oral)   Ht '5\' 8"'$  (1.727 m)   Wt 192 lb (87.1 kg)   SpO2 98%   BMI 29.19 kg/m    Subjective:    Patient ID: Arthur Richardson, male    DOB: [birth year], 72 y.o.   MRN: [id number]  HPI: Corydon Kissner is a 72 y.o. male presenting on 07/01/2022 for comprehensive medical examination. Current medical complaints include:  Diagnosed with Parkinson's- doesn't like the sinemet. He notes that he has been more constipated. He has already gotten up to the TID dosing on the sinemet. He is not liking how it makes him feel. He is having issues with the dosing also.   HYPERTENSION / HYPERLIPIDEMIA Satisfied with current treatment? yes Duration of hypertension: chronic BP monitoring frequency: not checking BP medication side effects: no Past BP meds: verapamil, losartan, lasix Duration of hyperlipidemia: chronic Cholesterol medication side effects: no Cholesterol supplements: fish oil Past cholesterol medications: atorvastatin Medication compliance: excellent compliance Aspirin: no Recent stressors: no Recurrent headaches: no Visual changes: no Palpitations: no Dyspnea: no Chest pain: no Lower extremity edema: no Dizzy/lightheaded: no  HYPOTHYROIDISM Thyroid control status:controlled Satisfied with current treatment? yes Medication side effects: no Medication compliance: excellent compliance Recent dose adjustment:no Fatigue: yes Cold intolerance: no Heat intolerance: no Weight gain: no Weight loss: yes Constipation: yes Diarrhea/loose stools: no Palpitations: no Lower extremity edema: no Anxiety/depressed mood: no  He currently lives with: wife Interim Problems from his last visit: no  Depression Screen done today and results listed below:     07/01/2022    9:11 AM 06/11/2022    9:22 AM 04/01/2022    2:28 PM 02/18/2022    8:38 AM 11/15/2021   10:01 AM  Depression screen PHQ 2/9  Decreased Interest 3 0 0 0 0  Down,  Depressed, Hopeless 0 0 0 0 0  PHQ - 2 Score 3 0 0 0 0  Altered sleeping 0 0 2 0 0  Tired, decreased energy 2 0 2 0 3  Change in appetite 0 0 0 0 0  Feeling bad or failure about yourself  0 0 0 0 0  Trouble concentrating 0 0 0 0 0  Moving slowly or fidgety/restless 3 0 '3 3 3  '$ Suicidal thoughts 0 0 0 0 0  PHQ-9 Score 8 0 '7 3 6  '$ Difficult doing work/chores Not difficult at all Not difficult at all Not difficult at all Not difficult at all Somewhat difficult    Past Medical History:  Past Medical History:  Diagnosis Date   Hyperlipidemia    Hypertension     Surgical History:  Past Surgical History:  Procedure Laterality Date   LITHOTRIPSY  09/1998 & 07/29/1999   Due to nephrolithiasis   PROSTATE SURGERY     Biopsy= Negative   TONSILLECTOMY      Medications:  Current Outpatient Medications on File Prior to Visit  Medication Sig   aspirin EC 81 MG tablet Take 81 mg by mouth daily.   atorvastatin (LIPITOR) 40 MG tablet Take 1 tablet (40 mg total) by mouth daily.   carbidopa-levodopa (SINEMET IR) 25-100 MG tablet Take 1 tablet by mouth 3 (three) times daily. Follow instructions provided separately in writing.   furosemide (LASIX) 40 MG tablet Take 1 tablet (40 mg total) by mouth daily as needed.   levothyroxine (SYNTHROID) 50 MCG tablet Take 1 tablet (50 mcg total) by mouth daily before breakfast.  losartan (COZAAR) 100 MG tablet Take 1 tablet (100 mg total) by mouth daily.   Omega-3 1000 MG CAPS Take 1 capsule by mouth daily.   sulfamethoxazole-trimethoprim (BACTRIM DS) 800-160 MG tablet Take 1 tablet by mouth 2 (two) times daily for 14 days.   tamsulosin (FLOMAX) 0.4 MG CAPS capsule Take 1 capsule (0.4 mg total) by mouth daily.   mirabegron ER (MYRBETRIQ) 50 MG TB24 tablet Take 1 tablet (50 mg total) by mouth daily. (Patient not taking: Reported on 07/01/2022)   No current facility-administered medications on file prior to visit.    Allergies:  Allergies  Allergen Reactions    Penicillins     Social History:  Social History   Socioeconomic History   Marital status: Married    Spouse name: Not on file   Number of children: Not on file   Years of education: Not on file   Highest education level: Not on file  Occupational History   Not on file  Tobacco Use   Smoking status: Former    Types: Cigarettes    Quit date: 01/31/1980    Years since quitting: 42.4    Passive exposure: Past   Smokeless tobacco: Current    Types: Chew  Vaping Use   Vaping Use: Never used  Substance and Sexual Activity   Alcohol use: Yes    Alcohol/week: 14.0 standard drinks of alcohol    Types: 14 Cans of beer per week   Drug use: No   Sexual activity: Yes  Other Topics Concern   Not on file  Social History Narrative   Not on file   Social Determinants of Health   Financial Resource Strain: Low Risk  (06/11/2022)   Overall Financial Resource Strain (CARDIA)    Difficulty of Paying Living Expenses: Not hard at all  Food Insecurity: No Food Insecurity (06/11/2022)   Hunger Vital Sign    Worried About Running Out of Food in the Last Year: Never true    Ran Out of Food in the Last Year: Never true  Transportation Needs: No Transportation Needs (06/11/2022)   PRAPARE - Hydrologist (Medical): No    Lack of Transportation (Non-Medical): No  Physical Activity: Inactive (06/11/2022)   Exercise Vital Sign    Days of Exercise per Week: 0 days    Minutes of Exercise per Session: 0 min  Stress: No Stress Concern Present (06/11/2022)   [place]    Feeling of Stress : Not at all  Social Connections: Moderately Integrated (06/11/2022)   Social Connection and Isolation Panel [NHANES]    Frequency of Communication with Friends and Family: Three times a week    Frequency of Social Gatherings with Friends and Family: Twice a week    Attends Religious Services: More than 4 times per year     Active Member of [employer] or Organizations: No    Attends Archivist Meetings: Never    Marital Status: Married  Human resources officer Violence: Not At Risk (06/11/2022)   Humiliation, Afraid, Rape, and Kick questionnaire    Fear of Current or Ex-Partner: No    Emotionally Abused: No    Physically Abused: No    Sexually Abused: No   Social History   Tobacco Use  Smoking Status Former   Types: Cigarettes   Quit date: 01/31/1980   Years since quitting: 42.4   Passive exposure: Past  Smokeless Tobacco Current   Types: Loss adjuster, chartered  Social History   Substance and Sexual Activity  Alcohol Use Yes   Alcohol/week: 14.0 standard drinks of alcohol   Types: 14 Cans of beer per week    Family History:  Family History  Problem Relation Age of Onset   Stroke Father    Cancer Sister        Pancreatic Cancer   Parkinson's disease Neg Hx    Tremor Neg Hx     Past medical history, surgical history, medications, allergies, family history and social history reviewed with patient today and changes made to appropriate areas of the chart.   Review of Systems  Constitutional: Negative.   HENT: Negative.    Eyes: Negative.        Lots of watery eyes  Respiratory:  Positive for shortness of breath. Negative for cough, hemoptysis, sputum production and wheezing.   Cardiovascular: Negative.   Gastrointestinal:  Positive for constipation, heartburn and nausea. Negative for abdominal pain, blood in stool, diarrhea, melena and vomiting.  Genitourinary: Negative.   Musculoskeletal:  Positive for myalgias. Negative for back pain, falls, joint pain and neck pain.  Skin: Negative.   Neurological:  Positive for tremors and weakness. Negative for dizziness, tingling, sensory change, speech change, focal weakness, seizures, loss of consciousness and headaches.  Endo/Heme/Allergies:  Positive for environmental allergies. Negative for polydipsia. Bruises/bleeds easily.  Psychiatric/Behavioral: Negative.      All other ROS negative except what is listed above and in the HPI.      Objective:    BP 99/62   Pulse 77   Temp 98.2 F (36.8 C) (Oral)   Ht '5\' 8"'$  (1.727 m)   Wt 192 lb (87.1 kg)   SpO2 98%   BMI 29.19 kg/m   Wt Readings from Last 3 Encounters:  07/01/22 192 lb (87.1 kg)  06/13/22 192 lb (87.1 kg)  06/11/22 201 lb (91.2 kg)    Physical Exam Vitals and nursing note reviewed.  Constitutional:      General: He is not in acute distress.    Appearance: Normal appearance. He is obese. He is not ill-appearing, toxic-appearing or diaphoretic.  HENT:     Head: Normocephalic and atraumatic.     Right Ear: Tympanic membrane, ear canal and external ear normal. There is no impacted cerumen.     Left Ear: Tympanic membrane, ear canal and external ear normal. There is no impacted cerumen.     Nose: Nose normal. No congestion or rhinorrhea.     Mouth/Throat:     Mouth: Mucous membranes are moist.     Pharynx: Oropharynx is clear. No oropharyngeal exudate or posterior oropharyngeal erythema.  Eyes:     General: No scleral icterus.       Right eye: No discharge.        Left eye: No discharge.     Extraocular Movements: Extraocular movements intact.     Conjunctiva/sclera: Conjunctivae normal.     Pupils: Pupils are equal, round, and reactive to light.  Neck:     Vascular: No carotid bruit.  Cardiovascular:     Rate and Rhythm: Normal rate and regular rhythm.     Pulses: Normal pulses.     Heart sounds: No murmur heard.    No friction rub. No gallop.  Pulmonary:     Effort: Pulmonary effort is normal. No respiratory distress.     Breath sounds: Normal breath sounds. No stridor. No wheezing, rhonchi or rales.  Chest:     Chest wall: No tenderness.  Abdominal:     General: Abdomen is flat. Bowel sounds are normal. There is no distension.     Palpations: Abdomen is soft. There is no mass.     Tenderness: There is no abdominal tenderness. There is no right CVA tenderness, left CVA  tenderness, guarding or rebound.     Hernia: No hernia is present.  Genitourinary:    Comments: Genital exam deferred with shared decision making Musculoskeletal:        General: No swelling, tenderness, deformity or signs of injury.     Cervical back: Normal range of motion and neck supple. No rigidity. No muscular tenderness.     Right lower leg: No edema.     Left lower leg: No edema.  Lymphadenopathy:     Cervical: No cervical adenopathy.  Skin:    General: Skin is warm and dry.     Capillary Refill: Capillary refill takes less than 2 seconds.     Coloration: Skin is not jaundiced or pale.     Findings: No bruising, erythema, lesion or rash.  Neurological:     General: No focal deficit present.     Mental Status: He is alert and oriented to person, place, and time.     Cranial Nerves: No cranial nerve deficit.     Sensory: No sensory deficit.     Motor: No weakness.     Coordination: Coordination normal.     Gait: Gait normal.     Deep Tendon Reflexes: Reflexes normal.  Psychiatric:        Mood and Affect: Mood normal.        Behavior: Behavior normal.        Thought Content: Thought content normal.        Judgment: Judgment normal.     Results for orders placed or performed in visit on 06/13/22  Microscopic Examination   Urine  Result Value Ref Range   WBC, UA >30 (A) 0 - 5 /hpf   RBC, Urine 0-2 0 - 2 /hpf   Epithelial Cells (non renal) 0-10 0 - 10 /hpf   Bacteria, UA Many (A) None seen/Few  CULTURE, URINE COMPREHENSIVE   Specimen: Urine   UR  Result Value Ref Range   Urine Culture, Comprehensive Final report (A)    Organism ID, Bacteria Escherichia coli (A)    Organism ID, Bacteria Comment    ANTIMICROBIAL SUSCEPTIBILITY Comment   Urinalysis, Complete  Result Value Ref Range   Specific Gravity, UA 1.020 1.005 - 1.030   pH, UA 6.0 5.0 - 7.5   Color, UA Yellow Yellow   Appearance Ur Cloudy (A) Clear   Leukocytes,UA 2+ (A) Negative   Protein,UA Negative  Negative/Trace   Glucose, UA Negative Negative   Ketones, UA Negative Negative   RBC, UA Negative Negative   Bilirubin, UA Negative Negative   Urobilinogen, Ur 1.0 0.2 - 1.0 mg/dL   Nitrite, UA Positive (A) Negative   Microscopic Examination See below:   BLADDER SCAN AMB NON-IMAGING  Result Value Ref Range   Scan Result 11m       Assessment & Plan:   Problem List Items Addressed This Visit       Cardiovascular and Mediastinum   Hypertension    BP running low. Will cut his verapamil in 1/2 and recheck 1 month. Call with any concerns.       Relevant Medications   verapamil (CALAN-SR) 120 MG CR tablet   Other Relevant Orders   Comprehensive metabolic  panel   CBC with Differential/Platelet   Urinalysis, Routine w reflex microscopic   Microalbumin, Urine Waived   Senile purpura ([place])    Reassured patient. Continue to monitor.       Relevant Medications   verapamil (CALAN-SR) 120 MG CR tablet     Endocrine   Hypothyroidism    Rechecking labs today. Await results. Treat as needed.       Relevant Orders   Comprehensive metabolic panel   CBC with Differential/Platelet   TSH     Nervous and Auditory   Chronic cluster headache, not intractable    Under good control on current regimen. Continue current regimen. Continue to monitor. Call with any concerns. Refills given.        Relevant Medications   verapamil (CALAN-SR) 120 MG CR tablet   Primary Parkinson's disease    Not tolerating his sinamet well. Advised follow up with neurology. Would like to see someone closer- referral to Dr. Manuella Ghazi placed today. Balance issues likely complicated by hypotension. Will decrease blood pressure medicine and recheck 1 month.       Relevant Orders   Ambulatory referral to Neurology     Genitourinary   BPH with obstruction/lower urinary tract symptoms    Under good control on current regimen. Continue current regimen. Continue to monitor. Call with any concerns. Refills given. Labs  drawn today.       Relevant Orders   Comprehensive metabolic panel   CBC with Differential/Platelet   PSA     Other   Hyperlipidemia    Under good control on current regimen. Continue current regimen. Continue to monitor. Call with any concerns. Refills given. Labs drawn today.       Relevant Medications   verapamil (CALAN-SR) 120 MG CR tablet   Other Relevant Orders   Comprehensive metabolic panel   CBC with Differential/Platelet   Lipid Panel w/o Chol/HDL Ratio   Other Visit Diagnoses     Routine general medical examination at a health care facility    -  Primary   Vaccines up to date. Screening labs checked today. Colonoscopy up to date. Continue diet and exercise. Call with any concerns.        Discussed aspirin prophylaxis for myocardial infarction prevention and decision was made to continue ASA  LABORATORY TESTING:  Health maintenance labs ordered today as discussed above.   The natural history of prostate cancer and ongoing controversy regarding screening and potential treatment outcomes of prostate cancer has been discussed with the patient. The meaning of a false positive PSA and a false negative PSA has been discussed. He indicates understanding of the limitations of this screening test and wishes to proceed with screening PSA testing.   IMMUNIZATIONS:   - Tdap: Tetanus vaccination status reviewed: last tetanus booster within 10 years. - Influenza: Up to date - Pneumovax: Up to date - Prevnar: Up to date - COVID: Refused - HPV: Not applicable - Shingrix vaccine: Refused  SCREENING: - Colonoscopy: Up to date  Discussed with patient purpose of the colonoscopy is to detect colon cancer at curable precancerous or early stages   PATIENT COUNSELING:    Sexuality: Discussed sexually transmitted diseases, partner selection, use of condoms, avoidance of unintended pregnancy  and contraceptive alternatives.   Advised to avoid cigarette smoking.  I discussed  with the patient that most people either abstain from alcohol or drink within safe limits (<=14/week and <=4 drinks/occasion for males, <=7/weeks and <= 3 drinks/occasion for females) and that  the risk for alcohol disorders and other health effects rises proportionally with the number of drinks per week and how often a drinker exceeds daily limits.  Discussed cessation/primary prevention of drug use and availability of treatment for abuse.   Diet: Encouraged to adjust caloric intake to maintain  or achieve ideal body weight, to reduce intake of dietary saturated fat and total fat, to limit sodium intake by avoiding high sodium foods and not adding table salt, and to maintain adequate dietary potassium and calcium preferably from fresh fruits, vegetables, and low-fat dairy products.    stressed the importance of regular exercise  Injury prevention: Discussed safety belts, safety helmets, smoke detector, smoking near bedding or upholstery.   Dental health: Discussed importance of regular tooth brushing, flossing, and dental visits.   Follow up plan: NEXT PREVENTATIVE PHYSICAL DUE IN 1 YEAR. Return in about 4 weeks (around 07/29/2022).

## 2022-07-02 LAB — COMPREHENSIVE METABOLIC PANEL
ALT: 11 IU/L (ref 0–44)
AST: 16 IU/L (ref 0–40)
Albumin/Globulin Ratio: 2 (ref 1.2–2.2)
Albumin: 4.5 g/dL (ref 3.8–4.8)
Alkaline Phosphatase: 168 IU/L — ABNORMAL HIGH (ref 44–121)
BUN/Creatinine Ratio: 16 (ref 10–24)
BUN: 24 mg/dL (ref 8–27)
Bilirubin Total: 0.5 mg/dL (ref 0.0–1.2)
CO2: 19 mmol/L — ABNORMAL LOW (ref 20–29)
Calcium: 10.1 mg/dL (ref 8.6–10.2)
Chloride: 104 mmol/L (ref 96–106)
Creatinine, Ser: 1.52 mg/dL — ABNORMAL HIGH (ref 0.76–1.27)
Globulin, Total: 2.2 g/dL (ref 1.5–4.5)
Glucose: 88 mg/dL (ref 70–99)
Potassium: 4 mmol/L (ref 3.5–5.2)
Sodium: 141 mmol/L (ref 134–144)
Total Protein: 6.7 g/dL (ref 6.0–8.5)
eGFR: 48 mL/min/{1.73_m2} — ABNORMAL LOW (ref >59)

## 2022-07-02 LAB — CBC WITH DIFFERENTIAL/PLATELET
Basophils Absolute: 0.1 10*3/uL (ref 0.0–0.2)
Basos: 1 %
EOS (ABSOLUTE): 0.2 10*3/uL (ref 0.0–0.4)
Eos: 3 %
Hematocrit: 40.6 % (ref 37.5–51.0)
Hemoglobin: 13.5 g/dL (ref 13.0–17.7)
Immature Grans (Abs): 0 10*3/uL (ref 0.0–0.1)
Immature Granulocytes: 0 %
Lymphocytes Absolute: 2 10*3/uL (ref 0.7–3.1)
Lymphs: 31 %
MCH: 31.6 pg (ref 26.6–33.0)
MCHC: 33.3 g/dL (ref 31.5–35.7)
MCV: 95 fL (ref 79–97)
Monocytes Absolute: 0.6 10*3/uL (ref 0.1–0.9)
Monocytes: 9 %
Neutrophils Absolute: 3.6 10*3/uL (ref 1.4–7.0)
Neutrophils: 56 %
Platelets: 192 10*3/uL (ref 150–450)
RBC: 4.27 x10E6/uL (ref 4.14–5.80)
RDW: 12.4 % (ref 11.6–15.4)
WBC: 6.4 10*3/uL (ref 3.4–10.8)

## 2022-07-02 LAB — TSH: TSH: 0.798 u[IU]/mL (ref 0.450–4.500)

## 2022-07-02 LAB — LIPID PANEL W/O CHOL/HDL RATIO
Cholesterol, Total: 130 mg/dL (ref 100–199)
HDL: 45 mg/dL (ref >39)
LDL Chol Calc (NIH): 67 mg/dL (ref 0–99)
Triglycerides: 93 mg/dL (ref 0–149)
VLDL Cholesterol Cal: 18 mg/dL (ref 5–40)

## 2022-07-02 LAB — PSA: Prostate Specific Ag, Serum: 6.6 ng/mL — ABNORMAL HIGH (ref 0.0–4.0)

## 2022-07-15 ENCOUNTER — Other Ambulatory Visit: Payer: Self-pay | Admitting: Family Medicine

## 2022-07-15 DIAGNOSIS — I10 Essential (primary) hypertension: Secondary | ICD-10-CM

## 2022-07-16 NOTE — Telephone Encounter (Signed)
Requested Prescriptions  Pending Prescriptions Disp Refills   losartan (COZAAR) 100 MG tablet [Pharmacy Med Name: Losartan Potassium 100 MG Oral Tablet] 100 tablet 2    Sig: TAKE 1 TABLET BY MOUTH DAILY     Cardiovascular:  Angiotensin Receptor Blockers Failed - 07/15/2022  6:34 AM      Failed - Cr in normal range and within 180 days    Creatinine, Ser  Date Value Ref Range Status  07/01/2022 1.52 (H) 0.76 - 1.27 mg/dL Final         Passed - K in normal range and within 180 days    Potassium  Date Value Ref Range Status  07/01/2022 4.0 3.5 - 5.2 mmol/L Final         Passed - Patient is not pregnant      Passed - Last BP in normal range    BP Readings from Last 1 Encounters:  07/01/22 99/62         Passed - Valid encounter within last 6 months    Recent Outpatient Visits           2 weeks ago Routine general medical examination at a health care facility   [place], Megan P, DO   3 months ago BPH with obstruction/lower urinary tract symptoms   [place], Megan P, DO   4 months ago Hypothyroidism, unspecified type   [place], Megan P, DO   6 months ago Hypothyroidism, unspecified type   [place], Megan P, DO   8 months ago Acute cystitis without hematuria   [place], Barb Merino, DO       Future Appointments             In 1 week [place], Barb Merino, DO [place]   In 1 month [place], Barb Merino, DO [place]   In 11 months [place], Herbert Seta, MD [place]

## 2022-07-18 ENCOUNTER — Other Ambulatory Visit: Payer: Self-pay | Admitting: Family Medicine

## 2022-07-18 DIAGNOSIS — N289 Disorder of kidney and ureter, unspecified: Secondary | ICD-10-CM

## 2022-07-18 DIAGNOSIS — R972 Elevated prostate specific antigen [PSA]: Secondary | ICD-10-CM

## 2022-07-25 ENCOUNTER — Ambulatory Visit: Payer: Medicare Other | Admitting: Urology

## 2022-07-29 ENCOUNTER — Encounter: Payer: Self-pay | Admitting: Cardiology

## 2022-07-29 ENCOUNTER — Ambulatory Visit (INDEPENDENT_AMBULATORY_CARE_PROVIDER_SITE_OTHER): Payer: Medicare Other | Admitting: Family Medicine

## 2022-07-29 ENCOUNTER — Encounter: Payer: Self-pay | Admitting: Family Medicine

## 2022-07-29 VITALS — BP 122/70 | HR 70 | Temp 98.3°F | Ht 68.0 in | Wt 195.9 lb

## 2022-07-29 DIAGNOSIS — I10 Essential (primary) hypertension: Secondary | ICD-10-CM

## 2022-07-29 DIAGNOSIS — R972 Elevated prostate specific antigen [PSA]: Secondary | ICD-10-CM

## 2022-07-29 DIAGNOSIS — N289 Disorder of kidney and ureter, unspecified: Secondary | ICD-10-CM | POA: Diagnosis not present

## 2022-07-29 MED ORDER — LOSARTAN POTASSIUM 50 MG PO TABS: 50.0000 mg | ORAL_TABLET | Freq: Every day | ORAL | 1 refills | Status: DC

## 2022-07-29 MED ORDER — VERAPAMIL HCL ER 120 MG PO TBCR: 120.0000 mg | EXTENDED_RELEASE_TABLET | Freq: Every day | ORAL | 1 refills | Status: DC

## 2022-07-29 NOTE — Assessment & Plan Note (Signed)
Under good control on current regimen. Continue current regimen. Continue to monitor. Call with any concerns. Refills given. Labs drawn today.   

## 2022-07-29 NOTE — Progress Notes (Signed)
BP 122/70   Pulse 70   Temp 98.3 F (36.8 C) (Oral)   Ht 5\' 8"  (1.727 m)   Wt 195 lb 14.4 oz (88.9 kg)   SpO2 97%   BMI 29.79 kg/m    Subjective:    Patient ID: Arthur Richardson, male    DOB: 22-Dec-1950, 72 y.o.   MRN: [id number]  HPI: Arthur Richardson is a 73 y.o. male  Chief Complaint  Patient presents with   Hypertension   HYPERTENSION- was not able to cut his verapamil in 1/2 so has been cutting his losartan. Feeling well Hypertension status: better  Satisfied with current treatment? yes Duration of hypertension: chronic BP monitoring frequency:  rarely BP medication side effects:  no Medication compliance: excellent compliance Previous BP meds:verapamil, losartan, lasix Aspirin: yes Recurrent headaches: no Visual changes: no Palpitations: no Dyspnea: no Chest pain: no Lower extremity edema: no Dizzy/lightheaded: no  Relevant past medical, surgical, family and social history reviewed and updated as indicated. Interim medical history since our last visit reviewed. Allergies and medications reviewed and updated.  Review of Systems  Constitutional: Negative.   Respiratory: Negative.    Cardiovascular: Negative.   Gastrointestinal: Negative.   Musculoskeletal: Negative.   Neurological: Negative.   Psychiatric/Behavioral: Negative.      Per HPI unless specifically indicated above     Objective:    BP 122/70   Pulse 70   Temp 98.3 F (36.8 C) (Oral)   Ht 5\' 8"  (1.727 m)   Wt 195 lb 14.4 oz (88.9 kg)   SpO2 97%   BMI 29.79 kg/m   Wt Readings from Last 3 Encounters:  07/29/22 195 lb 14.4 oz (88.9 kg)  07/01/22 192 lb (87.1 kg)  06/13/22 192 lb (87.1 kg)    Physical Exam Vitals and nursing note reviewed.  Constitutional:      General: He is not in acute distress.    Appearance: Normal appearance. He is not ill-appearing, toxic-appearing or diaphoretic.  HENT:     Head: Normocephalic and atraumatic.     Right Ear: External ear normal.      Left Ear: External ear normal.     Nose: Nose normal.     Mouth/Throat:     Mouth: Mucous membranes are moist.     Pharynx: Oropharynx is clear.  Eyes:     General: No scleral icterus.       Right eye: No discharge.        Left eye: No discharge.     Extraocular Movements: Extraocular movements intact.     Conjunctiva/sclera: Conjunctivae normal.     Pupils: Pupils are equal, round, and reactive to light.  Cardiovascular:     Rate and Rhythm: Normal rate and regular rhythm.     Pulses: Normal pulses.     Heart sounds: Normal heart sounds. No murmur heard.    No friction rub. No gallop.  Pulmonary:     Effort: Pulmonary effort is normal. No respiratory distress.     Breath sounds: Normal breath sounds. No stridor. No wheezing, rhonchi or rales.  Chest:     Chest wall: No tenderness.  Musculoskeletal:        General: Normal range of motion.     Cervical back: Normal range of motion and neck supple.  Skin:    General: Skin is warm and dry.     Capillary Refill: Capillary refill takes less than 2 seconds.     Coloration: Skin is not jaundiced  or pale.     Findings: No bruising, erythema, lesion or rash.  Neurological:     General: No focal deficit present.     Mental Status: He is alert and oriented to person, place, and time. Mental status is at baseline.  Psychiatric:        Mood and Affect: Mood normal.        Behavior: Behavior normal.        Thought Content: Thought content normal.        Judgment: Judgment normal.     Results for orders placed or performed in visit on 07/01/22  Comprehensive metabolic panel  Result Value Ref Range   Glucose 88 70 - 99 mg/dL   BUN 24 8 - 27 mg/dL   Creatinine, Ser 1.52 (H) 0.76 - 1.27 mg/dL   eGFR 48 (L) >59 mL/min/1.73   BUN/Creatinine Ratio 16 10 - 24   Sodium 141 134 - 144 mmol/L   Potassium 4.0 3.5 - 5.2 mmol/L   Chloride 104 96 - 106 mmol/L   CO2 19 (L) 20 - 29 mmol/L   Calcium 10.1 8.6 - 10.2 mg/dL   Total Protein 6.7 6.0  - 8.5 g/dL   Albumin 4.5 3.8 - 4.8 g/dL   Globulin, Total 2.2 1.5 - 4.5 g/dL   Albumin/Globulin Ratio 2.0 1.2 - 2.2   Bilirubin Total 0.5 0.0 - 1.2 mg/dL   Alkaline Phosphatase 168 (H) 44 - 121 IU/L   AST 16 0 - 40 IU/L   ALT 11 0 - 44 IU/L  CBC with Differential/Platelet  Result Value Ref Range   WBC 6.4 3.4 - 10.8 x10E3/uL   RBC 4.27 4.14 - 5.80 x10E6/uL   Hemoglobin 13.5 13.0 - 17.7 g/dL   Hematocrit 40.6 37.5 - 51.0 %   MCV 95 79 - 97 fL   MCH 31.6 26.6 - 33.0 pg   MCHC 33.3 31.5 - 35.7 g/dL   RDW 12.4 11.6 - 15.4 %   Platelets 192 150 - 450 x10E3/uL   Neutrophils 56 Not Estab. %   Lymphs 31 Not Estab. %   Monocytes 9 Not Estab. %   Eos 3 Not Estab. %   Basos 1 Not Estab. %   Neutrophils Absolute 3.6 1.4 - 7.0 x10E3/uL   Lymphocytes Absolute 2.0 0.7 - 3.1 x10E3/uL   Monocytes Absolute 0.6 0.1 - 0.9 x10E3/uL   EOS (ABSOLUTE) 0.2 0.0 - 0.4 x10E3/uL   Basophils Absolute 0.1 0.0 - 0.2 x10E3/uL   Immature Granulocytes 0 Not Estab. %   Immature Grans (Abs) 0.0 0.0 - 0.1 x10E3/uL  Lipid Panel w/o Chol/HDL Ratio  Result Value Ref Range   Cholesterol, Total 130 100 - 199 mg/dL   Triglycerides 93 0 - 149 mg/dL   HDL 45 >39 mg/dL   VLDL Cholesterol Cal 18 5 - 40 mg/dL   LDL Chol Calc (NIH) 67 0 - 99 mg/dL  PSA  Result Value Ref Range   Prostate Specific Ag, Serum 6.6 (H) 0.0 - 4.0 ng/mL  TSH  Result Value Ref Range   TSH 0.798 0.450 - 4.500 uIU/mL  Urinalysis, Routine w reflex microscopic  Result Value Ref Range   Specific Gravity, UA 1.025 1.005 - 1.030   pH, UA 5.5 5.0 - 7.5   Color, UA Yellow Yellow   Appearance Ur Clear Clear   Leukocytes,UA Negative Negative   Protein,UA Negative Negative/Trace   Glucose, UA Negative Negative   Ketones, UA Negative Negative   RBC, UA  Negative Negative   Bilirubin, UA Negative Negative   Urobilinogen, Ur 2.0 (H) 0.2 - 1.0 mg/dL   Nitrite, UA Negative Negative   Microscopic Examination Comment   Microalbumin, Urine Waived   Result Value Ref Range   Microalb, Ur Waived 30 (H) 0 - 19 mg/L   Creatinine, Urine Waived 200 10 - 300 mg/dL   Microalb/Creat Ratio <30 <30 mg/g      Assessment & Plan:   Problem List Items Addressed This Visit       Cardiovascular and Mediastinum   Hypertension    Under good control on current regimen. Continue current regimen. Continue to monitor. Call with any concerns. Refills given. Labs drawn today.       Relevant Medications   losartan (COZAAR) 50 MG tablet   verapamil (CALAN-SR) 120 MG CR tablet   Other Visit Diagnoses     Essential hypertension    -  Primary   Relevant Medications   losartan (COZAAR) 50 MG tablet   verapamil (CALAN-SR) 120 MG CR tablet   Elevated PSA       Rechecking labs today. Await results. Treat as needed.   Abnormal kidney function       Rechecking labs today. Await results. Treat as needed.        Follow up plan: Return in about 3 months (around 10/28/2022) for OK to cancel appt 4/23.

## 2022-07-30 ENCOUNTER — Encounter: Payer: Self-pay | Admitting: Neurology

## 2022-07-30 ENCOUNTER — Ambulatory Visit: Payer: Medicare Other | Admitting: Neurology

## 2022-07-30 VITALS — BP 143/79 | HR 64 | Ht 68.0 in | Wt 196.8 lb

## 2022-07-30 DIAGNOSIS — M545 Low back pain, unspecified: Secondary | ICD-10-CM

## 2022-07-30 DIAGNOSIS — G20C Parkinsonism, unspecified: Secondary | ICD-10-CM

## 2022-07-30 DIAGNOSIS — R498 Other voice and resonance disorders: Secondary | ICD-10-CM | POA: Diagnosis not present

## 2022-07-30 LAB — PSA: Prostate Specific Ag, Serum: 4.8 ng/mL — ABNORMAL HIGH (ref 0.0–4.0)

## 2022-07-30 LAB — BASIC METABOLIC PANEL
BUN/Creatinine Ratio: 12 (ref 10–24)
BUN: 11 mg/dL (ref 8–27)
CO2: 21 mmol/L (ref 20–29)
Calcium: 9.7 mg/dL (ref 8.6–10.2)
Chloride: 104 mmol/L (ref 96–106)
Creatinine, Ser: 0.95 mg/dL (ref 0.76–1.27)
Glucose: 85 mg/dL (ref 70–99)
Potassium: 3.9 mmol/L (ref 3.5–5.2)
Sodium: 141 mmol/L (ref 134–144)
eGFR: 85 mL/min/{1.73_m2} (ref >59)

## 2022-07-30 MED ORDER — CARBIDOPA-LEVODOPA 25-100 MG PO TABS: 1.0000 | ORAL_TABLET | Freq: Three times a day (TID) | ORAL | 3 refills | Status: DC

## 2022-07-30 NOTE — Patient Instructions (Signed)
Please take your Sinemet (generic name: carbidopa-levodopa) 25/100 mg three times a day, at 6 AM, 11 AM and 4 PM daily.   Please try to take the medication away from you mealtimes, that is, ideally either one hour before or 2 hours after your meal to ensure optimal absorption. The medication can interfere with the protein content of your meal and trying to the protein in your food and therefore not get fully absorbed.  Common side effects reported are: Nausea, vomiting, sedation, confusion, lightheadedness. Rare side effects include hallucinations, severe nausea or vomiting, diarrhea and significant drop in blood pressure especially when going from lying to standing or from sitting to standing.   As discussed you can schedule your appointment with your new neurologist closer to home from now on.

## 2022-07-30 NOTE — Progress Notes (Signed)
Subjective:    Patient ID: Arthur Richardson is a 72 y.o. male.  HPI    Interim history:   Arthur Richardson is a 72 year old gentleman with an underlying medical history of hypertension, hyperlipidemia, hypothyroidism, and borderline obesity, who presents for follow-up consultation of his left sided parkinsonism. The patient is accompanied by his wife today.  I last saw him on 04/03/22, at which time we talked about his DaTscan results.  He was advised to start Sinemet with gradual titration.  He was supposed to start physical therapy through neurosurgery for back pain.  He indicated that he wanted to see a neurologist closer to home.  His primary care made a referral in the interim.  He has not yet been scheduled with another neurologist.  Today, 07/30/2022: He reports not noticing much in the way of difference but does admit that first thing in the morning he feels better.  He takes his first dose of Sinemet at 6 AM and goes back to bed, gets up around 7 or 8.  He eats breakfast around 8.  He takes his second dose at 10:30 AM and then another dose around 2:30 PM.  He does not take the last dose later as originally planned.  He has not fallen, he has had intermittent constipation and takes Dulcolax as needed.  He does not hydrate well with water, does not drink a whole bottle of water every day, likes to drink chair wine.  He would like to establish with a local neurologist, he has been referred.  He wanted to keep this appointment first.  He feels fatigue especially after his second dose, he does feel some queasiness in terms of nausea after taking the Sinemet.  He has noticed decrease in tremors but balance and walking are about the same.   The patient's allergies, current medications, family history, past medical history, past social history, past surgical history and problem list were reviewed and updated as appropriate.    Previously:    I first met them at the request of his primary care  physician on 01/31/2022, at which time the patient reported an approximately 1 year history of tremors affecting both upper extremities, particularly the left side.  Examination findings were concerning for left-sided parkinsonism.  We talked about lifestyle modification, he was advised to hydrate well with water, limit his alcohol intake and reduce his caffeine intake.  He was counseled on complete smoking cessation.  We talked about the importance of physical activity and getting enough rest.  He was advised to proceed with a DaTscan.  He had a nuclear medicine brain DaTscan on 03/05/2022 and I reviewed the results: IMPRESSION: Mark asymmetric decreased radiotracer activity in the RIGHT striatum is a finding typical of Parkinsonian syndrome pathology.   Of note, DaTSCAN is not diagnostic of Parkinsonian syndromes, which remains a clinical diagnosis. DaTscan is an adjuvant test to aid in the clinical diagnosis of Parkinsonian syndromes.       01/31/22: (He) reports an approximately 1+ year history of left more than right upper extremity tremors, difficulty with dexterity, left more than right, difficulty walking, weakness on the left side, difficulty with his speech, occasional difficulty swallowing.  He reports that symptoms started a few months after he was diagnosed with COVID in 2022.  He had COVID per patient and wife in January 2022, he did not go to the hospital or emergency room, he did not get any antiviral treatment.  He stayed in bed, he felt that he  had pneumonia but did not actually get checked out at the time.  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  He is right-handed.  His wife helps him in his taxidermy business and their son helps out but he has another job and works overnight.  They are 40 year old son lives with them.  He has no family history of Parkinson's disease.  He quit smoking in the early  80s but used to tobacco every day.  He does not drink much in the way of water, maybe less than 1 bottle per day, he likes to drink chair wine, about 2 bottles per day or diet [facility].  He does drink alcohol daily in the form of beer, 1 or 2/day.  He reports a remote history of right-sided Bell's palsy in 2004 or 2005.   He has chronic low back pain and had nerve ablation as I understand.  He also had epidural injections.  He has bilateral hip pain and has received a left hip injection.   I reviewed your office note from 11/15/2021.  He had a TSH checked at the time, it was normal at 1.55.   He has previously been followed by Dr. Melrose Richardson in neurology at [facility] clinic I was able to review office visit dates but not the encounter note, last seen on 10/11/2020. It looks like he had an EMG and nerve conduction velocity test at the time.  He had an office visit with Dr. Melrose Richardson on 09/21/2020 and I was able to review the office note.  Patient was on gabapentin at the time and reported weakness in the left arm and leg.  Reportedly he had a brain MRI on 09/05/2020 and I reviewed the result in Dr. Lannie Richardson note: Impression: No evidence of acute intracranial abnormality.  Mild cerebral atrophy and chronic small vessel ischemic disease.  Paranasal sinus disease, as described.     He also had an MRI of the lumbar spine on 08/15/2020 with impression: Right L4-5 foraminal protrusion abutting the exiting L4 nerve root with mild bilateral neural foraminal narrowing.  Mild L3-4 spinal canal narrowing.  Mild left L2-3 and bilateral L3-4 neuroforaminal narrowing.  Disc bulge abutment of the exiting right L3 nerve root.     He had an MRI of the left shoulder on 08/15/2020 and I reviewed the results: IMPRESSION:  Mild-to-moderate appearing rotator cuff tendinopathy without tear.    Moderate acromioclavicular osteoarthritis.    Small volume of subacromial/subdeltoid fluid compatible with  bursitis.     His Past Medical  History Is Significant For: Past Medical History:  Diagnosis Date   Hyperlipidemia    Hypertension     His Past Surgical History Is Significant For: Past Surgical History:  Procedure Laterality Date   LITHOTRIPSY  09/1998 & 07/29/1999   Due to nephrolithiasis   PROSTATE SURGERY     Biopsy= Negative   TONSILLECTOMY      His Family History Is Significant For: Family History  Problem Relation Age of Onset   Stroke Father    Cancer Sister        Pancreatic Cancer   Parkinson's disease Neg Hx    Tremor Neg Hx     His Social History Is Significant For: Social History   Socioeconomic History   Marital status: Married    Spouse name: Not on file   Number of children: Not on file   Years  of education: Not on file   Highest education level: Not on file  Occupational History   Not on file  Tobacco Use   Smoking status: Former    Types: Cigarettes    Quit date: 01/31/1980    Years since quitting: 42.5    Passive exposure: Past   Smokeless tobacco: Current    Types: Chew  Vaping Use   Vaping Use: Never used  Substance and Sexual Activity   Alcohol use: Yes    Alcohol/week: 14.0 standard drinks of alcohol    Types: 14 Cans of beer per week   Drug use: No   Sexual activity: Yes  Other Topics Concern   Not on file  Social History Narrative   Not on file   Social Determinants of Health   Financial Resource Strain: Low Risk  (06/11/2022)   Overall Financial Resource Strain (CARDIA)    Difficulty of Paying Living Expenses: Not hard at all  Food Insecurity: No Food Insecurity (06/11/2022)   Hunger Vital Sign    Worried About Running Out of Food in the Last Year: Never true    Ran Out of Food in the Last Year: Never true  Transportation Needs: No Transportation Needs (06/11/2022)   PRAPARE - Hydrologist (Medical): No    Lack of Transportation (Non-Medical): No  Physical Activity: Inactive (06/11/2022)   Exercise Vital Sign    Days of  Exercise per Week: 0 days    Minutes of Exercise per Session: 0 min  Stress: No Stress Concern Present (06/11/2022)   [place]    Feeling of Stress : Not at all  Social Connections: Moderately Integrated (06/11/2022)   Social Connection and Isolation Panel [NHANES]    Frequency of Communication with Friends and Family: Three times a week    Frequency of Social Gatherings with Friends and Family: Twice a week    Attends Religious Services: More than 4 times per year    Active Member of [employer] or Organizations: No    Attends Archivist Meetings: Never    Marital Status: Married    His Allergies Are:  Allergies  Allergen Reactions   Penicillins   :   His Current Medications Are:  Outpatient Encounter Medications as of 07/30/2022  Medication Sig   aspirin EC 81 MG tablet Take 81 mg by mouth daily.   atorvastatin (LIPITOR) 40 MG tablet Take 1 tablet (40 mg total) by mouth daily.   furosemide (LASIX) 40 MG tablet Take 1 tablet (40 mg total) by mouth daily as needed.   levothyroxine (SYNTHROID) 50 MCG tablet Take 1 tablet (50 mcg total) by mouth daily before breakfast.   losartan (COZAAR) 50 MG tablet Take 1 tablet (50 mg total) by mouth daily.   Omega-3 1000 MG CAPS Take 1 capsule by mouth daily.   tamsulosin (FLOMAX) 0.4 MG CAPS capsule Take 1 capsule (0.4 mg total) by mouth daily.   verapamil (CALAN-SR) 120 MG CR tablet Take 1 tablet (120 mg total) by mouth at bedtime.   [DISCONTINUED] carbidopa-levodopa (SINEMET IR) 25-100 MG tablet Take 1 tablet by mouth 3 (three) times daily. Follow instructions provided separately in writing.   carbidopa-levodopa (SINEMET IR) 25-100 MG tablet Take 1 tablet by mouth 3 (three) times daily. Follow instructions provided separately in writing.   No facility-administered encounter medications on file as of 07/30/2022.  :  Review of Systems:  Out of a complete 14  point review of  systems, all are reviewed and negative with the exception of these symptoms as listed below:   Review of Systems  Neurological:        Taking CL 25/100 TID, works with tremor, not walking.   Asking for once daily dosing medication.  Asking if injection for shoulder contraindicated (steroid) with C/L.      Objective:  Neurological Exam  Physical Exam Physical Examination:   Vitals:   07/30/22 1009  BP: (!) 143/79  Pulse: 64    General Examination: The patient is a very pleasant 72 y.o. male in no acute distress. He appears well-developed and well-nourished and well groomed.   HEENT: Normocephalic, atraumatic, pupils are equal, round and reactive to light, extraocular tracking is mild to moderately impaired, corrective eyeglasses in place.  Hearing is grossly intact.  Face is very slightly asymmetric, mild facial masking noted, mild to moderate nuchal rigidity noted, no facial tremor, no neck tremor.  He has moderate to severe hypophonia with fluctuating voice and raspiness noted, slight dysarthria at times, all stable.  He has no voice tremor. Neck with decreased passive range of motion.  No carotid bruits.  Mild to moderate mouth dryness noted, tongue protrudes centrally.    Chest: Clear to auscultation without wheezing, rhonchi or crackles noted.   Heart: S1+S2+0, regular and normal without murmurs, rubs or gallops noted.    Abdomen: Soft, non-tender and non-distended.   Extremities: There is no pitting edema in the distal lower extremities bilaterally.    Skin: Warm and dry without trophic changes noted.    Musculoskeletal: exam reveals no obvious joint deformities but reports low back pain.    Neurologically:  Mental status: The patient is awake, alert and oriented in all 4 spheres. His immediate and remote memory, attention, language skills and fund of knowledge are appropriate. There is no evidence of aphasia, agnosia, apraxia or anomia. Speech as above.  Thought process is  linear. Mood is normal and affect is normal.  Cranial nerves II - XII are as described above under HEENT exam.  Motor exam: Normal bulk, global strength normal, increased tone in both upper extremities with left upper extremity cogwheeling noted, he has an intermittent resting tremor in the right upper extremity.      (On 01/31/22: On Archimedes spiral drawing he has minimal trembling with the right hand, moderate trembling with the left hand, handwriting with the right hand is slightly tremulous, legible, not particularly micrographic but on the smaller side at the end.)     He has an upper extremity postural tremor, no significant action tremor in either hand or arm and no intention tremor, no lower extremity tremor.     Fine motor skills and coordination: He has moderate difficulty with the left upper and lower extremities, better on the right.   Cerebellar testing: No dysmetria or intention tremor. There is no truncal or gait ataxia.  Finger-to-nose is doable without dysmetria but he has difficulty with heel-to-shin bilaterally due to hip pain bilaterally. Sensory exam: intact to light touch in the upper and lower extremities.    Gait, station and balance: He stands with difficulty and pushes himself up.  Posture is stooped for age.  He walks with decreased stride length and pace, no telltale shuffling, decreased arm swing bilaterally, left more than right and slight difficulty with turns, no walking aid.     Assessment and Plan:    In summary, Arthur Richardson is a very pleasant 72 year old  male with an underlying medical history of hypertension, hyperlipidemia, hypothyroidism, and borderline obesity, who presents for follow-up consultation of his parkinsonism of at least 18 months' duration.  History and examination findings are in keeping with left-sided parkinsonism.  His DaTscan from 03/05/2022 showed asymmetric decreased radiotracer activity, with significantly decreased uptake on the  right, and, as such was supportive of an underlying parkinsonian syndrome.  He started levodopa therapy in December 2023.  He is taking 1 pill 3 times daily but not on a scheduled basis, typically takes all 3 doses before 3 PM.  He is advised to take the Sinemet 3 times a day but take it at 6 AM, 11 AM and 4 PM.  He is advised to take the medicine about an hour before he eats or 2 hours after he eats if possible.  He is advised to follow-up with his new neurologist as they would like to see somebody closer to home, which is understandable.  We will not make a follow-up appointment here for that reason.  I have renewed his Sinemet prescription to bridge his medication over.  He was given written instructions as well again today.  We talked about his other symptoms including fatigue and exhaustion.  These could tie-in with Parkinson's disease.  He has back pain and arthritis issues and is planning to get a steroid shot soon as I understand.  We talked about the importance of being proactive about constipation.  He is encouraged to drink more water and scale back on his soda intake.  Caffeine can also trigger his tremors.  I answered all their questions today and the patient and his wife were in agreement with scheduling an appointment with his new/local neurologist. I spent 30 minutes in total face-to-face time and in reviewing records during pre-charting, more than 50% of which was spent in counseling and coordination of care, reviewing test results, reviewing medications and treatment regimen and/or in discussing or reviewing the diagnosis of primary parkinsonism, the prognosis and treatment options. Pertinent laboratory and imaging test results that were available during this visit with the patient were reviewed by me and considered in my medical decision making (see chart for details).

## 2022-08-07 ENCOUNTER — Ambulatory Visit: Payer: Medicare Other | Admitting: Neurology

## 2022-08-13 DIAGNOSIS — M48061 Spinal stenosis, lumbar region without neurogenic claudication: Secondary | ICD-10-CM | POA: Diagnosis not present

## 2022-08-13 DIAGNOSIS — M7542 Impingement syndrome of left shoulder: Secondary | ICD-10-CM | POA: Diagnosis not present

## 2022-08-13 DIAGNOSIS — M1612 Unilateral primary osteoarthritis, left hip: Secondary | ICD-10-CM | POA: Diagnosis not present

## 2022-08-20 ENCOUNTER — Encounter: Payer: Medicare Other | Admitting: Family Medicine

## 2022-08-20 DIAGNOSIS — M1612 Unilateral primary osteoarthritis, left hip: Secondary | ICD-10-CM | POA: Diagnosis not present

## 2022-09-17 DIAGNOSIS — M5126 Other intervertebral disc displacement, lumbar region: Secondary | ICD-10-CM | POA: Diagnosis not present

## 2022-09-17 DIAGNOSIS — M5416 Radiculopathy, lumbar region: Secondary | ICD-10-CM | POA: Diagnosis not present

## 2022-09-17 DIAGNOSIS — M48061 Spinal stenosis, lumbar region without neurogenic claudication: Secondary | ICD-10-CM | POA: Diagnosis not present

## 2022-10-22 ENCOUNTER — Other Ambulatory Visit: Payer: Self-pay | Admitting: Family Medicine

## 2022-10-22 DIAGNOSIS — E039 Hypothyroidism, unspecified: Secondary | ICD-10-CM

## 2022-10-23 NOTE — Telephone Encounter (Signed)
Requested Prescriptions  Pending Prescriptions Disp Refills   levothyroxine (SYNTHROID) 50 MCG tablet [Pharmacy Med Name: MYLAN-LEVOTHYROX TABLET] 100 tablet 2    Sig: TAKE 1 TABLET BY MOUTH DAILY  BEFORE BREAKFAST     Endocrinology:  Hypothyroid Agents Passed - 10/22/2022  4:53 AM      Passed - TSH in normal range and within 360 days    TSH  Date Value Ref Range Status  07/01/2022 0.798 0.450 - 4.500 uIU/mL Final         Passed - Valid encounter within last 12 months    Recent Outpatient Visits           2 months ago Essential hypertension   [facility] [place], Megan P, DO   3 months ago Routine general medical examination at a health care facility   [facility], Megan P, DO   6 months ago BPH with obstruction/lower urinary tract symptoms   [facility] [place], Megan P, DO   8 months ago Hypothyroidism, unspecified type   [facility] [place], Megan P, DO   10 months ago Hypothyroidism, unspecified type   [place] [facility] [place], [place], DO       Future Appointments             In 5 days Laural Benes, Oralia Rud, DO [place] [facility]   In 7 months Richardo Hanks, Laurette Schimke, MD [facility] [place]             verapamil (CALAN-SR) 120 MG CR tablet [Pharmacy Med Name: VERAPAMIL 120MG  SR TABLET 12H] 100 tablet 2    Sig: TAKE 1 TABLET BY MOUTH AT  BEDTIME     Cardiovascular: Calcium Channel Blockers 3 Failed - 10/22/2022  4:53 AM      Failed - Last BP in normal range    BP Readings from Last 1 Encounters:  07/30/22 (!) 143/79         Passed - ALT in normal range and within 360 days    ALT  Date Value Ref Range Status  07/01/2022 11 0 - 44 IU/L Final         Passed - AST in normal range and within 360 days    AST  Date Value Ref Range Status  07/01/2022 16 0 - 40 IU/L Final         Passed - Cr in normal range  and within 360 days    Creatinine, Ser  Date Value Ref Range Status  07/29/2022 0.95 0.76 - 1.27 mg/dL Final         Passed - Last Heart Rate in normal range    Pulse Readings from Last 1 Encounters:  07/30/22 64         Passed - Valid encounter within last 6 months    Recent Outpatient Visits           2 months ago Essential hypertension   [facility] [place], Megan P, DO   3 months ago Routine general medical examination at a health care facility   [facility], [facility], DO   6 months ago BPH with obstruction/lower urinary tract symptoms   [place] [facility] [place], Megan P, DO   8 months ago Hypothyroidism, unspecified type   [facility] [place], Megan P, DO   10 months ago  Hypothyroidism, unspecified type   [place] [facility] [place], Oralia Rud, DO       Future Appointments             In 5 days Laural Benes, Oralia Rud, DO  [facility], [facility]   In 7 months Richardo Hanks, Laurette Schimke, MD [facility]

## 2022-10-28 ENCOUNTER — Encounter: Payer: Self-pay | Admitting: Family Medicine

## 2022-10-28 ENCOUNTER — Ambulatory Visit (INDEPENDENT_AMBULATORY_CARE_PROVIDER_SITE_OTHER): Payer: Medicare Other | Admitting: Family Medicine

## 2022-10-28 VITALS — BP 132/82 | HR 57 | Temp 97.9°F | Wt 194.0 lb

## 2022-10-28 DIAGNOSIS — E7849 Other hyperlipidemia: Secondary | ICD-10-CM

## 2022-10-28 DIAGNOSIS — I10 Essential (primary) hypertension: Secondary | ICD-10-CM

## 2022-10-28 DIAGNOSIS — E039 Hypothyroidism, unspecified: Secondary | ICD-10-CM

## 2022-10-28 DIAGNOSIS — N401 Enlarged prostate with lower urinary tract symptoms: Secondary | ICD-10-CM

## 2022-10-28 DIAGNOSIS — N138 Other obstructive and reflux uropathy: Secondary | ICD-10-CM | POA: Diagnosis not present

## 2022-10-28 MED ORDER — ATORVASTATIN CALCIUM 40 MG PO TABS: 40.0000 mg | ORAL_TABLET | Freq: Every day | ORAL | 2 refills | Status: DC

## 2022-10-28 MED ORDER — VERAPAMIL HCL ER 120 MG PO TBCR: 120.0000 mg | EXTENDED_RELEASE_TABLET | Freq: Every day | ORAL | 1 refills | Status: DC

## 2022-10-28 NOTE — Progress Notes (Signed)
BP 132/82   Pulse (!) 57   Temp 97.9 F (36.6 C) (Oral)   Wt 194 lb (88 kg)   SpO2 99%   BMI 29.50 kg/m    Subjective:    Patient ID: Arthur Richardson, male    DOB: July 02, 1950, 72 y.o.   MRN: [id number]  HPI: Arthur Richardson is a 72 y.o. male  Chief Complaint  Patient presents with   Hypertension   Hyperlipidemia   Hypothyroidism   HYPERTENSION / HYPERLIPIDEMIA Satisfied with current treatment? yes Duration of hypertension: chronic BP monitoring frequency: not checking BP medication side effects: no Past BP meds: losartan, lasix, verapamil Duration of hyperlipidemia: chronic Cholesterol medication side effects: no Cholesterol supplements: fish oil Past cholesterol medications: atorvastatin Medication compliance: excellent compliance Aspirin: yes Recent stressors: no Recurrent headaches: no Visual changes: no Palpitations: no Dyspnea: no Chest pain: no Lower extremity edema: no Dizzy/lightheaded: no  HYPOTHYROIDISM Thyroid control status:controlled Satisfied with current treatment? yes Medication side effects: no Medication compliance: excellent compliance Etiology of hypothyroidism:  Recent dose adjustment:no Fatigue: no Cold intolerance: no Heat intolerance: no Weight gain: no Weight loss: no Constipation: no Diarrhea/loose stools: no Palpitations: no Lower extremity edema: no Anxiety/depressed mood: no  Relevant past medical, surgical, family and social history reviewed and updated as indicated. Interim medical history since our last visit reviewed. Allergies and medications reviewed and updated.  Review of Systems  Constitutional: Negative.   Respiratory: Negative.    Cardiovascular: Negative.   Gastrointestinal: Negative.   Musculoskeletal:  Positive for arthralgias, gait problem and myalgias. Negative for back pain, joint swelling, neck pain and neck stiffness.  Skin: Negative.   Neurological:  Positive for tremors, speech  difficulty and weakness. Negative for dizziness, seizures, syncope, facial asymmetry, light-headedness, numbness and headaches.  Psychiatric/Behavioral: Negative.      Per HPI unless specifically indicated above     Objective:    BP 132/82   Pulse (!) 57   Temp 97.9 F (36.6 C) (Oral)   Wt 194 lb (88 kg)   SpO2 99%   BMI 29.50 kg/m   Wt Readings from Last 3 Encounters:  10/28/22 194 lb (88 kg)  07/30/22 196 lb 12.8 oz (89.3 kg)  07/29/22 195 lb 14.4 oz (88.9 kg)    Physical Exam Vitals and nursing note reviewed.  Constitutional:      General: He is not in acute distress.    Appearance: Normal appearance. He is not ill-appearing, toxic-appearing or diaphoretic.  HENT:     Head: Normocephalic and atraumatic.     Right Ear: External ear normal.     Left Ear: External ear normal.     Nose: Nose normal.     Mouth/Throat:     Mouth: Mucous membranes are moist.     Pharynx: Oropharynx is clear.  Eyes:     General: No scleral icterus.       Right eye: No discharge.        Left eye: No discharge.     Extraocular Movements: Extraocular movements intact.     Conjunctiva/sclera: Conjunctivae normal.     Pupils: Pupils are equal, round, and reactive to light.  Cardiovascular:     Rate and Rhythm: Normal rate and regular rhythm.     Pulses: Normal pulses.     Heart sounds: Normal heart sounds. No murmur heard.    No friction rub. No gallop.  Pulmonary:     Effort: Pulmonary effort is normal. No respiratory distress.  Breath sounds: Normal breath sounds. No stridor. No wheezing, rhonchi or rales.  Chest:     Chest wall: No tenderness.  Musculoskeletal:        General: Normal range of motion.     Cervical back: Normal range of motion and neck supple.  Skin:    General: Skin is warm and dry.     Capillary Refill: Capillary refill takes less than 2 seconds.     Coloration: Skin is not jaundiced or pale.     Findings: No bruising, erythema, lesion or rash.  Neurological:      General: No focal deficit present.     Mental Status: He is alert and oriented to person, place, and time. Mental status is at baseline.  Psychiatric:        Mood and Affect: Mood normal.        Behavior: Behavior normal.        Thought Content: Thought content normal.        Judgment: Judgment normal.     Results for orders placed or performed in visit on 07/29/22  PSA  Result Value Ref Range   Prostate Specific Ag, Serum 4.8 (H) 0.0 - 4.0 ng/mL  Basic metabolic panel  Result Value Ref Range   Glucose 85 70 - 99 mg/dL   BUN 11 8 - 27 mg/dL   Creatinine, Ser 1.61 0.76 - 1.27 mg/dL   eGFR 85 >09 UE/AVW/0.98   BUN/Creatinine Ratio 12 10 - 24   Sodium 141 134 - 144 mmol/L   Potassium 3.9 3.5 - 5.2 mmol/L   Chloride 104 96 - 106 mmol/L   CO2 21 20 - 29 mmol/L   Calcium 9.7 8.6 - 10.2 mg/dL      Assessment & Plan:   Problem List Items Addressed This Visit       Cardiovascular and Mediastinum   Hypertension    Under good control on current regimen. Continue current regimen. Continue to monitor. Call with any concerns. Refills given. Labs drawn today.        Relevant Medications   atorvastatin (LIPITOR) 40 MG tablet   verapamil (CALAN-SR) 120 MG CR tablet   Other Relevant Orders   CBC with Differential/Platelet   Comprehensive metabolic panel     Endocrine   Hypothyroidism - Primary    Rechecking labs today. Await results. Treat as needed.       Relevant Orders   CBC with Differential/Platelet   Comprehensive metabolic panel   TSH     Genitourinary   BPH with obstruction/lower urinary tract symptoms    Under good control on current regimen. Continue current regimen. Continue to monitor. Call with any concerns. Refills given. Labs drawn today.       Relevant Orders   CBC with Differential/Platelet   Comprehensive metabolic panel   PSA     Other   Hyperlipidemia    Under good control on current regimen. Continue current regimen. Continue to monitor.  Call with any concerns. Refills given. Labs drawn today.       Relevant Medications   atorvastatin (LIPITOR) 40 MG tablet   verapamil (CALAN-SR) 120 MG CR tablet   Other Relevant Orders   CBC with Differential/Platelet   Comprehensive metabolic panel   Lipid Panel w/o Chol/HDL Ratio     Follow up plan: Return in about 4 months (around 02/28/2023).

## 2022-10-28 NOTE — Assessment & Plan Note (Signed)
Rechecking labs today. Await results. Treat as needed.  °

## 2022-10-28 NOTE — Assessment & Plan Note (Signed)
Under good control on current regimen. Continue current regimen. Continue to monitor. Call with any concerns. Refills given. Labs drawn today.   

## 2022-10-29 ENCOUNTER — Other Ambulatory Visit: Payer: Self-pay

## 2022-10-29 DIAGNOSIS — Z125 Encounter for screening for malignant neoplasm of prostate: Secondary | ICD-10-CM

## 2022-10-29 DIAGNOSIS — N401 Enlarged prostate with lower urinary tract symptoms: Secondary | ICD-10-CM

## 2022-10-29 LAB — CBC WITH DIFFERENTIAL/PLATELET
Basophils Absolute: 0.1 10*3/uL (ref 0.0–0.2)
Basos: 1 %
EOS (ABSOLUTE): 0.2 10*3/uL (ref 0.0–0.4)
Eos: 2 %
Hematocrit: 41.6 % (ref 37.5–51.0)
Hemoglobin: 13.5 g/dL (ref 13.0–17.7)
Immature Grans (Abs): 0 10*3/uL (ref 0.0–0.1)
Immature Granulocytes: 0 %
Lymphocytes Absolute: 2.6 10*3/uL (ref 0.7–3.1)
Lymphs: 35 %
MCH: 31.2 pg (ref 26.6–33.0)
MCHC: 32.5 g/dL (ref 31.5–35.7)
MCV: 96 fL (ref 79–97)
Monocytes Absolute: 0.5 10*3/uL (ref 0.1–0.9)
Monocytes: 7 %
Neutrophils Absolute: 4.1 10*3/uL (ref 1.4–7.0)
Neutrophils: 55 %
Platelets: 257 10*3/uL (ref 150–450)
RBC: 4.33 x10E6/uL (ref 4.14–5.80)
RDW: 12.9 % (ref 11.6–15.4)
WBC: 7.5 10*3/uL (ref 3.4–10.8)

## 2022-10-29 LAB — COMPREHENSIVE METABOLIC PANEL
ALT: 10 IU/L (ref 0–44)
AST: 14 IU/L (ref 0–40)
Albumin: 4.2 g/dL (ref 3.8–4.8)
Alkaline Phosphatase: 135 IU/L — ABNORMAL HIGH (ref 44–121)
BUN/Creatinine Ratio: 13 (ref 10–24)
BUN: 14 mg/dL (ref 8–27)
Bilirubin Total: 0.5 mg/dL (ref 0.0–1.2)
CO2: 24 mmol/L (ref 20–29)
Calcium: 9.4 mg/dL (ref 8.6–10.2)
Chloride: 101 mmol/L (ref 96–106)
Creatinine, Ser: 1.1 mg/dL (ref 0.76–1.27)
Globulin, Total: 2.1 g/dL (ref 1.5–4.5)
Glucose: 89 mg/dL (ref 70–99)
Potassium: 4.3 mmol/L (ref 3.5–5.2)
Sodium: 139 mmol/L (ref 134–144)
Total Protein: 6.3 g/dL (ref 6.0–8.5)
eGFR: 71 mL/min/{1.73_m2} (ref >59)

## 2022-10-29 LAB — LIPID PANEL W/O CHOL/HDL RATIO
Cholesterol, Total: 120 mg/dL (ref 100–199)
HDL: 49 mg/dL (ref >39)
LDL Chol Calc (NIH): 58 mg/dL (ref 0–99)
Triglycerides: 59 mg/dL (ref 0–149)
VLDL Cholesterol Cal: 13 mg/dL (ref 5–40)

## 2022-10-29 LAB — TSH: TSH: 0.951 u[IU]/mL (ref 0.450–4.500)

## 2022-10-29 LAB — PSA: Prostate Specific Ag, Serum: 6.8 ng/mL — ABNORMAL HIGH (ref 0.0–4.0)

## 2022-11-11 ENCOUNTER — Telehealth: Payer: Self-pay | Admitting: Family Medicine

## 2022-11-11 NOTE — Telephone Encounter (Signed)
I do not.

## 2022-11-11 NOTE — Telephone Encounter (Signed)
Called and informed pt wife of what provider stated.

## 2022-11-11 NOTE — Telephone Encounter (Signed)
Copied from CRM [id number]. Topic: General - Inquiry >> Nov 11, 2022 11:09 AM Payton Doughty wrote: Reason for CRM: wife would like to know if you have any information on "men's liberty", which is an alternative to diapers. They saw the commercial, but unsure how to get.  Do you know anything about this?

## 2022-12-10 NOTE — Telephone Encounter (Signed)
Copied from CRM [id number]. Topic: General - Other >> Dec 10, 2022  8:20 AM Franchot Heidelberg wrote: Reason for CRM: Arthur Richardson from [facility] called to see if the fax was received from August 9th. The manufacturer has changed for the levothyroxine and they need PCP approval. Please advise  She will refax document, fax confirmed.   Best contact: [phone] (provider line, verbal is accepted as well. Medication currently on hold until they receive correspondence).

## 2022-12-11 DIAGNOSIS — M25551 Pain in right hip: Secondary | ICD-10-CM | POA: Diagnosis not present

## 2022-12-11 DIAGNOSIS — E538 Deficiency of other specified B group vitamins: Secondary | ICD-10-CM | POA: Diagnosis not present

## 2022-12-11 DIAGNOSIS — G4752 REM sleep behavior disorder: Secondary | ICD-10-CM | POA: Diagnosis not present

## 2022-12-11 DIAGNOSIS — G20B1 Parkinson's disease with dyskinesia, without mention of fluctuations: Secondary | ICD-10-CM | POA: Diagnosis not present

## 2022-12-11 DIAGNOSIS — M25552 Pain in left hip: Secondary | ICD-10-CM | POA: Diagnosis not present

## 2022-12-18 ENCOUNTER — Telehealth: Payer: Self-pay | Admitting: Family Medicine

## 2022-12-18 NOTE — Telephone Encounter (Unsigned)
Copied from CRM [id number]. Topic: General - Other >> Dec 18, 2022  9:57 AM Franchot Heidelberg wrote: Reason for CRM: Pt's wife called regarding the patient's B12 injections, wants to know when pt can get scheduled.   Best contact: [phone]

## 2022-12-19 ENCOUNTER — Encounter: Payer: Self-pay | Admitting: Family Medicine

## 2022-12-19 ENCOUNTER — Other Ambulatory Visit: Payer: Self-pay | Admitting: Family Medicine

## 2022-12-19 DIAGNOSIS — E538 Deficiency of other specified B group vitamins: Secondary | ICD-10-CM

## 2022-12-19 MED ORDER — CYANOCOBALAMIN 1000 MCG/ML IJ SOLN
1000.0000 ug | INTRAMUSCULAR | Status: DC
  Administered 2023-02-10 – 2023-04-16 (×3): 1000 ug via INTRAMUSCULAR

## 2022-12-19 MED ORDER — CYANOCOBALAMIN 1000 MCG/ML IJ SOLN
1000.0000 ug | INTRAMUSCULAR | Status: AC
  Administered 2022-12-20 – 2023-01-10 (×3): 1000 ug via INTRAMUSCULAR

## 2022-12-19 NOTE — Telephone Encounter (Signed)
Called and scheduled patient a nurse visit for tomorrow 12/20/2022 @ 9:40 am.

## 2022-12-19 NOTE — Telephone Encounter (Signed)
Note from Dr. Sherryll Burger reviewed. OK for him to get his B12 shots given here. Orders placed. Weekly x4 weeks, then 1x a month after that. OK to schedule nurse visit.

## 2022-12-19 NOTE — Telephone Encounter (Signed)
FYI: Pt wife is calling back regarding B12 shot for pt. I did explain to pt that the message was sent back to the PCP and that she will be getting a response.

## 2022-12-19 NOTE — Telephone Encounter (Signed)
Called patient's wife she stated that there were already orders out there regarding the patient getting B12 shots that Dr. Sherryll Burger at [facility] done blood work on him last week.  The results showed that his B12 was low and would need injections, they gave them the choice to get them at [facility], home, or his PCP.  She stated they chose his PCP.  Please advise.

## 2022-12-20 ENCOUNTER — Ambulatory Visit (INDEPENDENT_AMBULATORY_CARE_PROVIDER_SITE_OTHER): Payer: Medicare Other

## 2022-12-20 DIAGNOSIS — E538 Deficiency of other specified B group vitamins: Secondary | ICD-10-CM | POA: Diagnosis not present

## 2022-12-23 ENCOUNTER — Ambulatory Visit: Payer: Medicare Other | Admitting: Family Medicine

## 2022-12-23 ENCOUNTER — Other Ambulatory Visit: Payer: Self-pay | Admitting: Family Medicine

## 2022-12-23 DIAGNOSIS — E039 Hypothyroidism, unspecified: Secondary | ICD-10-CM

## 2022-12-24 NOTE — Telephone Encounter (Signed)
Requested Prescriptions  Refused Prescriptions Disp Refills   levothyroxine (SYNTHROID) 50 MCG tablet [Pharmacy Med Name: ALVOGEN-LEVOTHYROXINE TAB 50MCG] 60 tablet 5    Sig: TAKE 1 TABLET BY MOUTH DAILY  BEFORE BREAKFAST     Endocrinology:  Hypothyroid Agents Passed - 12/23/2022 11:12 AM      Passed - TSH in normal range and within 360 days    TSH  Date Value Ref Range Status  10/28/2022 0.951 0.450 - 4.500 uIU/mL Final         Passed - Valid encounter within last 12 months    Recent Outpatient Visits           1 month ago Hypothyroidism, unspecified type   [place] [facility] [place], Megan P, DO   4 months ago Essential hypertension   [place] [facility] [place], Megan P, DO   5 months ago Routine general medical examination at a health care facility   [facility], Megan P, DO   8 months ago BPH with obstruction/lower urinary tract symptoms   [place] [facility] [place], Megan P, DO   10 months ago Hypothyroidism, unspecified type   [place] [facility] Dorcas Carrow, DO       Future Appointments             In 2 months Laural Benes, Oralia Rud, DO [place] [facility]   In 5 months Richardo Hanks, Laurette Schimke, MD [facility] [place]

## 2022-12-26 ENCOUNTER — Ambulatory Visit: Payer: Medicare Other

## 2022-12-26 DIAGNOSIS — M6281 Muscle weakness (generalized): Secondary | ICD-10-CM | POA: Diagnosis not present

## 2022-12-26 DIAGNOSIS — R2681 Unsteadiness on feet: Secondary | ICD-10-CM | POA: Diagnosis not present

## 2022-12-26 DIAGNOSIS — R278 Other lack of coordination: Secondary | ICD-10-CM | POA: Insufficient documentation

## 2022-12-26 DIAGNOSIS — R262 Difficulty in walking, not elsewhere classified: Secondary | ICD-10-CM | POA: Insufficient documentation

## 2022-12-26 NOTE — Therapy (Signed)
OUTPATIENT PHYSICAL THERAPY NEURO EVALUATION   Patient Name: Arthur Richardson MRN: [id number] DOB:1950-12-26, 72 y.o., male Today's Date: 12/26/2022   PCP: Dorcas Carrow, DO  REFERRING PROVIDER: Lonell Face, MD   END OF SESSION:  PT End of Session - 12/26/22 1631     Visit Number 1    Number of Visits 25    Date for PT Re-Evaluation 03/20/23    PT Start Time 1102    PT Stop Time 1145    PT Time Calculation (min) 43 min    Equipment Utilized During Treatment Gait belt    Activity Tolerance Patient tolerated treatment well    Behavior During Therapy WFL for tasks assessed/performed             Past Medical History:  Diagnosis Date   Hyperlipidemia    Hypertension    Past Surgical History:  Procedure Laterality Date   LITHOTRIPSY  09/1998 & 07/29/1999   Due to nephrolithiasis   PROSTATE SURGERY     Biopsy= Negative   TONSILLECTOMY     Patient Active Problem List   Diagnosis Date Noted   B12 deficiency 12/19/2022   Primary Parkinson's disease 07/01/2022   Senile purpura (HCC) 07/01/2022   Chronic cluster headache, not intractable 07/30/2021   Peripheral edema 07/30/2021   Dyspnea on exertion 06/08/2020   Trigger point of left shoulder region 08/21/2017   BPH with obstruction/lower urinary tract symptoms 02/20/2017   Advanced care planning/counseling discussion 02/20/2017   DDD (degenerative disc disease), lumbar 08/21/2016   Nephrolithiasis 02/01/2016   Barrett's esophagus with esophagitis 10/11/2014   Trigeminal neuralgia 10/11/2014   Hypertension 10/11/2014   Hyperlipidemia 10/11/2014   Hypothyroidism 10/11/2014   Dysphagia 08/04/2014    ONSET DATE: November 2023  REFERRING DIAG: 20.A1 (ICD-10-CM) - Parkinson's disease   THERAPY DIAG:  Difficulty in walking, not elsewhere classified  Muscle weakness (generalized)  Unsteadiness on feet  Other lack of coordination  Rationale for Evaluation and Treatment: Rehabilitation  SUBJECTIVE:                                                                                                                                                                                              SUBJECTIVE STATEMENT: Pt presents for PT eval due to impairments related to PD.   Pt accompanied by: self  PERTINENT HISTORY:   The pt is a pleasant 72 yo male referred to PT for impairments due to Parkinson's. He ambulates to session using RW.  Pt's primary concerns include decreased strength and poor standing endurance. Pt also reports if he sits for too long he can  have pain in BLE and hips and his legs can "lock up." Pt also with decreased endurance with ambulation, poor balance, and decreased UE/grip strength. Pt using RW today but uses a cane at home for shorter distances. Pt has difficulty with ADLs due to PD symptoms and his wife typically helps him. He has trouble with bed mobility, specifically rolling over, and difficulty with transfers. Pt avoids stairs. Pt reports freezing episodes. He says he was told to stop PD medications last neurology visit due to severe vertigo. He has follow-up with Dr. Sherryll Burger in December to try new PD medication. Pt's wife helps pt with ADLs. He is a taxidermist. He used to be active prior to onset of PD symptoms and would run a mile/day. Pt reports PT years ago for back pain. PMH significant for HTN, trigeminal neuralgia, barrett's esophagus with esophagitis, hypothyroidism, DDD, dysphagia, dyspnea on exertion, chronic cluster headache not intractable, peripheral edema, primary Parkinson's disease, B12 deficiency  PAIN:  Are you having pain? Yes: NPRS scale: not rated/10 Pain location: hips, legs  Pain description:   Aggravating factors: sitting too long Relieving factors: standing up briefly  PRECAUTIONS: Fall  RED FLAGS: Bowel or bladder incontinence: Yes: bladder   Hx of CA: negative Hx of compression fx: negative AAA: negative   WEIGHT BEARING RESTRICTIONS:  No  FALLS: Has patient fallen in last 6 months? No, not in the last six months but 2 falls a year ago states "when my leg gets to jumping."   LIVING ENVIRONMENT: Lives with: lives with their spouse and son Lives in: Other double wide Stairs: Yes: External: 5 steps; bilateral hand-rails Has following equipment at home: Single point cane and Walker - 2 wheeled  PLOF: Independent  PATIENT GOALS: Improve strength and decrease pain  OBJECTIVE:   DIAGNOSTIC FINDINGS:    Via chart  03/05/2022 NM BRAIN "FINDINGS: There is significant loss of activity within the entire RIGHT striatum. Relative maintained activity in the head of the LEFT caudate nucleus. Loss of activity in the LEFT putamen.   IMPRESSION: Mark asymmetric decreased radiotracer activity in the RIGHT striatum is a finding typical of Parkinsonian syndrome pathology.   Of note, DaTSCAN is not diagnostic of Parkinsonian syndromes, which remains a clinical diagnosis. DaTscan is an adjuvant test to aid in the clinical diagnosis of Parkinsonian syndromes.     Electronically Signed   By: Genevive Bi M.D.   On: 03/05/2022 14:41"  COGNITION: Overall cognitive status: Within functional limits for tasks assessed   SENSATION: WFL to light touch with testing UE/LE  Pt reports some tingling felt in his low back  COORDINATION: Tremor in bilat UE Some difficulty with rapid alt UE movement WFL chin<>target (pen) WFL rapid alt movement LE  EDEMA:  Pt reports no swelling  MUSCLE TONE: tremor bilat UE, observed rigidity with movement   POSTURE: rounded shoulders, forward head, and increased thoracic kyphosis   LOWER EXTREMITY MMT:    Grossly 4/5 bilat LE, greatest deficits in hip mm, and hip flexors are pain-limited.    BED MOBILITY:  Imparied per pt report. Has difficulty rolling and getting out of his bed   TRANSFERS: Assistive device utilized: Environmental consultant - 2 wheeled  Sit to stand: Modified  independence Stand to sit: Modified independence Chair to chair: SBA    STAIRS: Reports he avoids stairs due to difficulty   GAIT: Gait pattern:  festinating gait, crouched posture Distance walked: , clinic distances Assistive device utilized: Walker - 2 wheeled Level of assistance:  CGA Comments: with large-amplitude cuing for step-length pt able to improve during visit, significant difficulty with hypokinetic movement with turning, freezing   FUNCTIONAL TESTS:  5 times sit to stand: 31 sec with use of UE  : 0.67 m/s with RW  BERG deferred TUG deferred  PATIENT SURVEYS:  FOTO 12 (goal 36)  TODAY'S TREATMENT:                                                                                                                              DATE: 12/26/22  TA: Instructed in large-amplitude movement technique with gait. Pt exhibits improvement within session with step-length following cues.    PATIENT EDUCATION: Education details: recommendation for speech therapy (pt agreeable), exam findings, goals, plan Person educated: Patient Education method: Explanation, Demonstration, and Verbal cues Education comprehension: verbalized understanding, returned demonstration, verbal cues required, and needs further education  HOME EXERCISE PROGRAM: To be initiated    SHORT TERM GOALS: Target date: 02/06/2023   Patient will be independent in home exercise program to improve strength/mobility for better functional independence with ADLs. Baseline: to be initiated Goal status: INITIAL   LONG TERM GOALS: Target date: 03/20/2023    Patient will increase FOTO score to equal to or greater than  36   to demonstrate statistically significant improvement in mobility and quality of life.  Baseline: 12 Goal status: INITIAL  2.  Patient (> 3 years old) will complete five times sit to stand test in < 15 seconds indicating an increased LE strength and improved balance. Baseline: 31  sec use of BUE to push off chair Goal status: INITIAL  3.  Patient will increase Berg Balance score by > 6 points to demonstrate decreased fall risk during functional activities Baseline:  Goal status: INITIAL  4.  Patient will increase 10 meter walk test to >1.31m/s as to improve gait speed for better community ambulation and to reduce fall risk. Baseline: 0.67 m/s with RW Goal status: INITIAL  5.  Patient will reduce timed up and go to <11 seconds to reduce fall risk and demonstrate improved transfer/gait ability. Baseline:  Goal status: INITIAL    ASSESSMENT:  CLINICAL IMPRESSION: Patient is a pleasant 72 y.o. male who was seen today for physical therapy evaluation and treatment for impairments due to PD. Exam reveals impairments of strength, gait, balance, coordination and pain. Pt also at increased risk for falls AEB performance on 5xSTS. He exhibits bradykinetic and hypokinetic movement throughout eval and poor seated tolerance due to pain. Pt did show ability to improve movement during visit with large-amplitude cues. BERG and TUG to be tested next visit and HEP to be initiated. The pt will benefit from further skilled PT to address impairments in order to increase functional mobility, ADL ability and decrease fall risk.    OBJECTIVE IMPAIRMENTS: Abnormal gait, decreased activity tolerance, decreased balance, decreased coordination, decreased endurance, decreased mobility, difficulty walking, decreased ROM, decreased strength, impaired flexibility, impaired tone, impaired  UE functional use, improper body mechanics, postural dysfunction, and pain.   ACTIVITY LIMITATIONS: carrying, lifting, bending, sitting, standing, squatting, stairs, transfers, bed mobility, continence, bathing, toileting, dressing, hygiene/grooming, and locomotion level  PARTICIPATION LIMITATIONS: meal prep, cleaning, laundry, driving, shopping, community activity, occupation, and yard work  PERSONAL FACTORS: Age  and 3+ comorbidities: PMH significant for HTN, trigeminal neuralgia, barrett's esophagus with esophagitis, hypothyroidism, DDD, dysphagia, dyspnea on exertion, chronic cluster headache not intractable, peripheral edema, primary Parkinson's disease, B12 deficiency  are also affecting patient's functional outcome.   REHAB POTENTIAL: Good  CLINICAL DECISION MAKING: Evolving/moderate complexity  EVALUATION COMPLEXITY: Moderate  PLAN:  PT FREQUENCY: 2x/week  PT DURATION: 12 weeks  PLANNED INTERVENTIONS: Therapeutic exercises, Therapeutic activity, Neuromuscular re-education, Balance training, Gait training, Patient/Family education, Self Care, Joint mobilization, Stair training, Vestibular training, Canalith repositioning, Visual/preceptual remediation/compensation, Orthotic/Fit training, DME instructions, Electrical stimulation, Spinal mobilization, Cryotherapy, Moist heat, Splintting, Taping, Manual therapy, and Re-evaluation  PLAN FOR NEXT SESSION: BERG, initiate HEP, PD specific interventions   Baird Kay, PT 12/26/2022, 5:01 PM

## 2022-12-27 ENCOUNTER — Ambulatory Visit (INDEPENDENT_AMBULATORY_CARE_PROVIDER_SITE_OTHER): Payer: Medicare Other

## 2022-12-27 DIAGNOSIS — E538 Deficiency of other specified B group vitamins: Secondary | ICD-10-CM | POA: Diagnosis not present

## 2022-12-31 ENCOUNTER — Ambulatory Visit: Payer: Medicare Other | Attending: Neurology | Admitting: Physical Therapy

## 2022-12-31 DIAGNOSIS — R262 Difficulty in walking, not elsewhere classified: Secondary | ICD-10-CM | POA: Diagnosis not present

## 2022-12-31 DIAGNOSIS — M6281 Muscle weakness (generalized): Secondary | ICD-10-CM | POA: Diagnosis not present

## 2022-12-31 DIAGNOSIS — R278 Other lack of coordination: Secondary | ICD-10-CM | POA: Insufficient documentation

## 2022-12-31 DIAGNOSIS — R2681 Unsteadiness on feet: Secondary | ICD-10-CM | POA: Insufficient documentation

## 2022-12-31 DIAGNOSIS — R471 Dysarthria and anarthria: Secondary | ICD-10-CM | POA: Insufficient documentation

## 2022-12-31 NOTE — Therapy (Signed)
OUTPATIENT PHYSICAL THERAPY NEURO EVALUATION   Patient Name: Arthur Richardson MRN: [id number] DOB:08-12-1950, 72 y.o., male Today's Date: 12/31/2022   PCP: Dorcas Carrow, DO  REFERRING PROVIDER: Lonell Face, MD   END OF SESSION:  PT End of Session - 12/31/22 1409     Visit Number 2    Number of Visits 25    Date for PT Re-Evaluation 03/20/23    PT Start Time 1410    PT Stop Time 1445    PT Time Calculation (min) 35 min    Equipment Utilized During Treatment Gait belt    Activity Tolerance Patient tolerated treatment well    Behavior During Therapy WFL for tasks assessed/performed             Past Medical History:  Diagnosis Date   Hyperlipidemia    Hypertension    Past Surgical History:  Procedure Laterality Date   LITHOTRIPSY  09/1998 & 07/29/1999   Due to nephrolithiasis   PROSTATE SURGERY     Biopsy= Negative   TONSILLECTOMY     Patient Active Problem List   Diagnosis Date Noted   B12 deficiency 12/19/2022   Primary Parkinson's disease 07/01/2022   Senile purpura (HCC) 07/01/2022   Chronic cluster headache, not intractable 07/30/2021   Peripheral edema 07/30/2021   Dyspnea on exertion 06/08/2020   Trigger point of left shoulder region 08/21/2017   BPH with obstruction/lower urinary tract symptoms 02/20/2017   Advanced care planning/counseling discussion 02/20/2017   DDD (degenerative disc disease), lumbar 08/21/2016   Nephrolithiasis 02/01/2016   Barrett's esophagus with esophagitis 10/11/2014   Trigeminal neuralgia 10/11/2014   Hypertension 10/11/2014   Hyperlipidemia 10/11/2014   Hypothyroidism 10/11/2014   Dysphagia 08/04/2014    ONSET DATE: November 2023  REFERRING DIAG: 20.A1 (ICD-10-CM) - Parkinson's disease   THERAPY DIAG:  Difficulty in walking, not elsewhere classified  Muscle weakness (generalized)  Unsteadiness on feet  Other lack of coordination  Rationale for Evaluation and Treatment: Rehabilitation  SUBJECTIVE:                                                                                                                                                                                              SUBJECTIVE STATEMENT:   Pt states that he is tired from walking into hospital. Required transport chair from entrance to PT department.    Pt accompanied by: self  PERTINENT HISTORY:   The pt is a pleasant 72 yo male referred to PT for impairments due to Parkinson's. He ambulates to session using RW.  Pt's primary concerns include decreased strength and poor standing endurance. Pt  also reports if he sits for too long he can have pain in BLE and hips and his legs can "lock up." Pt also with decreased endurance with ambulation, poor balance, and decreased UE/grip strength. Pt using RW today but uses a cane at home for shorter distances. Pt has difficulty with ADLs due to PD symptoms and his wife typically helps him. He has trouble with bed mobility, specifically rolling over, and difficulty with transfers. Pt avoids stairs. Pt reports freezing episodes. He says he was told to stop PD medications last neurology visit due to severe vertigo. He has follow-up with Dr. Sherryll Burger in December to try new PD medication. Pt's wife helps pt with ADLs. He is a taxidermist. He used to be active prior to onset of PD symptoms and would run a mile/day. Pt reports PT years ago for back pain. PMH significant for HTN, trigeminal neuralgia, barrett's esophagus with esophagitis, hypothyroidism, DDD, dysphagia, dyspnea on exertion, chronic cluster headache not intractable, peripheral edema, primary Parkinson's disease, B12 deficiency  PAIN:  Are you having pain? Yes: NPRS scale: not rated/10 Pain location: hips, legs  Pain description:   Aggravating factors: sitting too long Relieving factors: standing up briefly  PRECAUTIONS: Fall  RED FLAGS: Bowel or bladder incontinence: Yes: bladder   Hx of CA: negative Hx of compression fx:  negative AAA: negative   WEIGHT BEARING RESTRICTIONS: No  FALLS: Has patient fallen in last 6 months? No, not in the last six months but 2 falls a year ago states "when my leg gets to jumping."   LIVING ENVIRONMENT: Lives with: lives with their spouse and son Lives in: Other double wide Stairs: Yes: External: 5 steps; bilateral hand-rails Has following equipment at home: Single point cane and Walker - 2 wheeled  PLOF: Independent  PATIENT GOALS: Improve strength and decrease pain  OBJECTIVE:   DIAGNOSTIC FINDINGS:    Via chart  03/05/2022 NM BRAIN "FINDINGS: There is significant loss of activity within the entire RIGHT striatum. Relative maintained activity in the head of the LEFT caudate nucleus. Loss of activity in the LEFT putamen.   IMPRESSION: Mark asymmetric decreased radiotracer activity in the RIGHT striatum is a finding typical of Parkinsonian syndrome pathology.   Of note, DaTSCAN is not diagnostic of Parkinsonian syndromes, which remains a clinical diagnosis. DaTscan is an adjuvant test to aid in the clinical diagnosis of Parkinsonian syndromes.     Electronically Signed   By: Genevive Bi M.D.   On: 03/05/2022 14:41"  COGNITION: Overall cognitive status: Within functional limits for tasks assessed   SENSATION: WFL to light touch with testing UE/LE  Pt reports some tingling felt in his low back  COORDINATION: Tremor in bilat UE Some difficulty with rapid alt UE movement WFL chin<>target (pen) WFL rapid alt movement LE  EDEMA:  Pt reports no swelling  MUSCLE TONE: tremor bilat UE, observed rigidity with movement   POSTURE: rounded shoulders, forward head, and increased thoracic kyphosis   LOWER EXTREMITY MMT:    Grossly 4/5 bilat LE, greatest deficits in hip mm, and hip flexors are pain-limited.    BED MOBILITY:  Imparied per pt report. Has difficulty rolling and getting out of his bed   TRANSFERS: Assistive device utilized:  Environmental consultant - 2 wheeled  Sit to stand: Modified independence Stand to sit: Modified independence Chair to chair: SBA    STAIRS: Reports he avoids stairs due to difficulty   GAIT: Gait pattern:  festinating gait, crouched posture Distance walked: , clinic distances  Assistive device utilized: Environmental consultant - 2 wheeled Level of assistance: CGA Comments: with large-amplitude cuing for step-length pt able to improve during visit, significant difficulty with hypokinetic movement with turning, freezing   FUNCTIONAL TESTS:  5 times sit to stand: 31 sec with use of UE  : 0.67 m/s with RW  BERG 9/3: 26  TUG 9/3: 35.45sec  PATIENT SURVEYS:  FOTO 12 (goal 36)  TODAY'S TREATMENT:                                                                                                                              DATE: 12/31/22  TA: Instructed in large-amplitude movement technique with gait. Pt exhibits improvement within session with step-length following cues.  Patient demonstrates increased fall risk as noted by score of   26/56 on Berg Balance Scale.  (<36= high risk for falls, close to 100%; 37-45 significant >80%; 46-51 moderate >50%; 52-55 lower >25%)   [facility] PT Assessment - 12/31/22 0001       Berg Balance Test   Sit to Stand Able to stand  independently using hands    Standing Unsupported Able to stand 2 minutes with supervision    Sitting with Back Unsupported but Feet Supported on Floor or Stool Able to sit safely and securely 2 minutes    Stand to Sit Uses backs of legs against chair to control descent    Transfers Needs one person to assist    Standing Unsupported with Eyes Closed Able to stand 10 seconds with supervision    Standing Unsupported with Feet Together Needs help to attain position but able to stand for 30 seconds with feet together    From Standing, Reach Forward with Outstretched Arm Can reach forward >5 cm safely (2")    From Standing Position, Pick up Object from Floor  Able to pick up shoe, needs supervision    From Standing Position, Turn to Look Behind Over each Shoulder Needs supervision when turning    Turn 360 Degrees Needs assistance while turning    Standing Unsupported, Alternately Place Feet on Step/Stool Able to complete >2 steps/needs minimal assist    Standing Unsupported, One Foot in Front Needs help to step but can hold 15 seconds    Standing on One Leg Tries to lift leg/unable to hold 3 seconds but remains standing independently    Total Score 26            PT instructed pt in TUG: 35.45 sec (average of 3 trials; >13.5 sec indicates increased fall risk)  Standing step over cane in floor with BUE support x 10 bil and without UE support x 6 bil. Min-mod assist for weight shift to allow limb advancement without use of AD.     PATIENT EDUCATION: Education details: recommendation for speech therapy (pt agreeable), exam findings, goals, plan Person educated: Patient Education method: Explanation, Demonstration, and Verbal cues Education comprehension: verbalized understanding, returned demonstration, verbal cues required, and  needs further education  HOME EXERCISE PROGRAM: To be initiated    SHORT TERM GOALS: Target date: 02/06/2023   Patient will be independent in home exercise program to improve strength/mobility for better functional independence with ADLs. Baseline: to be initiated Goal status: INITIAL   LONG TERM GOALS: Target date: 03/20/2023    Patient will increase FOTO score to equal to or greater than  36   to demonstrate statistically significant improvement in mobility and quality of life.  Baseline: 12 Goal status: INITIAL  2.  Patient (> 57 years old) will complete five times sit to stand test in < 15 seconds indicating an increased LE strength and improved balance. Baseline: 31 sec use of BUE to push off chair Goal status: INITIAL  3.  Patient will increase Berg Balance score by > 6 points to demonstrate  decreased fall risk during functional activities Baseline: 26 Goal status: INITIAL  4.  Patient will increase 10 meter walk test to >1.64m/s as to improve gait speed for better community ambulation and to reduce fall risk. Baseline: 0.67 m/s with RW Goal status: INITIAL  5.  Patient will reduce timed up and go to <11 seconds to reduce fall risk and demonstrate improved transfer/gait ability. Baseline: 35.45sec  Goal status: INITIAL    ASSESSMENT:  CLINICAL IMPRESSION: Patient is a pleasant 72 y.o. male who was seen today for physical therapy treatment for impairments due to PD. Pt continues to demonstrate increased fall risk with Berg score of 26 and TUG of 35.45 sec. Severe festination of gait in turns at each end of the 74ft. Cues for step height/length over cane in floor with improved weight shift and step length with cues for sequencing.  The pt will benefit from further skilled PT to address impairments in order to increase functional mobility, ADL ability and decrease fall risk.    OBJECTIVE IMPAIRMENTS: Abnormal gait, decreased activity tolerance, decreased balance, decreased coordination, decreased endurance, decreased mobility, difficulty walking, decreased ROM, decreased strength, impaired flexibility, impaired tone, impaired UE functional use, improper body mechanics, postural dysfunction, and pain.   ACTIVITY LIMITATIONS: carrying, lifting, bending, sitting, standing, squatting, stairs, transfers, bed mobility, continence, bathing, toileting, dressing, hygiene/grooming, and locomotion level  PARTICIPATION LIMITATIONS: meal prep, cleaning, laundry, driving, shopping, community activity, occupation, and yard work  PERSONAL FACTORS: Age and 3+ comorbidities: PMH significant for HTN, trigeminal neuralgia, barrett's esophagus with esophagitis, hypothyroidism, DDD, dysphagia, dyspnea on exertion, chronic cluster headache not intractable, peripheral edema, primary Parkinson's disease,  B12 deficiency  are also affecting patient's functional outcome.   REHAB POTENTIAL: Good  CLINICAL DECISION MAKING: Evolving/moderate complexity  EVALUATION COMPLEXITY: Moderate  PLAN:  PT FREQUENCY: 2x/week  PT DURATION: 12 weeks  PLANNED INTERVENTIONS: Therapeutic exercises, Therapeutic activity, Neuromuscular re-education, Balance training, Gait training, Patient/Family education, Self Care, Joint mobilization, Stair training, Vestibular training, Canalith repositioning, Visual/preceptual remediation/compensation, Orthotic/Fit training, DME instructions, Electrical stimulation, Spinal mobilization, Cryotherapy, Moist heat, Splintting, Taping, Manual therapy, and Re-evaluation  PLAN FOR NEXT SESSION:   PD HEP and large amplitude movements to improve step length in turns.   Golden Pop, PT 12/31/2022, 2:10 PM

## 2023-01-01 ENCOUNTER — Ambulatory Visit: Payer: Medicare Other | Admitting: Physical Therapy

## 2023-01-02 ENCOUNTER — Ambulatory Visit: Payer: Medicare Other

## 2023-01-02 DIAGNOSIS — R278 Other lack of coordination: Secondary | ICD-10-CM | POA: Diagnosis not present

## 2023-01-02 DIAGNOSIS — R262 Difficulty in walking, not elsewhere classified: Secondary | ICD-10-CM | POA: Diagnosis not present

## 2023-01-02 DIAGNOSIS — R471 Dysarthria and anarthria: Secondary | ICD-10-CM

## 2023-01-02 DIAGNOSIS — R2681 Unsteadiness on feet: Secondary | ICD-10-CM | POA: Diagnosis not present

## 2023-01-02 DIAGNOSIS — M6281 Muscle weakness (generalized): Secondary | ICD-10-CM | POA: Diagnosis not present

## 2023-01-02 NOTE — Therapy (Addendum)
OUTPATIENT SPEECH LANGUAGE PATHOLOGY PARKINSON'S EVALUATION   Patient Name: Arthur Richardson MRN: [id number] DOB:[birth year], 72 y.o., male Today's Date: 01/02/2023  PCP: Olevia Perches, DO  REFERRING PROVIDER: Cristopher Peru, MD   End of Session - 01/02/23 1220     Visit Number 1    Number of Visits 25    Date for SLP Re-Evaluation 03/27/23    Authorization Type [employer]    Progress Note Due on Visit 10    SLP Start Time 1010    SLP Stop Time  1100    SLP Time Calculation (min) 50 min             Past Medical History:  Diagnosis Date   Hyperlipidemia    Hypertension    Past Surgical History:  Procedure Laterality Date   LITHOTRIPSY  09/1998 & 07/29/1999   Due to nephrolithiasis   PROSTATE SURGERY     Biopsy= Negative   TONSILLECTOMY     Patient Active Problem List   Diagnosis Date Noted   B12 deficiency 12/19/2022   Primary Parkinson's disease 07/01/2022   Senile purpura (HCC) 07/01/2022   Chronic cluster headache, not intractable 07/30/2021   Peripheral edema 07/30/2021   Dyspnea on exertion 06/08/2020   Trigger point of left shoulder region 08/21/2017   BPH with obstruction/lower urinary tract symptoms 02/20/2017   Advanced care planning/counseling discussion 02/20/2017   DDD (degenerative disc disease), lumbar 08/21/2016   Nephrolithiasis 02/01/2016   Barrett's esophagus with esophagitis 10/11/2014   Trigeminal neuralgia 10/11/2014   Hypertension 10/11/2014   Hyperlipidemia 10/11/2014   Hypothyroidism 10/11/2014   Dysphagia 08/04/2014    ONSET DATE: 12/26/22 referral date; pt reports being diagnosed with Parkinson's Disease in November 2023  REFERRING DIAG: Parkinson's Disease  THERAPY DIAG:  Dysarthria and anarthria  Rationale for Evaluation and Treatment Rehabilitation  SUBJECTIVE:   SUBJECTIVE STATEMENT: "I can't half talk half the time." Pt alert, pleasant, and cooperative. Pt using RW. Shuffling gait appreciated. Pt accompanied by:  significant other; wife, [name], waited in waiting room    PERTINENT HISTORY: Pt 72 yo male referred to SLP for impairments due to Parkinson's Disease. PMHx significant for HTN, trigeminal neuralgia, barrett's esophagus with esophagitis, hypothyroidism, DDD, dysphagia (reports esophageal dilation in past), dyspnea on exertion, chronic cluster headache not intractable, peripheral edema, primary Parkinson's disease, B12 deficiency  DIAGNOSTIC FINDINGS: DaTSCAN, 02/2022, "Mark asymmetric decreased radiotracer activity in the RIGHT striatum is a finding typical of Parkinsonian syndrome pathology."  PAIN:  Are you having pain? No  FALLS: Has patient fallen in last 6 months?  No  LIVING ENVIRONMENT: Lives with: lives with their family; spouse and adult son  Lives in: Mobile home  PLOF:  Level of assistance: Comment: modified independent Employment: Retired, Other: works as a Geneticist, molecular   PATIENT GOALS    "for people to hear me"  OBJECTIVE:  COGNITIVE COMMUNICATION Overall cognitive status: Within functional limits for tasks assessed    MOTOR SPEECH: Overall motor speech: impaired Level of impairment:  all levels Respiration: clavicular breathing and speaking on residual capacity Phonation: aphonic, hoarse, low vocal intensity, and pitch/volume breaks; fluctuating Resonance: WFL Articulation: Appears intact Intelligibility: Intelligibility reduced Motor planning: Appears intact Effective technique: increased vocal intensity  ORAL MOTOR EXAMINATION Facial : Comment: impaired ROM bilaterally; ?slight R facial droop  Lingual: Strength Impaired: Impaired left, Impaired right; tremors with volitional movement Velum: WFL Mandible: WFL Cough: Weak Voice: Hoarse, Weak   OBJECTIVE VOICE ASSESSMENT: Sustained "ah" maximum phonation time: 4.6 seconds Sustained "  ah" loudness average: 76 dB Average fundamental frequency during sustained "ah":136 Hz   (WNL of  145 Hz +/- 23 for gender)   Oral reading (passage) loudness average: 64 dB Oral reading loudness range: 59-78 dB Conversational pitch average: 129 Hz Conversational pitch range: 120-153 Hz Conversational loudness average: 66 dB Conversational loudness range: 57-83 dB S/z ratio: .98 (Suggestive of dysfunction >1.0) Voice quality: hoarse, low vocal intensity, vocal fatigue, aphonic, and whispering at times; pitch and volume breaks Stimulability trials: Given SLP modeling and consistent max cues, loudness average increased to 85 dB with loud "ah" Speech is largely intelligible; however, reduced intelligibility occurs due to acoustic measures of reduced vocal intensity and pitch variability as well as perceptual features of short rushes of speech.  Patient does not report difficulty swallowing which does not warrant further evaluation. Pt hx of esophageal dilation per pt report, but has no current difficulty.  PATIENT REPORTED OUTCOME MEASURES (PROM): To be given next session     PATIENT EDUCATION: Education details: results of assessment, changes to speech with Parkinson's Disease, POC, role of SLP Person educated: Patient Education method: Explanation Education comprehension: verbalized understanding and needs further education   HOME EXERCISE PROGRAM: To be given in upcoming sessions   GOALS: Goals reviewed with patient? Yes  SHORT TERM GOALS: Target date: 10 sessions  Patient will demo HEP for dysarthria with min cues.  Baseline: Goal status: INITIAL  2.  Patient will maintain adequate vocal quality and intensity (>70dB) in sentence level responses 90% accuracy.  Baseline:  Goal status: INITIAL  3.  Patient will use dysarthria compensations in 2-3 sentence responses 90% accuracy with moderate cues.  Baseline:  Goal status: INITIAL  4.  Patient will complete PROM. Baseline:  Goal status: INITIAL   LONG TERM GOALS: Target date: 12 weeks  Patient will demo HEP for dysarthria independently.   Baseline:  Goal status: INITIAL  2.  Patient will maintain intensity (avg >70dB) in 10 minutes conversation with modified independence  Baseline:  Goal status: INITIAL  3.  Patient will use dysarthria compensations (slow, loud, overpronounce, pause) for intelligibility >90% in 10 minutes conversation.  Baseline:  Goal status: INITIAL  4.  Patient will report improvement in PROM.  Baseline:  Goal status: INITIAL    ASSESSMENT:  CLINICAL IMPRESSION: Patient is a 72  y.o. male who was seen today for communication evaluation in setting of Parkinson's Disease. Patient presents with at least a moderate hypokinetic dysarthria characterized by reduced vocal intensity, reduced breath support, reduced pitch variability, intermittent rapid rate, and waxing/waning vocal quality (hypophonic, hoarse, aphonic, near whisper). Pt with difficulty maintaining adequate vocal intensity and quality and well as short rushes of speech which impact intelligibility. Pt is a good candidate for intensive ST services; however, due to limited availability, we will utilize intensity-based approach 2x per week for 12 weeks. I recommend skilled ST to improve vocal quality, endurance, and intensity for improved intelligibility, and to meet other vocal demands of work, home, and socialization.     OBJECTIVE IMPAIRMENTS include dysarthria. These impairments are limiting patient from effectively communicating at home and in community. Factors affecting potential to achieve goals and functional outcome are medical prognosis. Patient will benefit from skilled SLP services to address above impairments and improve overall function.  REHAB POTENTIAL: Good  PLAN: SLP FREQUENCY: 2x/week  SLP DURATION: 12 weeks  PLANNED INTERVENTIONS: Cueing hierachy, Internal/external aids, Functional tasks, SLP instruction and feedback, Compensatory strategies, Patient/family education, and Re-evaluation   Clyde Canterbury,  M.S.,  CCC-SLP Speech-Language Pathologist [facility] [phone] Arnette Felts)   [place] [facility] [address] [place], [place], [address] Phone: [phone]   Fax:  [phone]

## 2023-01-03 ENCOUNTER — Ambulatory Visit (INDEPENDENT_AMBULATORY_CARE_PROVIDER_SITE_OTHER): Payer: Medicare Other

## 2023-01-03 DIAGNOSIS — E538 Deficiency of other specified B group vitamins: Secondary | ICD-10-CM | POA: Diagnosis not present

## 2023-01-03 MED ORDER — CYANOCOBALAMIN 1000 MCG/ML IJ SOLN
1000.0000 ug | Freq: Once | INTRAMUSCULAR | Status: AC
Start: 2023-01-03 — End: 2023-01-03
  Administered 2023-01-03: 1000 ug via INTRAMUSCULAR

## 2023-01-07 ENCOUNTER — Ambulatory Visit: Payer: Medicare Other | Admitting: Physical Therapy

## 2023-01-07 DIAGNOSIS — R262 Difficulty in walking, not elsewhere classified: Secondary | ICD-10-CM

## 2023-01-07 DIAGNOSIS — R2681 Unsteadiness on feet: Secondary | ICD-10-CM | POA: Diagnosis not present

## 2023-01-07 DIAGNOSIS — R471 Dysarthria and anarthria: Secondary | ICD-10-CM | POA: Diagnosis not present

## 2023-01-07 DIAGNOSIS — M6281 Muscle weakness (generalized): Secondary | ICD-10-CM

## 2023-01-07 DIAGNOSIS — R278 Other lack of coordination: Secondary | ICD-10-CM | POA: Diagnosis not present

## 2023-01-07 NOTE — Therapy (Signed)
OUTPATIENT PHYSICAL THERAPY NEURO EVALUATION   Patient Name: Arthur Richardson MRN: [id number] DOB:[birth year], 72 y.o., male Today's Date: 01/07/2023   PCP: Dorcas Carrow, DO  REFERRING PROVIDER: Lonell Face, MD   END OF SESSION:  PT End of Session - 01/07/23 1053     Visit Number 3    Number of Visits 25    Date for PT Re-Evaluation 03/20/23    PT Start Time 1105    PT Stop Time 1145    PT Time Calculation (min) 40 min    Equipment Utilized During Treatment Gait belt    Activity Tolerance Patient tolerated treatment well    Behavior During Therapy WFL for tasks assessed/performed             Past Medical History:  Diagnosis Date   Hyperlipidemia    Hypertension    Past Surgical History:  Procedure Laterality Date   LITHOTRIPSY  09/1998 & 07/29/1999   Due to nephrolithiasis   PROSTATE SURGERY     Biopsy= Negative   TONSILLECTOMY     Patient Active Problem List   Diagnosis Date Noted   B12 deficiency 12/19/2022   Primary Parkinson's disease 07/01/2022   Senile purpura (HCC) 07/01/2022   Chronic cluster headache, not intractable 07/30/2021   Peripheral edema 07/30/2021   Dyspnea on exertion 06/08/2020   Trigger point of left shoulder region 08/21/2017   BPH with obstruction/lower urinary tract symptoms 02/20/2017   Advanced care planning/counseling discussion 02/20/2017   DDD (degenerative disc disease), lumbar 08/21/2016   Nephrolithiasis 02/01/2016   Barrett's esophagus with esophagitis 10/11/2014   Trigeminal neuralgia 10/11/2014   Hypertension 10/11/2014   Hyperlipidemia 10/11/2014   Hypothyroidism 10/11/2014   Dysphagia 08/04/2014    ONSET DATE: November 2023  REFERRING DIAG: 20.A1 (ICD-10-CM) - Parkinson's disease   THERAPY DIAG:  Difficulty in walking, not elsewhere classified  Muscle weakness (generalized)  Unsteadiness on feet  Other lack of coordination  Rationale for Evaluation and Treatment: Rehabilitation  SUBJECTIVE:                                                                                                                                                                                              SUBJECTIVE STATEMENT:   Pt states that he is sore in BLE and hips on this day. Reports trying to mow yard on zero turn lawn mower, then feeling stuck and sore for the rest of the day. Soreness has continued since last Friday in bil hips and BLE  Pt accompanied by: self  PERTINENT HISTORY:   The pt is a pleasant 72 yo male referred  to PT for impairments due to Parkinson's. He ambulates to session using RW.  Pt's primary concerns include decreased strength and poor standing endurance. Pt also reports if he sits for too long he can have pain in BLE and hips and his legs can "lock up." Pt also with decreased endurance with ambulation, poor balance, and decreased UE/grip strength. Pt using RW today but uses a cane at home for shorter distances. Pt has difficulty with ADLs due to PD symptoms and his wife typically helps him. He has trouble with bed mobility, specifically rolling over, and difficulty with transfers. Pt avoids stairs. Pt reports freezing episodes. He says he was told to stop PD medications last neurology visit due to severe vertigo. He has follow-up with Dr. Sherryll Burger in December to try new PD medication. Pt's wife helps pt with ADLs. He is a taxidermist. He used to be active prior to onset of PD symptoms and would run a mile/day. Pt reports PT years ago for back pain. PMH significant for HTN, trigeminal neuralgia, barrett's esophagus with esophagitis, hypothyroidism, DDD, dysphagia, dyspnea on exertion, chronic cluster headache not intractable, peripheral edema, primary Parkinson's disease, B12 deficiency  PAIN:  Are you having pain? Yes: NPRS scale: not rated/10 Pain location: hips, legs  Pain description:   Aggravating factors: sitting too long Relieving factors: standing up briefly  PRECAUTIONS:  Fall  RED FLAGS: Bowel or bladder incontinence: Yes: bladder   Hx of CA: negative Hx of compression fx: negative AAA: negative   WEIGHT BEARING RESTRICTIONS: No  FALLS: Has patient fallen in last 6 months? No, not in the last six months but 2 falls a year ago states "when my leg gets to jumping."   LIVING ENVIRONMENT: Lives with: lives with their spouse and son Lives in: Other double wide Stairs: Yes: External: 5 steps; bilateral hand-rails Has following equipment at home: Single point cane and Walker - 2 wheeled  PLOF: Independent  PATIENT GOALS: Improve strength and decrease pain  OBJECTIVE:   DIAGNOSTIC FINDINGS:    Via chart  03/05/2022 NM BRAIN "FINDINGS: There is significant loss of activity within the entire RIGHT striatum. Relative maintained activity in the head of the LEFT caudate nucleus. Loss of activity in the LEFT putamen.   IMPRESSION: Mark asymmetric decreased radiotracer activity in the RIGHT striatum is a finding typical of Parkinsonian syndrome pathology.   Of note, DaTSCAN is not diagnostic of Parkinsonian syndromes, which remains a clinical diagnosis. DaTscan is an adjuvant test to aid in the clinical diagnosis of Parkinsonian syndromes.     Electronically Signed   By: Genevive Bi M.D.   On: 03/05/2022 14:41"  COGNITION: Overall cognitive status: Within functional limits for tasks assessed   SENSATION: WFL to light touch with testing UE/LE  Pt reports some tingling felt in his low back  COORDINATION: Tremor in bilat UE Some difficulty with rapid alt UE movement WFL chin<>target (pen) WFL rapid alt movement LE  EDEMA:  Pt reports no swelling  MUSCLE TONE: tremor bilat UE, observed rigidity with movement   POSTURE: rounded shoulders, forward head, and increased thoracic kyphosis   LOWER EXTREMITY MMT:    Grossly 4/5 bilat LE, greatest deficits in hip mm, and hip flexors are pain-limited.    BED MOBILITY:  Imparied per  pt report. Has difficulty rolling and getting out of his bed   TRANSFERS: Assistive device utilized: Environmental consultant - 2 wheeled  Sit to stand: Modified independence Stand to sit: Modified independence Chair to chair: SBA  STAIRS: Reports he avoids stairs due to difficulty   GAIT: Gait pattern:  festinating gait, crouched posture Distance walked: , clinic distances Assistive device utilized: Walker - 2 wheeled Level of assistance: CGA Comments: with large-amplitude cuing for step-length pt able to improve during visit, significant difficulty with hypokinetic movement with turning, freezing   FUNCTIONAL TESTS:  5 times sit to stand: 31 sec with use of UE  : 0.67 m/s with RW  BERG 9/3: 26  TUG 9/3: 35.45sec  PATIENT SURVEYS:  FOTO 12 (goal 36)  TODAY'S TREATMENT:                                                                                                                              DATE: 01/07/23   Gait with RW x 110ft +59ft with supervision assist and multiple cues for targeted increase in step length through transitions to prevent freezing and festination. Pt noted to carry RW off ground unless experiencing freezing or festination.   PT instructed pt in HEP:  Sit<>stand with UE swing into extension with 2 sec hold x 10  Sit<>stand with single UE raise upon head x 10 bil  Hip abduction with UE supported on chair back x 10 bil.  Reciprocal march x 10 bil with UE supported on RW and visual target to hit knees on cross bar as able.   Pt reports pain in the LLE with abduction, cane placed in floor to provide target for lateral step with the LLE. Noted reduced pain with lateral step vs pure abduction.   Seated  PWR! Up x 10 with cues for UE position to reduce shoulder pain with 5 sec hold in scapular retraction  PWR! Twist x 10 bil with visual ad verbal cues for full scapular retraction and sequencing of movements.   Stepping over cane on floor with BUE support x 10   Stepping over cane on floor without UE support x 10   CGA-min assist throughout session with increased assist from PT with stepping tasks without AD to prevent lateral LOB     PATIENT EDUCATION: Education details: recommendation for speech therapy (pt agreeable), exam findings, goals, plan Person educated: Patient Education method: Explanation, Demonstration, and Verbal cues Education comprehension: verbalized understanding, returned demonstration, verbal cues required, and needs further education  HOME EXERCISE PROGRAM: To be initiated    SHORT TERM GOALS: Target date: 02/06/2023   Patient will be independent in home exercise program to improve strength/mobility for better functional independence with ADLs. Baseline: to be initiated Goal status: INITIAL   LONG TERM GOALS: Target date: 03/20/2023    Patient will increase FOTO score to equal to or greater than  36   to demonstrate statistically significant improvement in mobility and quality of life.  Baseline: 12 Goal status: INITIAL  2.  Patient (> 2 years old) will complete five times sit to stand test in < 15 seconds indicating an increased LE strength and improved balance. Baseline:  31 sec use of BUE to push off chair Goal status: INITIAL  3.  Patient will increase Berg Balance score by > 6 points to demonstrate decreased fall risk during functional activities Baseline: 26 Goal status: INITIAL  4.  Patient will increase 10 meter walk test to >1.69m/s as to improve gait speed for better community ambulation and to reduce fall risk. Baseline: 0.67 m/s with RW Goal status: INITIAL  5.  Patient will reduce timed up and go to <11 seconds to reduce fall risk and demonstrate improved transfer/gait ability. Baseline: 35.45sec  Goal status: INITIAL    ASSESSMENT:  CLINICAL IMPRESSION: Patient present to PT with increased stiffness per self report. PT session focused on improved BUE/BLE large amplitude movements with  HEP provided to improve posture and address bradykinesia. Mild pain limited hip abduction but able to perform adaptive version of movements. The pt will benefit from further skilled PT to address impairments in order to increase functional mobility, ADL ability and decrease fall risk.    OBJECTIVE IMPAIRMENTS: Abnormal gait, decreased activity tolerance, decreased balance, decreased coordination, decreased endurance, decreased mobility, difficulty walking, decreased ROM, decreased strength, impaired flexibility, impaired tone, impaired UE functional use, improper body mechanics, postural dysfunction, and pain.   ACTIVITY LIMITATIONS: carrying, lifting, bending, sitting, standing, squatting, stairs, transfers, bed mobility, continence, bathing, toileting, dressing, hygiene/grooming, and locomotion level  PARTICIPATION LIMITATIONS: meal prep, cleaning, laundry, driving, shopping, community activity, occupation, and yard work  PERSONAL FACTORS: Age and 3+ comorbidities: PMH significant for HTN, trigeminal neuralgia, barrett's esophagus with esophagitis, hypothyroidism, DDD, dysphagia, dyspnea on exertion, chronic cluster headache not intractable, peripheral edema, primary Parkinson's disease, B12 deficiency  are also affecting patient's functional outcome.   REHAB POTENTIAL: Good  CLINICAL DECISION MAKING: Evolving/moderate complexity  EVALUATION COMPLEXITY: Moderate  PLAN:  PT FREQUENCY: 2x/week  PT DURATION: 12 weeks  PLANNED INTERVENTIONS: Therapeutic exercises, Therapeutic activity, Neuromuscular re-education, Balance training, Gait training, Patient/Family education, Self Care, Joint mobilization, Stair training, Vestibular training, Canalith repositioning, Visual/preceptual remediation/compensation, Orthotic/Fit training, DME instructions, Electrical stimulation, Spinal mobilization, Cryotherapy, Moist heat, Splintting, Taping, Manual therapy, and Re-evaluation  PLAN FOR NEXT SESSION:    Continue to advance HEP. Rotation movements and improved step length.   Golden Pop, PT 01/07/2023, 11:05 AM

## 2023-01-09 ENCOUNTER — Ambulatory Visit: Payer: Medicare Other

## 2023-01-09 DIAGNOSIS — R278 Other lack of coordination: Secondary | ICD-10-CM | POA: Diagnosis not present

## 2023-01-09 DIAGNOSIS — R471 Dysarthria and anarthria: Secondary | ICD-10-CM

## 2023-01-09 DIAGNOSIS — R262 Difficulty in walking, not elsewhere classified: Secondary | ICD-10-CM | POA: Diagnosis not present

## 2023-01-09 DIAGNOSIS — R2681 Unsteadiness on feet: Secondary | ICD-10-CM | POA: Diagnosis not present

## 2023-01-09 DIAGNOSIS — M6281 Muscle weakness (generalized): Secondary | ICD-10-CM | POA: Diagnosis not present

## 2023-01-09 NOTE — Therapy (Signed)
OUTPATIENT SPEECH LANGUAGE PATHOLOGY PARKINSON'S TREATMENT   Patient Name: Arthur Richardson MRN: [id number] DOB:09/12/1950, 72 y.o., male Today's Date: 01/09/2023  PCP: Olevia Perches, DO  REFERRING PROVIDER: Cristopher Peru, MD   End of Session - 01/09/23 1049     Visit Number 2    Number of Visits 25    Date for SLP Re-Evaluation 03/27/23    Authorization Type [employer] Medicare    Progress Note Due on Visit 10    SLP Start Time 1050    SLP Stop Time  1145    SLP Time Calculation (min) 55 min    Activity Tolerance Patient tolerated treatment well             Past Medical History:  Diagnosis Date   Hyperlipidemia    Hypertension    Past Surgical History:  Procedure Laterality Date   LITHOTRIPSY  09/1998 & 07/29/1999   Due to nephrolithiasis   PROSTATE SURGERY     Biopsy= Negative   TONSILLECTOMY     Patient Active Problem List   Diagnosis Date Noted   B12 deficiency 12/19/2022   Primary Parkinson's disease 07/01/2022   Senile purpura (HCC) 07/01/2022   Chronic cluster headache, not intractable 07/30/2021   Peripheral edema 07/30/2021   Dyspnea on exertion 06/08/2020   Trigger point of left shoulder region 08/21/2017   BPH with obstruction/lower urinary tract symptoms 02/20/2017   Advanced care planning/counseling discussion 02/20/2017   DDD (degenerative disc disease), lumbar 08/21/2016   Nephrolithiasis 02/01/2016   Barrett's esophagus with esophagitis 10/11/2014   Trigeminal neuralgia 10/11/2014   Hypertension 10/11/2014   Hyperlipidemia 10/11/2014   Hypothyroidism 10/11/2014   Dysphagia 08/04/2014    ONSET DATE: 12/26/22 referral date; pt reports being diagnosed with Parkinson's Disease in November 2023  REFERRING DIAG: Parkinson's Disease  THERAPY DIAG:  Dysarthria and anarthria  Rationale for Evaluation and Treatment Rehabilitation  SUBJECTIVE:   SUBJECTIVE STATEMENT: Pt alert, pleasant, and cooperative. Pt using RW. Festinating gait  appreciated. Pt accompanied by: significant other; wife, [name], waited in waiting room    PERTINENT HISTORY: Pt 72 yo male referred to SLP for impairments due to Parkinson's Disease. PMHx significant for HTN, trigeminal neuralgia, barrett's esophagus with esophagitis, hypothyroidism, DDD, dysphagia (reports esophageal dilation in past), dyspnea on exertion, chronic cluster headache not intractable, peripheral edema, primary Parkinson's disease, B12 deficiency  DIAGNOSTIC FINDINGS: DaTSCAN, 02/2022, "Mark asymmetric decreased radiotracer activity in the RIGHT striatum is a finding typical of Parkinsonian syndrome pathology."  PAIN:  Are you having pain? No  FALLS: Has patient fallen in last 6 months?  No  LIVING ENVIRONMENT: Lives with: lives with their family; spouse and adult son  Lives in: Mobile home  PLOF:  Level of assistance: Comment: modified independent Employment: Retired, Other: works as a Geneticist, molecular   PATIENT GOALS    "for people to hear me"  OBJECTIVE TODAY'S TREATMENT:  Pt completed PROM as outlined below: PATIENT REPORTED OUTCOME MEASURES (PROM):  VOICE HANDICAP INDEX (VHI)  The Voice Handicap Index is comprised of a series of questions to assess the patient's perception of their voice. It is designed to evaluate the emotional, physical and functional components of the voice problem.  Functional: 28 Physical: 32 Emotional: 13 Total: 73/120 (severe)  Introduction of changes to voice in Parkinson's Disease and speaking loud and with intent. Introduced HEP. With use of Voice Analyst App to capture acoustic data and provide biofeedback, pt completed as follows: Warm Up: May-Me-My-Moe-Moo - x10 with min/mod verbal cues 75-85dB  Sustained "Ah": x10 with mod/max verbal cues; averaged 5s, 70-85dB, tendency to trail of and phonate on residual volume Ascending Pitch Glides: x10 with mod/max verbal cues; 130-170Hz , 75-85dB Descending Pitch Glides: x10 with mod/max verbal  cues: 140-115Hz , 75-85dB  Oral reading of functional phrases loudly and intentionally: Mod verbal cues 75-85 dB  Structured speech: loudly as if SLP was across the room ~65-70dB  Reduced carryover of compensations into informal exchanges noted. Additionally, pt benefited from rest breaks between ex's in HEP due to complaints of SOB.      PATIENT EDUCATION: Education details: HEP, POC, changes to voice in Parkinson's Disease Person educated: Patient and Spouse Education method: Explanation and Handouts Education comprehension: verbalized understanding and needs further education   HOME EXERCISE PROGRAM: Yes - ex's as above and functional phrases for repetition   GOALS: Goals reviewed with patient? Yes  SHORT TERM GOALS: Target date: 10 sessions  Patient will demo HEP for dysarthria with min cues.  Baseline: Goal status: INITIAL  2.  Patient will maintain adequate vocal quality and intensity (>70dB) in sentence level responses 90% accuracy.  Baseline:  Goal status: INITIAL  3.  Patient will use dysarthria compensations in 2-3 sentence responses 90% accuracy with moderate cues.  Baseline:  Goal status: INITIAL  4.  Patient will complete PROM. Baseline:  Goal status: MET   LONG TERM GOALS: Target date: 12 weeks  Patient will demo HEP for dysarthria independently.  Baseline:  Goal status: INITIAL  2.  Patient will maintain intensity (avg >70dB) in 10 minutes conversation with modified independence  Baseline:  Goal status: INITIAL  3.  Patient will use dysarthria compensations (slow, loud, overpronounce, pause) for intelligibility >90% in 10 minutes conversation.  Baseline:  Goal status: INITIAL  4.  Patient will report improvement in PROM.  Baseline: VHI 73/120 (severe) Goal status: INITIAL    ASSESSMENT:  CLINICAL IMPRESSION: Patient is a 72  y.o. male who was seen today for speech therapy session in setting of Parkinson's Disease. Patient presents with at  least a moderate hypokinetic dysarthria characterized by reduced vocal intensity, reduced breath support, reduced pitch variability, intermittent rapid rate, and waxing/waning vocal quality (hypophonic, hoarse, aphonic, near whisper). Pt with difficulty maintaining adequate vocal intensity and quality and well as short rushes of speech which impact intelligibility. See details of tx above. Pt is a good candidate for intensive ST services; however, due to limited availability, we will utilize intensity-based approach 2x per week for 12 weeks. I recommend skilled ST to improve vocal quality, endurance, and intensity for improved intelligibility, and to meet other vocal demands of work, home, and socialization.     OBJECTIVE IMPAIRMENTS include dysarthria. These impairments are limiting patient from effectively communicating at home and in community. Factors affecting potential to achieve goals and functional outcome are medical prognosis. Patient will benefit from skilled SLP services to address above impairments and improve overall function.  REHAB POTENTIAL: Good  PLAN: SLP FREQUENCY: 2x/week  SLP DURATION: 12 weeks  PLANNED INTERVENTIONS: Cueing hierachy, Internal/external aids, Functional tasks, SLP instruction and feedback, Compensatory strategies, Patient/family education, and Re-evaluation   Clyde Canterbury, M.S., CCC-SLP Speech-Language Pathologist [facility] [phone] Arnette Felts)   [facility] [address] [place], [place], [address] Phone: [phone]   Fax:  [phone]

## 2023-01-10 ENCOUNTER — Ambulatory Visit (INDEPENDENT_AMBULATORY_CARE_PROVIDER_SITE_OTHER): Payer: Medicare Other

## 2023-01-10 DIAGNOSIS — E538 Deficiency of other specified B group vitamins: Secondary | ICD-10-CM

## 2023-01-14 ENCOUNTER — Ambulatory Visit: Payer: Medicare Other

## 2023-01-14 DIAGNOSIS — R2681 Unsteadiness on feet: Secondary | ICD-10-CM

## 2023-01-14 DIAGNOSIS — M6281 Muscle weakness (generalized): Secondary | ICD-10-CM | POA: Diagnosis not present

## 2023-01-14 DIAGNOSIS — R262 Difficulty in walking, not elsewhere classified: Secondary | ICD-10-CM | POA: Diagnosis not present

## 2023-01-14 DIAGNOSIS — R471 Dysarthria and anarthria: Secondary | ICD-10-CM | POA: Diagnosis not present

## 2023-01-14 DIAGNOSIS — R278 Other lack of coordination: Secondary | ICD-10-CM | POA: Diagnosis not present

## 2023-01-14 NOTE — Therapy (Signed)
OUTPATIENT SPEECH LANGUAGE PATHOLOGY PARKINSON'S TREATMENT   Patient Name: Arthur Richardson MRN: [id number] DOB:Feb 14, 1951, 72 y.o., male Today's Date: 01/14/2023  PCP: Olevia Perches, DO  REFERRING PROVIDER: Cristopher Peru, MD   End of Session - 01/14/23 1148     Visit Number 3    Number of Visits 25    Date for SLP Re-Evaluation 03/27/23    Authorization Type [employer]    Progress Note Due on Visit 10    SLP Start Time 1055    SLP Stop Time  1145    SLP Time Calculation (min) 50 min             Past Medical History:  Diagnosis Date   Hyperlipidemia    Hypertension    Past Surgical History:  Procedure Laterality Date   LITHOTRIPSY  09/1998 & 07/29/1999   Due to nephrolithiasis   PROSTATE SURGERY     Biopsy= Negative   TONSILLECTOMY     Patient Active Problem List   Diagnosis Date Noted   B12 deficiency 12/19/2022   Primary Parkinson's disease 07/01/2022   Senile purpura (HCC) 07/01/2022   Chronic cluster headache, not intractable 07/30/2021   Peripheral edema 07/30/2021   Dyspnea on exertion 06/08/2020   Trigger point of left shoulder region 08/21/2017   BPH with obstruction/lower urinary tract symptoms 02/20/2017   Advanced care planning/counseling discussion 02/20/2017   DDD (degenerative disc disease), lumbar 08/21/2016   Nephrolithiasis 02/01/2016   Barrett's esophagus with esophagitis 10/11/2014   Trigeminal neuralgia 10/11/2014   Hypertension 10/11/2014   Hyperlipidemia 10/11/2014   Hypothyroidism 10/11/2014   Dysphagia 08/04/2014    ONSET DATE: 12/26/22 referral date; pt reports being diagnosed with Parkinson's Disease in November 2023  REFERRING DIAG: Parkinson's Disease  THERAPY DIAG:  Dysarthria and anarthria  Rationale for Evaluation and Treatment Rehabilitation  SUBJECTIVE:   SUBJECTIVE STATEMENT: Pt alert, pleasant, and cooperative. Pt using RW. Festinating gait appreciated. Pt accompanied by: significant other; wife, [name],  waited in waiting room    PERTINENT HISTORY: Pt 72 yo male referred to SLP for impairments due to Parkinson's Disease. PMHx significant for HTN, trigeminal neuralgia, barrett's esophagus with esophagitis, hypothyroidism, DDD, dysphagia (reports esophageal dilation in past), dyspnea on exertion, chronic cluster headache not intractable, peripheral edema, primary Parkinson's disease, B12 deficiency  DIAGNOSTIC FINDINGS: DaTSCAN, 02/2022, "Mark asymmetric decreased radiotracer activity in the RIGHT striatum is a finding typical of Parkinsonian syndrome pathology."  PAIN:  Are you having pain? No  FALLS: Has patient fallen in last 6 months?  No  LIVING ENVIRONMENT: Lives with: lives with their family; spouse and adult son  Lives in: Mobile home  PLOF:  Level of assistance: Comment: modified independent Employment: Retired, Other: works as a Geneticist, molecular   PATIENT GOALS    "for people to hear me"  OBJECTIVE TODAY'S TREATMENT: Reviewed of changes to voice in Parkinson's Disease and speaking loud and with intent. Reviewed HEP. With use of Voice Analyst App to capture acoustic data and provide biofeedback, pt completed as follows: Warm Up: May-Me-My-Moe-Moo - x10 with min/mod verbal cues 77-88dB Sustained "Ah": x10 with mod/max verbal cues; averaged 5s, 70-85dB, tendency to trail off and phonate on residual volume Ascending Pitch Glides: x10 with mod/max verbal cues; 131-168Hz , 75-85dB Descending Pitch Glides: x10 with mod/max verbal cues: 167-109Hz , 75-85dB  Oral reading of functional phrases loudly and intentionally: Mod verbal cues, 75-85 dB  Conversational speech: ~60-80dB, mod verbal cues to improve breath support due to speaking on residual volume  Reduced  carryover of compensations into informal exchanges noted. Additionally, pt benefited from rest breaks between ex's in HEP due to complaints of SOB.      PATIENT EDUCATION: Education details: HEP, POC, changes to voice in  Parkinson's Disease Person educated: Patient and Spouse Education method: Explanation and Handouts Education comprehension: verbalized understanding and needs further education   HOME EXERCISE PROGRAM: Yes - ex's as above and functional phrases for repetition   GOALS: Goals reviewed with patient? Yes  SHORT TERM GOALS: Target date: 10 sessions  Patient will demo HEP for dysarthria with min cues.  Baseline: Goal status: INITIAL  2.  Patient will maintain adequate vocal quality and intensity (>70dB) in sentence level responses 90% accuracy.  Baseline:  Goal status: INITIAL  3.  Patient will use dysarthria compensations in 2-3 sentence responses 90% accuracy with moderate cues.  Baseline:  Goal status: INITIAL  4.  Patient will complete PROM. Baseline:  Goal status: MET   LONG TERM GOALS: Target date: 12 weeks  Patient will demo HEP for dysarthria independently.  Baseline:  Goal status: INITIAL  2.  Patient will maintain intensity (avg >70dB) in 10 minutes conversation with modified independence  Baseline:  Goal status: INITIAL  3.  Patient will use dysarthria compensations (slow, loud, overpronounce, pause) for intelligibility >90% in 10 minutes conversation.  Baseline:  Goal status: INITIAL  4.  Patient will report improvement in PROM.  Baseline: VHI 73/120 (severe) Goal status: INITIAL    ASSESSMENT:  CLINICAL IMPRESSION: Patient is a 72  y.o. male who was seen today for speech therapy session in setting of Parkinson's Disease. Patient presents with at least a moderate hypokinetic dysarthria characterized by reduced vocal intensity, reduced breath support, reduced pitch variability, intermittent rapid rate, and waxing/waning vocal quality (hypophonic, hoarse, aphonic, near whisper). Pt with difficulty maintaining adequate vocal intensity and quality and well as short rushes of speech which impact intelligibility. See details of tx above. Pt is a good candidate  for intensive ST services; however, due to limited availability, we will utilize intensity-based approach 2x per week for 12 weeks. I recommend skilled ST to improve vocal quality, endurance, and intensity for improved intelligibility, and to meet other vocal demands of work, home, and socialization.     OBJECTIVE IMPAIRMENTS include dysarthria. These impairments are limiting patient from effectively communicating at home and in community. Factors affecting potential to achieve goals and functional outcome are medical prognosis. Patient will benefit from skilled SLP services to address above impairments and improve overall function.  REHAB POTENTIAL: Good  PLAN: SLP FREQUENCY: 2x/week  SLP DURATION: 12 weeks  PLANNED INTERVENTIONS: Cueing hierachy, Internal/external aids, Functional tasks, SLP instruction and feedback, Compensatory strategies, Patient/family education, and Re-evaluation   Clyde Canterbury, M.S., CCC-SLP Speech-Language Pathologist [facility] [phone] Arnette Felts)   [facility] [address] [place], [place], [address] Phone: [phone]   Fax:  [phone]

## 2023-01-14 NOTE — Therapy (Signed)
OUTPATIENT PHYSICAL THERAPY NEURO EVALUATION   Patient Name: Arthur Richardson MRN: [id number] DOB:1950-08-20, 72 y.o., male Today's Date: 01/14/2023   PCP: Dorcas Carrow, DO  REFERRING PROVIDER: Lonell Face, MD   END OF SESSION:  PT End of Session - 01/14/23 1144     Visit Number 4    Number of Visits 25    Date for PT Re-Evaluation 03/20/23    PT Start Time 1144    PT Stop Time 1226    PT Time Calculation (min) 42 min    Equipment Utilized During Treatment Gait belt    Activity Tolerance Patient tolerated treatment well    Behavior During Therapy WFL for tasks assessed/performed              Past Medical History:  Diagnosis Date   Hyperlipidemia    Hypertension    Past Surgical History:  Procedure Laterality Date   LITHOTRIPSY  09/1998 & 07/29/1999   Due to nephrolithiasis   PROSTATE SURGERY     Biopsy= Negative   TONSILLECTOMY     Patient Active Problem List   Diagnosis Date Noted   B12 deficiency 12/19/2022   Primary Parkinson's disease 07/01/2022   Senile purpura (HCC) 07/01/2022   Chronic cluster headache, not intractable 07/30/2021   Peripheral edema 07/30/2021   Dyspnea on exertion 06/08/2020   Trigger point of left shoulder region 08/21/2017   BPH with obstruction/lower urinary tract symptoms 02/20/2017   Advanced care planning/counseling discussion 02/20/2017   DDD (degenerative disc disease), lumbar 08/21/2016   Nephrolithiasis 02/01/2016   Barrett's esophagus with esophagitis 10/11/2014   Trigeminal neuralgia 10/11/2014   Hypertension 10/11/2014   Hyperlipidemia 10/11/2014   Hypothyroidism 10/11/2014   Dysphagia 08/04/2014    ONSET DATE: November 2023  REFERRING DIAG: 20.A1 (ICD-10-CM) - Parkinson's disease   THERAPY DIAG:  Difficulty in walking, not elsewhere classified  Muscle weakness (generalized)  Unsteadiness on feet  Other lack of coordination  Rationale for Evaluation and Treatment:  Rehabilitation  SUBJECTIVE:                                                                                                                                                                                             SUBJECTIVE STATEMENT:   Patient reports B LE aching this date likely due to the weather.   Pt accompanied by: self  PERTINENT HISTORY:   The pt is a pleasant 72 yo male referred to PT for impairments due to Parkinson's. He ambulates to session using RW.  Pt's primary concerns include decreased strength and poor standing endurance. Pt also reports if he sits for  too long he can have pain in BLE and hips and his legs can "lock up." Pt also with decreased endurance with ambulation, poor balance, and decreased UE/grip strength. Pt using RW today but uses a cane at home for shorter distances. Pt has difficulty with ADLs due to PD symptoms and his wife typically helps him. He has trouble with bed mobility, specifically rolling over, and difficulty with transfers. Pt avoids stairs. Pt reports freezing episodes. He says he was told to stop PD medications last neurology visit due to severe vertigo. He has follow-up with Dr. Sherryll Burger in December to try new PD medication. Pt's wife helps pt with ADLs. He is a taxidermist. He used to be active prior to onset of PD symptoms and would run a mile/day. Pt reports PT years ago for back pain. PMH significant for HTN, trigeminal neuralgia, barrett's esophagus with esophagitis, hypothyroidism, DDD, dysphagia, dyspnea on exertion, chronic cluster headache not intractable, peripheral edema, primary Parkinson's disease, B12 deficiency  PAIN:  Are you having pain? Yes: NPRS scale: not rated/10 Pain location: hips, legs  Pain description:   Aggravating factors: sitting too long Relieving factors: standing up briefly  PRECAUTIONS: Fall  RED FLAGS: Bowel or bladder incontinence: Yes: bladder   Hx of CA: negative Hx of compression fx: negative AAA: negative    WEIGHT BEARING RESTRICTIONS: No  FALLS: Has patient fallen in last 6 months? No, not in the last six months but 2 falls a year ago states "when my leg gets to jumping."   LIVING ENVIRONMENT: Lives with: lives with their spouse and son Lives in: Other double wide Stairs: Yes: External: 5 steps; bilateral hand-rails Has following equipment at home: Single point cane and Rettie Laird - 2 wheeled  PLOF: Independent  PATIENT GOALS: Improve strength and decrease pain  OBJECTIVE:   DIAGNOSTIC FINDINGS:    Via chart  03/05/2022 NM BRAIN "FINDINGS: There is significant loss of activity within the entire RIGHT striatum. Relative maintained activity in the head of the LEFT caudate nucleus. Loss of activity in the LEFT putamen.   IMPRESSION: Mark asymmetric decreased radiotracer activity in the RIGHT striatum is a finding typical of Parkinsonian syndrome pathology.   Of note, DaTSCAN is not diagnostic of Parkinsonian syndromes, which remains a clinical diagnosis. DaTscan is an adjuvant test to aid in the clinical diagnosis of Parkinsonian syndromes.     Electronically Signed   By: Genevive Bi M.D.   On: 03/05/2022 14:41"  COGNITION: Overall cognitive status: Within functional limits for tasks assessed   SENSATION: WFL to light touch with testing UE/LE  Pt reports some tingling felt in his low back  COORDINATION: Tremor in bilat UE Some difficulty with rapid alt UE movement WFL chin<>target (pen) WFL rapid alt movement LE  EDEMA:  Pt reports no swelling  MUSCLE TONE: tremor bilat UE, observed rigidity with movement   POSTURE: rounded shoulders, forward head, and increased thoracic kyphosis   LOWER EXTREMITY MMT:    Grossly 4/5 bilat LE, greatest deficits in hip mm, and hip flexors are pain-limited.    BED MOBILITY:  Imparied per pt report. Has difficulty rolling and getting out of his bed   TRANSFERS: Assistive device utilized: Environmental consultant - 2 wheeled  Sit to  stand: Modified independence Stand to sit: Modified independence Chair to chair: SBA    STAIRS: Reports he avoids stairs due to difficulty   GAIT: Gait pattern:  festinating gait, crouched posture Distance walked: , clinic distances Assistive device utilized: Environmental consultant - 2  wheeled Level of assistance: CGA Comments: with large-amplitude cuing for step-length pt able to improve during visit, significant difficulty with hypokinetic movement with turning, freezing   FUNCTIONAL TESTS:  5 times sit to stand: 31 sec with use of UE  : 0.67 m/s with RW  BERG 9/3: 26  TUG 9/3: 35.45sec  PATIENT SURVEYS:  FOTO 12 (goal 36)  TODAY'S TREATMENT:                                                                                                                              DATE: 01/14/23   Gait with RW x 180ft +42ft with supervision assist and multiple cues for targeted increase in step length through transitions to prevent freezing and festination. Pt noted to carry RW off ground unless experiencing freezing or festination.   Sit<>stand with BUE abduction with cues for counting out loud x 15 Sit<>stand with single UE raise upon head x 10 bil - cues for  Reciprocal march x 10 bil with UE supported on RW and visual target to hit knees on cross bar as able.   Gait with RW x 144ft +63ft with supervision assist and multiple cues for targeted increase in step length through transitions to prevent freezing and festination. Pt noted to carry RW off ground unless experiencing freezing or festination.   Seated reach to the floor and up to ceiling x 10 - counting out loud  Seated reach sideways to floor x 10 - counting out loud   Stepping over line in floor without UE support x 5 leading with each LE - intermittent use of UE   CGA-min assist throughout session with increased assist from PT with stepping tasks without AD to prevent lateral LOB     PATIENT EDUCATION: Education details:  recommendation for speech therapy (pt agreeable), exam findings, goals, plan Person educated: Patient Education method: Explanation, Demonstration, and Verbal cues Education comprehension: verbalized understanding, returned demonstration, verbal cues required, and needs further education  HOME EXERCISE PROGRAM: To be initiated    SHORT TERM GOALS: Target date: 02/06/2023   Patient will be independent in home exercise program to improve strength/mobility for better functional independence with ADLs. Baseline: to be initiated Goal status: INITIAL   LONG TERM GOALS: Target date: 03/20/2023    Patient will increase FOTO score to equal to or greater than  36   to demonstrate statistically significant improvement in mobility and quality of life.  Baseline: 12 Goal status: INITIAL  2.  Patient (> 35 years old) will complete five times sit to stand test in < 15 seconds indicating an increased LE strength and improved balance. Baseline: 31 sec use of BUE to push off chair Goal status: INITIAL  3.  Patient will increase Berg Balance score by > 6 points to demonstrate decreased fall risk during functional activities Baseline: 26 Goal status: INITIAL  4.  Patient will increase 10 meter walk test to >1.16m/s as to improve gait  speed for better community ambulation and to reduce fall risk. Baseline: 0.67 m/s with RW Goal status: INITIAL  5.  Patient will reduce timed up and go to <11 seconds to reduce fall risk and demonstrate improved transfer/gait ability. Baseline: 35.45sec  Goal status: INITIAL    ASSESSMENT:  CLINICAL IMPRESSION:  Patient present to PT with increased "achiness" per self report. PT session focused on improved BUE/BLE large amplitude movements. Tolerated treatment session well with seated rest breaks between exercises due to fatigue. The pt will benefit from further skilled PT to address impairments in order to increase functional mobility, ADL ability and decrease  fall risk.    OBJECTIVE IMPAIRMENTS: Abnormal gait, decreased activity tolerance, decreased balance, decreased coordination, decreased endurance, decreased mobility, difficulty walking, decreased ROM, decreased strength, impaired flexibility, impaired tone, impaired UE functional use, improper body mechanics, postural dysfunction, and pain.   ACTIVITY LIMITATIONS: carrying, lifting, bending, sitting, standing, squatting, stairs, transfers, bed mobility, continence, bathing, toileting, dressing, hygiene/grooming, and locomotion level  PARTICIPATION LIMITATIONS: meal prep, cleaning, laundry, driving, shopping, community activity, occupation, and yard work  PERSONAL FACTORS: Age and 3+ comorbidities: PMH significant for HTN, trigeminal neuralgia, barrett's esophagus with esophagitis, hypothyroidism, DDD, dysphagia, dyspnea on exertion, chronic cluster headache not intractable, peripheral edema, primary Parkinson's disease, B12 deficiency  are also affecting patient's functional outcome.   REHAB POTENTIAL: Good  CLINICAL DECISION MAKING: Evolving/moderate complexity  EVALUATION COMPLEXITY: Moderate  PLAN:  PT FREQUENCY: 2x/week  PT DURATION: 12 weeks  PLANNED INTERVENTIONS: Therapeutic exercises, Therapeutic activity, Neuromuscular re-education, Balance training, Gait training, Patient/Family education, Self Care, Joint mobilization, Stair training, Vestibular training, Canalith repositioning, Visual/preceptual remediation/compensation, Orthotic/Fit training, DME instructions, Electrical stimulation, Spinal mobilization, Cryotherapy, Moist heat, Splintting, Taping, Manual therapy, and Re-evaluation  PLAN FOR NEXT SESSION:   Continue to advance HEP. Rotation movements and improved step length.   Viviann Spare, PT, DPT 01/14/2023, 12:27 PM

## 2023-01-15 ENCOUNTER — Other Ambulatory Visit: Payer: Self-pay | Admitting: Family Medicine

## 2023-01-15 DIAGNOSIS — I10 Essential (primary) hypertension: Secondary | ICD-10-CM

## 2023-01-15 NOTE — Therapy (Unsigned)
OUTPATIENT PHYSICAL THERAPY NEURO TREATMENT   Patient Name: Arthur Richardson MRN: [id number] DOB:[birth year], 72 y.o., male Today's Date: 01/16/2023   PCP: Dorcas Carrow, DO  REFERRING PROVIDER: Lonell Face, MD   END OF SESSION:  PT End of Session - 01/16/23 0858     Visit Number 5    Number of Visits 25    Date for PT Re-Evaluation 03/20/23    Progress Note Due on Visit 10    PT Start Time 0850    PT Stop Time 0929    PT Time Calculation (min) 39 min    Equipment Utilized During Treatment Gait belt    Activity Tolerance Patient tolerated treatment well    Behavior During Therapy [facility] for tasks assessed/performed               Past Medical History:  Diagnosis Date   Hyperlipidemia    Hypertension    Past Surgical History:  Procedure Laterality Date   LITHOTRIPSY  09/1998 & 07/29/1999   Due to nephrolithiasis   PROSTATE SURGERY     Biopsy= Negative   TONSILLECTOMY     Patient Active Problem List   Diagnosis Date Noted   B12 deficiency 12/19/2022   Primary Parkinson's disease 07/01/2022   Senile purpura (HCC) 07/01/2022   Chronic cluster headache, not intractable 07/30/2021   Peripheral edema 07/30/2021   Dyspnea on exertion 06/08/2020   Trigger point of left shoulder region 08/21/2017   BPH with obstruction/lower urinary tract symptoms 02/20/2017   Advanced care planning/counseling discussion 02/20/2017   DDD (degenerative disc disease), lumbar 08/21/2016   Nephrolithiasis 02/01/2016   Barrett's esophagus with esophagitis 10/11/2014   Trigeminal neuralgia 10/11/2014   Hypertension 10/11/2014   Hyperlipidemia 10/11/2014   Hypothyroidism 10/11/2014   Dysphagia 08/04/2014    ONSET DATE: November 2023  REFERRING DIAG: 20.A1 (ICD-10-CM) - Parkinson's disease   THERAPY DIAG:  Difficulty in walking, not elsewhere classified  Muscle weakness (generalized)  Unsteadiness on feet  Rationale for Evaluation and Treatment:  Rehabilitation  SUBJECTIVE:                                                                                                                                                                                             SUBJECTIVE STATEMENT:   Patient severe soreness secondary to standing for over an hour at [employer] yesterday, reports the seated scooter was not there.   Pt accompanied by: self  PERTINENT HISTORY:   The pt is a pleasant 72 yo male referred to PT for impairments due to Parkinson's. He ambulates to session using RW.  Pt's primary concerns include  decreased strength and poor standing endurance. Pt also reports if he sits for too long he can have pain in BLE and hips and his legs can "lock up." Pt also with decreased endurance with ambulation, poor balance, and decreased UE/grip strength. Pt using RW today but uses a cane at home for shorter distances. Pt has difficulty with ADLs due to PD symptoms and his wife typically helps him. He has trouble with bed mobility, specifically rolling over, and difficulty with transfers. Pt avoids stairs. Pt reports freezing episodes. He says he was told to stop PD medications last neurology visit due to severe vertigo. He has follow-up with Dr. Sherryll Burger in December to try new PD medication. Pt's wife helps pt with ADLs. He is a taxidermist. He used to be active prior to onset of PD symptoms and would run a mile/day. Pt reports PT years ago for back pain. PMH significant for HTN, trigeminal neuralgia, barrett's esophagus with esophagitis, hypothyroidism, DDD, dysphagia, dyspnea on exertion, chronic cluster headache not intractable, peripheral edema, primary Parkinson's disease, B12 deficiency  PAIN:  Are you having pain? Yes: NPRS scale: not rated/10 Pain location: hips, legs  Pain description:   Aggravating factors: sitting too long Relieving factors: standing up briefly  PRECAUTIONS: Fall  RED FLAGS: Bowel or bladder incontinence: Yes: bladder   Hx  of CA: negative Hx of compression fx: negative AAA: negative   WEIGHT BEARING RESTRICTIONS: No  FALLS: Has patient fallen in last 6 months? No, not in the last six months but 2 falls a year ago states "when my leg gets to jumping."   LIVING ENVIRONMENT: Lives with: lives with their spouse and son Lives in: Other double wide Stairs: Yes: External: 5 steps; bilateral hand-rails Has following equipment at home: Single point cane and Walker - 2 wheeled  PLOF: Independent  PATIENT GOALS: Improve strength and decrease pain  OBJECTIVE:   DIAGNOSTIC FINDINGS:    Via chart  03/05/2022 NM BRAIN "FINDINGS: There is significant loss of activity within the entire RIGHT striatum. Relative maintained activity in the head of the LEFT caudate nucleus. Loss of activity in the LEFT putamen.   IMPRESSION: Mark asymmetric decreased radiotracer activity in the RIGHT striatum is a finding typical of Parkinsonian syndrome pathology.   Of note, DaTSCAN is not diagnostic of Parkinsonian syndromes, which remains a clinical diagnosis. DaTscan is an adjuvant test to aid in the clinical diagnosis of Parkinsonian syndromes.     Electronically Signed   By: Genevive Bi M.D.   On: 03/05/2022 14:41"  COGNITION: Overall cognitive status: Within functional limits for tasks assessed   SENSATION: WFL to light touch with testing UE/LE  Pt reports some tingling felt in his low back  COORDINATION: Tremor in bilat UE Some difficulty with rapid alt UE movement WFL chin<>target (pen) WFL rapid alt movement LE  EDEMA:  Pt reports no swelling  MUSCLE TONE: tremor bilat UE, observed rigidity with movement   POSTURE: rounded shoulders, forward head, and increased thoracic kyphosis   LOWER EXTREMITY MMT:    Grossly 4/5 bilat LE, greatest deficits in hip mm, and hip flexors are pain-limited.    BED MOBILITY:  Imparied per pt report. Has difficulty rolling and getting out of his  bed   TRANSFERS: Assistive device utilized: Environmental consultant - 2 wheeled  Sit to stand: Modified independence Stand to sit: Modified independence Chair to chair: SBA    STAIRS: Reports he avoids stairs due to difficulty   GAIT: Gait pattern:  festinating gait,  crouched posture Distance walked: , clinic distances Assistive device utilized: Walker - 2 wheeled Level of assistance: CGA Comments: with large-amplitude cuing for step-length pt able to improve during visit, significant difficulty with hypokinetic movement with turning, freezing   FUNCTIONAL TESTS:  5 times sit to stand: 31 sec with use of UE  : 0.67 m/s with RW  BERG 9/3: 26  TUG 9/3: 35.45sec  PATIENT SURVEYS:  FOTO 12 (goal 36)  TODAY'S TREATMENT:                                                                                                                              DATE: 01/16/23   TE  Nustep level 0 x 6 min for B UE and LE reciprocal movement and for aerobic priming  Seated PWR moves x 10 ea  - UP, rock, twist, and step   Standing PWR! Step 2 x 10 ea  - difficulty with adductor activation returning LE to start position   Bolster adduction 10 x 3 sec holds     CGA-min assist throughout session with increased assist from PT with stepping tasks without AD to prevent lateral LOB     PATIENT EDUCATION: Education details: recommendation for speech therapy (pt agreeable), exam findings, goals, plan Person educated: Patient Education method: Explanation, Demonstration, and Verbal cues Education comprehension: verbalized understanding, returned demonstration, verbal cues required, and needs further education  HOME EXERCISE PROGRAM: To be initiated    SHORT TERM GOALS: Target date: 02/06/2023   Patient will be independent in home exercise program to improve strength/mobility for better functional independence with ADLs. Baseline: to be initiated Goal status: INITIAL   LONG TERM GOALS:  Target date: 03/20/2023    Patient will increase FOTO score to equal to or greater than  36   to demonstrate statistically significant improvement in mobility and quality of life.  Baseline: 12 Goal status: INITIAL  2.  Patient (> 39 years old) will complete five times sit to stand test in < 15 seconds indicating an increased LE strength and improved balance. Baseline: 31 sec use of BUE to push off chair Goal status: INITIAL  3.  Patient will increase Berg Balance score by > 6 points to demonstrate decreased fall risk during functional activities Baseline: 26 Goal status: INITIAL  4.  Patient will increase 10 meter walk test to >1.65m/s as to improve gait speed for better community ambulation and to reduce fall risk. Baseline: 0.67 m/s with RW Goal status: INITIAL  5.  Patient will reduce timed up and go to <11 seconds to reduce fall risk and demonstrate improved transfer/gait ability. Baseline: 35.45sec  Goal status: INITIAL    ASSESSMENT:  CLINICAL IMPRESSION:  Soreness following a long day walking around [employer] yesterday. Pt introduced to PWR! Specific exercises to target various aspects of PD with overall good ability to complete. Pt still having a lot of difficulty initiating movement and with transitions. Pt will continue to benefit  from skilled physical therapy intervention to address impairments, improve QOL, and attain therapy goals.    OBJECTIVE IMPAIRMENTS: Abnormal gait, decreased activity tolerance, decreased balance, decreased coordination, decreased endurance, decreased mobility, difficulty walking, decreased ROM, decreased strength, impaired flexibility, impaired tone, impaired UE functional use, improper body mechanics, postural dysfunction, and pain.   ACTIVITY LIMITATIONS: carrying, lifting, bending, sitting, standing, squatting, stairs, transfers, bed mobility, continence, bathing, toileting, dressing, hygiene/grooming, and locomotion level  PARTICIPATION  LIMITATIONS: meal prep, cleaning, laundry, driving, shopping, community activity, occupation, and yard work  PERSONAL FACTORS: Age and 3+ comorbidities: PMH significant for HTN, trigeminal neuralgia, barrett's esophagus with esophagitis, hypothyroidism, DDD, dysphagia, dyspnea on exertion, chronic cluster headache not intractable, peripheral edema, primary Parkinson's disease, B12 deficiency  are also affecting patient's functional outcome.   REHAB POTENTIAL: Good  CLINICAL DECISION MAKING: Evolving/moderate complexity  EVALUATION COMPLEXITY: Moderate  PLAN:  PT FREQUENCY: 2x/week  PT DURATION: 12 weeks  PLANNED INTERVENTIONS: Therapeutic exercises, Therapeutic activity, Neuromuscular re-education, Balance training, Gait training, Patient/Family education, Self Care, Joint mobilization, Stair training, Vestibular training, Canalith repositioning, Visual/preceptual remediation/compensation, Orthotic/Fit training, DME instructions, Electrical stimulation, Spinal mobilization, Cryotherapy, Moist heat, Splintting, Taping, Manual therapy, and Re-evaluation  PLAN FOR NEXT SESSION:   Continue to advance HEP. Rotation movements and improved step length. Various planes of movement initiation.   Norman Herrlich, PT, DPT 01/16/2023, 8:59 AM

## 2023-01-16 ENCOUNTER — Ambulatory Visit: Payer: Medicare Other | Admitting: Physical Therapy

## 2023-01-16 ENCOUNTER — Ambulatory Visit: Payer: Medicare Other

## 2023-01-16 DIAGNOSIS — M6281 Muscle weakness (generalized): Secondary | ICD-10-CM | POA: Diagnosis not present

## 2023-01-16 DIAGNOSIS — R471 Dysarthria and anarthria: Secondary | ICD-10-CM | POA: Diagnosis not present

## 2023-01-16 DIAGNOSIS — R262 Difficulty in walking, not elsewhere classified: Secondary | ICD-10-CM | POA: Diagnosis not present

## 2023-01-16 DIAGNOSIS — R278 Other lack of coordination: Secondary | ICD-10-CM | POA: Diagnosis not present

## 2023-01-16 DIAGNOSIS — R2681 Unsteadiness on feet: Secondary | ICD-10-CM | POA: Diagnosis not present

## 2023-01-16 NOTE — Therapy (Signed)
OUTPATIENT SPEECH LANGUAGE PATHOLOGY PARKINSON'S TREATMENT   Patient Name: Arthur Richardson MRN: [id number] DOB:May 20, 1950, 72 y.o., male Today's Date: 01/16/2023  PCP: Olevia Perches, DO  REFERRING PROVIDER: Cristopher Peru, MD   End of Session - 01/16/23 0924     Visit Number 4    Number of Visits 25    Date for SLP Re-Evaluation 03/27/23    Authorization Type [employer] Medicare    Progress Note Due on Visit 10    SLP Start Time 0930    SLP Stop Time  1015    SLP Time Calculation (min) 45 min    Activity Tolerance Patient tolerated treatment well             Past Medical History:  Diagnosis Date   Hyperlipidemia    Hypertension    Past Surgical History:  Procedure Laterality Date   LITHOTRIPSY  09/1998 & 07/29/1999   Due to nephrolithiasis   PROSTATE SURGERY     Biopsy= Negative   TONSILLECTOMY     Patient Active Problem List   Diagnosis Date Noted   B12 deficiency 12/19/2022   Primary Parkinson's disease 07/01/2022   Senile purpura (HCC) 07/01/2022   Chronic cluster headache, not intractable 07/30/2021   Peripheral edema 07/30/2021   Dyspnea on exertion 06/08/2020   Trigger point of left shoulder region 08/21/2017   BPH with obstruction/lower urinary tract symptoms 02/20/2017   Advanced care planning/counseling discussion 02/20/2017   DDD (degenerative disc disease), lumbar 08/21/2016   Nephrolithiasis 02/01/2016   Barrett's esophagus with esophagitis 10/11/2014   Trigeminal neuralgia 10/11/2014   Hypertension 10/11/2014   Hyperlipidemia 10/11/2014   Hypothyroidism 10/11/2014   Dysphagia 08/04/2014    ONSET DATE: 12/26/22 referral date; pt reports being diagnosed with Parkinson's Disease in November 2023  REFERRING DIAG: Parkinson's Disease  THERAPY DIAG:  Dysarthria and anarthria  Rationale for Evaluation and Treatment Rehabilitation  SUBJECTIVE:   SUBJECTIVE STATEMENT: Pt alert, pleasant, and cooperative. Greeted SLP in gym with strong, clear  voice. Pt using RW. Festinating gait appreciated. Pt accompanied by: significant other; wife, [name], waited in waiting room    PERTINENT HISTORY: Pt 72 yo male referred to SLP for impairments due to Parkinson's Disease. PMHx significant for HTN, trigeminal neuralgia, barrett's esophagus with esophagitis, hypothyroidism, DDD, dysphagia (reports esophageal dilation in past), dyspnea on exertion, chronic cluster headache not intractable, peripheral edema, primary Parkinson's disease, B12 deficiency  DIAGNOSTIC FINDINGS: DaTSCAN, 02/2022, "Mark asymmetric decreased radiotracer activity in the RIGHT striatum is a finding typical of Parkinsonian syndrome pathology."  PAIN:  Are you having pain? No  FALLS: Has patient fallen in last 6 months?  No  LIVING ENVIRONMENT: Lives with: lives with their family; spouse and adult son  Lives in: Mobile home  PLOF:  Level of assistance: Comment: modified independent Employment: Retired, Other: works as a Geneticist, molecular   PATIENT GOALS    "for people to hear me"  OBJECTIVE TODAY'S TREATMENT: Reviewed of changes to voice in Parkinson's Disease and speaking loud and with intent. Reviewed HEP. With use of Voice Analyst App to capture acoustic data and provide biofeedback, pt completed as follows: Warm Up: May-Me-My-Moe-Moo - x10 with min verbal cues 75-85dB Sustained "Ah": x10 with mod verbal cues; averaged 5s, 70-85 dB, tendency to trail off and phonate on residual volume Ascending Pitch Glides: x10 with mod/max verbal cues; 135-168Hz , 75-85dB Descending Pitch Glides: x10 with mod/max verbal cues: 168-113Hz , 75-85dB  Oral reading of functional phrases loudly and intentionally: Min verbal cues, 75-85 dB  Conversational speech: ~60-80dB, mod verbal cues to improve breath support due to speaking on residual volume  Reduced carryover of compensations into informal exchanges noted. Additionally, pt benefited from rest breaks between ex's in HEP due to complaints  of SOB.      PATIENT EDUCATION: Education details: HEP, POC, changes to voice in Parkinson's Disease Person educated: Patient and Spouse Education method: Explanation and Handouts Education comprehension: verbalized understanding and needs further education   HOME EXERCISE PROGRAM: Yes - ex's as above and functional phrases for repetition   GOALS: Goals reviewed with patient? Yes  SHORT TERM GOALS: Target date: 10 sessions  Patient will demo HEP for dysarthria with min cues.  Baseline: Goal status: INITIAL  2.  Patient will maintain adequate vocal quality and intensity (>70dB) in sentence level responses 90% accuracy.  Baseline:  Goal status: INITIAL  3.  Patient will use dysarthria compensations in 2-3 sentence responses 90% accuracy with moderate cues.  Baseline:  Goal status: INITIAL  4.  Patient will complete PROM. Baseline:  Goal status: MET   LONG TERM GOALS: Target date: 12 weeks  Patient will demo HEP for dysarthria independently.  Baseline:  Goal status: INITIAL  2.  Patient will maintain intensity (avg >70dB) in 10 minutes conversation with modified independence  Baseline:  Goal status: INITIAL  3.  Patient will use dysarthria compensations (slow, loud, overpronounce, pause) for intelligibility >90% in 10 minutes conversation.  Baseline:  Goal status: INITIAL  4.  Patient will report improvement in PROM.  Baseline: VHI 73/120 (severe) Goal status: INITIAL    ASSESSMENT:  CLINICAL IMPRESSION: Patient is a 72  y.o. male who was seen today for speech therapy session in setting of Parkinson's Disease. Patient presents with at least a moderate hypokinetic dysarthria characterized by reduced vocal intensity, reduced breath support, reduced pitch variability, intermittent rapid rate, and waxing/waning vocal quality (hypophonic, hoarse, aphonic, near whisper). Pt with difficulty maintaining adequate vocal intensity and quality and well as short rushes of  speech which impact intelligibility. See details of tx above. Pt is a good candidate for intensive ST services; however, due to limited availability, we will utilize intensity-based approach 2x per week for 12 weeks. I recommend skilled ST to improve vocal quality, endurance, and intensity for improved intelligibility, and to meet other vocal demands of work, home, and socialization.     OBJECTIVE IMPAIRMENTS include dysarthria. These impairments are limiting patient from effectively communicating at home and in community. Factors affecting potential to achieve goals and functional outcome are medical prognosis. Patient will benefit from skilled SLP services to address above impairments and improve overall function.  REHAB POTENTIAL: Good  PLAN: SLP FREQUENCY: 2x/week  SLP DURATION: 12 weeks  PLANNED INTERVENTIONS: Cueing hierachy, Internal/external aids, Functional tasks, SLP instruction and feedback, Compensatory strategies, Patient/family education, and Re-evaluation   Clyde Canterbury, M.S., CCC-SLP Speech-Language Pathologist [facility] [phone] Arnette Felts)   [place] [facility] [place] [facility] [address] [place], [place], [address] Phone: [phone]   Fax:  [phone]

## 2023-01-16 NOTE — Telephone Encounter (Signed)
Requested Prescriptions  Pending Prescriptions Disp Refills   losartan (COZAAR) 50 MG tablet [Pharmacy Med Name: Losartan Potassium 50 MG Oral Tablet] 100 tablet 0    Sig: TAKE 1 TABLET BY MOUTH DAILY     Cardiovascular:  Angiotensin Receptor Blockers Passed - 01/15/2023  4:55 AM      Passed - Cr in normal range and within 180 days    Creatinine, Ser  Date Value Ref Range Status  10/28/2022 1.10 0.76 - 1.27 mg/dL Final         Passed - K in normal range and within 180 days    Potassium  Date Value Ref Range Status  10/28/2022 4.3 3.5 - 5.2 mmol/L Final         Passed - Patient is not pregnant      Passed - Last BP in normal range    BP Readings from Last 1 Encounters:  10/28/22 132/82         Passed - Valid encounter within last 6 months    Recent Outpatient Visits           2 months ago Hypothyroidism, unspecified type   [facility] [place], Megan P, DO   5 months ago Essential hypertension   [place] [facility] [place], Megan P, DO   6 months ago Routine general medical examination at a health care facility   [facility] [place], Megan P, DO   9 months ago BPH with obstruction/lower urinary tract symptoms   [place] [facility] [place], Megan P, DO   11 months ago Hypothyroidism, unspecified type   [facility] [place], Oralia Rud, DO       Future Appointments             In 1 month Johnson, Oralia Rud, DO [place] [facility]   In 5 months Richardo Hanks, Laurette Schimke, MD [facility] [place]

## 2023-01-20 ENCOUNTER — Ambulatory Visit: Payer: Medicare Other

## 2023-01-20 ENCOUNTER — Ambulatory Visit: Payer: Medicare Other | Admitting: Physical Therapy

## 2023-01-20 ENCOUNTER — Encounter: Payer: Self-pay | Admitting: Physical Therapy

## 2023-01-20 DIAGNOSIS — M6281 Muscle weakness (generalized): Secondary | ICD-10-CM | POA: Diagnosis not present

## 2023-01-20 DIAGNOSIS — R262 Difficulty in walking, not elsewhere classified: Secondary | ICD-10-CM | POA: Diagnosis not present

## 2023-01-20 DIAGNOSIS — R278 Other lack of coordination: Secondary | ICD-10-CM | POA: Diagnosis not present

## 2023-01-20 DIAGNOSIS — R2681 Unsteadiness on feet: Secondary | ICD-10-CM

## 2023-01-20 DIAGNOSIS — R471 Dysarthria and anarthria: Secondary | ICD-10-CM | POA: Diagnosis not present

## 2023-01-20 NOTE — Therapy (Signed)
OUTPATIENT PHYSICAL THERAPY NEURO TREATMENT   Patient Name: Arthur Richardson MRN: [id number] DOB:1950-07-21, 72 y.o., male Today's Date: 01/20/2023   PCP: Dorcas Carrow, DO  REFERRING PROVIDER: Lonell Face, MD   END OF SESSION:  PT End of Session - 01/20/23 1054     Visit Number 6    Number of Visits 25    Date for PT Re-Evaluation 03/20/23    Progress Note Due on Visit 10    PT Start Time 1100    PT Stop Time 1143    PT Time Calculation (min) 43 min    Equipment Utilized During Treatment Gait belt    Activity Tolerance Patient tolerated treatment well    Behavior During Therapy WFL for tasks assessed/performed                Past Medical History:  Diagnosis Date   Hyperlipidemia    Hypertension    Past Surgical History:  Procedure Laterality Date   LITHOTRIPSY  09/1998 & 07/29/1999   Due to nephrolithiasis   PROSTATE SURGERY     Biopsy= Negative   TONSILLECTOMY     Patient Active Problem List   Diagnosis Date Noted   B12 deficiency 12/19/2022   Primary Parkinson's disease 07/01/2022   Senile purpura (HCC) 07/01/2022   Chronic cluster headache, not intractable 07/30/2021   Peripheral edema 07/30/2021   Dyspnea on exertion 06/08/2020   Trigger point of left shoulder region 08/21/2017   BPH with obstruction/lower urinary tract symptoms 02/20/2017   Advanced care planning/counseling discussion 02/20/2017   DDD (degenerative disc disease), lumbar 08/21/2016   Nephrolithiasis 02/01/2016   Barrett's esophagus with esophagitis 10/11/2014   Trigeminal neuralgia 10/11/2014   Hypertension 10/11/2014   Hyperlipidemia 10/11/2014   Hypothyroidism 10/11/2014   Dysphagia 08/04/2014    ONSET DATE: November 2023  REFERRING DIAG: 20.A1 (ICD-10-CM) - Parkinson's disease   THERAPY DIAG:  Difficulty in walking, not elsewhere classified  Muscle weakness (generalized)  Unsteadiness on feet  Rationale for Evaluation and Treatment:  Rehabilitation  SUBJECTIVE:                                                                                                                                                                                             SUBJECTIVE STATEMENT:   Pt reports a near fall yesterday where he fell into his chair. He says he is a little sore from that.   Pt accompanied by: self  PERTINENT HISTORY:   The pt is a pleasant 72 yo male referred to PT for impairments due to Parkinson's. He ambulates to session using RW.  Pt's primary  concerns include decreased strength and poor standing endurance. Pt also reports if he sits for too long he can have pain in BLE and hips and his legs can "lock up." Pt also with decreased endurance with ambulation, poor balance, and decreased UE/grip strength. Pt using RW today but uses a cane at home for shorter distances. Pt has difficulty with ADLs due to PD symptoms and his wife typically helps him. He has trouble with bed mobility, specifically rolling over, and difficulty with transfers. Pt avoids stairs. Pt reports freezing episodes. He says he was told to stop PD medications last neurology visit due to severe vertigo. He has follow-up with Dr. Sherryll Burger in December to try new PD medication. Pt's wife helps pt with ADLs. He is a taxidermist. He used to be active prior to onset of PD symptoms and would run a mile/day. Pt reports PT years ago for back pain. PMH significant for HTN, trigeminal neuralgia, barrett's esophagus with esophagitis, hypothyroidism, DDD, dysphagia, dyspnea on exertion, chronic cluster headache not intractable, peripheral edema, primary Parkinson's disease, B12 deficiency  PAIN:  Are you having pain? Yes: NPRS scale: not rated/10 Pain location: hips, legs  Pain description:   Aggravating factors: sitting too long Relieving factors: standing up briefly  PRECAUTIONS: Fall  RED FLAGS: Bowel or bladder incontinence: Yes: bladder   Hx of CA: negative Hx of  compression fx: negative AAA: negative   WEIGHT BEARING RESTRICTIONS: No  FALLS: Has patient fallen in last 6 months? No, not in the last six months but 2 falls a year ago states "when my leg gets to jumping."   LIVING ENVIRONMENT: Lives with: lives with their spouse and son Lives in: Other double wide Stairs: Yes: External: 5 steps; bilateral hand-rails Has following equipment at home: Single point cane and Walker - 2 wheeled  PLOF: Independent  PATIENT GOALS: Improve strength and decrease pain  OBJECTIVE:   DIAGNOSTIC FINDINGS:    Via chart  03/05/2022 NM BRAIN "FINDINGS: There is significant loss of activity within the entire RIGHT striatum. Relative maintained activity in the head of the LEFT caudate nucleus. Loss of activity in the LEFT putamen.   IMPRESSION: Mark asymmetric decreased radiotracer activity in the RIGHT striatum is a finding typical of Parkinsonian syndrome pathology.   Of note, DaTSCAN is not diagnostic of Parkinsonian syndromes, which remains a clinical diagnosis. DaTscan is an adjuvant test to aid in the clinical diagnosis of Parkinsonian syndromes.     Electronically Signed   By: Genevive Bi M.D.   On: 03/05/2022 14:41"  COGNITION: Overall cognitive status: Within functional limits for tasks assessed   SENSATION: WFL to light touch with testing UE/LE  Pt reports some tingling felt in his low back  COORDINATION: Tremor in bilat UE Some difficulty with rapid alt UE movement WFL chin<>target (pen) WFL rapid alt movement LE  EDEMA:  Pt reports no swelling  MUSCLE TONE: tremor bilat UE, observed rigidity with movement   POSTURE: rounded shoulders, forward head, and increased thoracic kyphosis   LOWER EXTREMITY MMT:    Grossly 4/5 bilat LE, greatest deficits in hip mm, and hip flexors are pain-limited.    BED MOBILITY:  Imparied per pt report. Has difficulty rolling and getting out of his bed   TRANSFERS: Assistive device  utilized: Environmental consultant - 2 wheeled  Sit to stand: Modified independence Stand to sit: Modified independence Chair to chair: SBA    STAIRS: Reports he avoids stairs due to difficulty   GAIT: Gait pattern:  festinating gait, crouched posture Distance walked: , clinic distances Assistive device utilized: Walker - 2 wheeled Level of assistance: CGA Comments: with large-amplitude cuing for step-length pt able to improve during visit, significant difficulty with hypokinetic movement with turning, freezing   FUNCTIONAL TESTS:  5 times sit to stand: 31 sec with use of UE  : 0.67 m/s with RW  BERG 9/3: 26  TUG 9/3: 35.45sec  PATIENT SURVEYS:  FOTO 12 (goal 36)  TODAY'S TREATMENT:                                                                                                                              DATE: 01/20/23   TA  Trial of up walker and u step walker. Improved turnign ability with U step walker compared to 2WW and up walker. With up walker pt tends to push walker anterior and it tries to get away from him. X 150 ft each round   NMR Seated PWR moves x 10 ea  - UP, rock, twist, and step   -modified PWR! Twist to prevent L shoulder pain ( turned into just trunk rotation)  -with PWR! Step used metronome at 50 BPM   TE Bolster adduction 20 x 3 sec holds   TA  Ambulation with 2WW and 60 BPM metronome. Cues to focus on metronome. Improved efficacy with turning, still quick to fatigue and difficulty with doorways.  -x250 ft   CGA-min assist throughout session with increased assist from PT with stepping tasks without AD to prevent lateral LOB   Pt required occasional therapeutic rest breaks due fatigue, PT was quick to ask when pt appeared to be fatiguing in order to prevent excessive fatigue.   PATIENT EDUCATION: Education details: recommendation for speech therapy (pt agreeable), exam findings, goals, plan Person educated: Patient Education method: Explanation,  Demonstration, and Verbal cues Education comprehension: verbalized understanding, returned demonstration, verbal cues required, and needs further education  HOME EXERCISE PROGRAM: To be initiated    SHORT TERM GOALS: Target date: 02/06/2023   Patient will be independent in home exercise program to improve strength/mobility for better functional independence with ADLs. Baseline: to be initiated Goal status: INITIAL   LONG TERM GOALS: Target date: 03/20/2023    Patient will increase FOTO score to equal to or greater than  36   to demonstrate statistically significant improvement in mobility and quality of life.  Baseline: 12 Goal status: INITIAL  2.  Patient (> 35 years old) will complete five times sit to stand test in < 15 seconds indicating an increased LE strength and improved balance. Baseline: 31 sec use of BUE to push off chair Goal status: INITIAL  3.  Patient will increase Berg Balance score by > 6 points to demonstrate decreased fall risk during functional activities Baseline: 26 Goal status: INITIAL  4.  Patient will increase 10 meter walk test to >1.67m/s as to improve gait speed for better community ambulation and to reduce  fall risk. Baseline: 0.67 m/s with RW Goal status: INITIAL  5.  Patient will reduce timed up and go to <11 seconds to reduce fall risk and demonstrate improved transfer/gait ability. Baseline: 35.45sec  Goal status: INITIAL    ASSESSMENT:  CLINICAL IMPRESSION:   Pt introduced to some alternative Assistive devices to improve his mobility.  Pt shows most improvement with u step and with use of metronome and focus on this to improve his freezing of gait. Up walker not stable enough and pt felt he could not slow down and felt he was going down hill. Pt continues with  PWR! Specific exercises to target various aspects of PD with overall good ability to complete. Pt still having a lot of difficulty initiating movement and with transitions. Pt will  continue to benefit from skilled physical therapy intervention to address impairments, improve QOL, and attain therapy goals.    OBJECTIVE IMPAIRMENTS: Abnormal gait, decreased activity tolerance, decreased balance, decreased coordination, decreased endurance, decreased mobility, difficulty walking, decreased ROM, decreased strength, impaired flexibility, impaired tone, impaired UE functional use, improper body mechanics, postural dysfunction, and pain.   ACTIVITY LIMITATIONS: carrying, lifting, bending, sitting, standing, squatting, stairs, transfers, bed mobility, continence, bathing, toileting, dressing, hygiene/grooming, and locomotion level  PARTICIPATION LIMITATIONS: meal prep, cleaning, laundry, driving, shopping, community activity, occupation, and yard work  PERSONAL FACTORS: Age and 3+ comorbidities: PMH significant for HTN, trigeminal neuralgia, barrett's esophagus with esophagitis, hypothyroidism, DDD, dysphagia, dyspnea on exertion, chronic cluster headache not intractable, peripheral edema, primary Parkinson's disease, B12 deficiency  are also affecting patient's functional outcome.   REHAB POTENTIAL: Good  CLINICAL DECISION MAKING: Evolving/moderate complexity  EVALUATION COMPLEXITY: Moderate  PLAN:  PT FREQUENCY: 2x/week  PT DURATION: 12 weeks  PLANNED INTERVENTIONS: Therapeutic exercises, Therapeutic activity, Neuromuscular re-education, Balance training, Gait training, Patient/Family education, Self Care, Joint mobilization, Stair training, Vestibular training, Canalith repositioning, Visual/preceptual remediation/compensation, Orthotic/Fit training, DME instructions, Electrical stimulation, Spinal mobilization, Cryotherapy, Moist heat, Splintting, Taping, Manual therapy, and Re-evaluation  PLAN FOR NEXT SESSION:   Continue to advance HEP. Rotation movements and improved step length. Various planes of movement initiation. Metronome (around 60 BPM) with walking,  particularly in doorways and turns.  Norman Herrlich, PT, DPT 01/20/2023, 11:23 AM

## 2023-01-22 ENCOUNTER — Ambulatory Visit: Payer: Medicare Other | Admitting: Physical Therapy

## 2023-01-23 ENCOUNTER — Ambulatory Visit: Payer: Medicare Other

## 2023-01-23 DIAGNOSIS — M6281 Muscle weakness (generalized): Secondary | ICD-10-CM | POA: Diagnosis not present

## 2023-01-23 DIAGNOSIS — R278 Other lack of coordination: Secondary | ICD-10-CM | POA: Diagnosis not present

## 2023-01-23 DIAGNOSIS — R262 Difficulty in walking, not elsewhere classified: Secondary | ICD-10-CM | POA: Diagnosis not present

## 2023-01-23 DIAGNOSIS — R471 Dysarthria and anarthria: Secondary | ICD-10-CM | POA: Diagnosis not present

## 2023-01-23 DIAGNOSIS — R2681 Unsteadiness on feet: Secondary | ICD-10-CM | POA: Diagnosis not present

## 2023-01-23 NOTE — Therapy (Addendum)
OUTPATIENT SPEECH LANGUAGE PATHOLOGY PARKINSON'S TREATMENT   Patient Name: Arthur Richardson MRN: [id number] DOB:[birth year], 72 y.o., male Today's Date: 01/23/2023  PCP: Olevia Perches, DO  REFERRING PROVIDER: Cristopher Peru, MD   End of Session - 01/23/23 1238     Visit Number 5    Number of Visits 25    Date for SLP Re-Evaluation 03/27/23    Authorization Type [employer]    Progress Note Due on Visit 10    SLP Start Time 1017    SLP Stop Time  1102    SLP Time Calculation (min) 45 min    Activity Tolerance Patient tolerated treatment well             Past Medical History:  Diagnosis Date   Hyperlipidemia    Hypertension    Past Surgical History:  Procedure Laterality Date   LITHOTRIPSY  09/1998 & 07/29/1999   Due to nephrolithiasis   PROSTATE SURGERY     Biopsy= Negative   TONSILLECTOMY     Patient Active Problem List   Diagnosis Date Noted   B12 deficiency 12/19/2022   Primary Parkinson's disease 07/01/2022   Senile purpura (HCC) 07/01/2022   Chronic cluster headache, not intractable 07/30/2021   Peripheral edema 07/30/2021   Dyspnea on exertion 06/08/2020   Trigger point of left shoulder region 08/21/2017   BPH with obstruction/lower urinary tract symptoms 02/20/2017   Advanced care planning/counseling discussion 02/20/2017   DDD (degenerative disc disease), lumbar 08/21/2016   Nephrolithiasis 02/01/2016   Barrett's esophagus with esophagitis 10/11/2014   Trigeminal neuralgia 10/11/2014   Hypertension 10/11/2014   Hyperlipidemia 10/11/2014   Hypothyroidism 10/11/2014   Dysphagia 08/04/2014    ONSET DATE: 12/26/22 referral date; pt reports being diagnosed with Parkinson's Disease in November 2023  REFERRING DIAG: Parkinson's Disease  THERAPY DIAG:  Dysarthria and anarthria  Rationale for Evaluation and Treatment Rehabilitation  SUBJECTIVE:   SUBJECTIVE STATEMENT: Pt alert, pleasant, and cooperative. Pt using RW. Festinating gait appreciated.  Endorsed feeling "tired" today.  Pt accompanied by: significant other; wife, [name], waited in waiting room    PERTINENT HISTORY: Pt 72 yo male referred to SLP for impairments due to Parkinson's Disease. PMHx significant for HTN, trigeminal neuralgia, barrett's esophagus with esophagitis, hypothyroidism, DDD, dysphagia (reports esophageal dilation in past), dyspnea on exertion, chronic cluster headache not intractable, peripheral edema, primary Parkinson's disease, B12 deficiency  DIAGNOSTIC FINDINGS: DaTSCAN, 02/2022, "Mark asymmetric decreased radiotracer activity in the RIGHT striatum is a finding typical of Parkinsonian syndrome pathology."  PAIN:  Are you having pain? No  FALLS: Has patient fallen in last 6 months?  Yes, Comment: reports falling in front of his house last week; pt sees OP PT  LIVING ENVIRONMENT: Lives with: lives with their family; spouse and adult son  Lives in: Mobile home  PLOF:  Level of assistance: Comment: modified independent Employment: Retired, Other: works as a Geneticist, molecular   PATIENT GOALS    "for people to hear me"  OBJECTIVE TODAY'S TREATMENT: Reviewed of changes to voice in Parkinson's Disease and speaking loud and with intent. Reviewed HEP. With use of Voice Analyst App to capture acoustic data and provide biofeedback, pt completed as follows: Warm Up: May-Me-My-Moe-Moo - x10 with min/mod verbal cues 64-86 dB Sustained "Ah": x10 with mod/max verbal cues; averaged 5s, 71-89dB, tendency to trail off and phonate on residual volume Ascending Pitch Glides: x10 with mod/max verbal cues; 130-168Hz , 75-85dB Descending Pitch Glides: x10 with mod/max verbal cues: 165-109Hz , 75-85dB  Oral reading of  functional phrases loudly and intentionally: Mod verbal cues, 70-85 dB  Conversational speech: ~60-80dB, mod verbal cues to improve breath support due to speaking on residual volume  Reduced carryover of compensations into informal exchanges noted. Additionally, pt  benefited from rest breaks between ex's in HEP due to complaints of SOB.      PATIENT EDUCATION: Education details: HEP, POC, changes to voice in Parkinson's Disease Person educated: Patient and Spouse Education method: Explanation and Handouts Education comprehension: verbalized understanding and needs further education   HOME EXERCISE PROGRAM: Yes - ex's as above and functional phrases for repetition   GOALS: Goals reviewed with patient? Yes  SHORT TERM GOALS: Target date: 10 sessions  Patient will demo HEP for dysarthria with min cues.  Baseline: Goal status: INITIAL  2.  Patient will maintain adequate vocal quality and intensity (>70dB) in sentence level responses 90% accuracy.  Baseline:  Goal status: INITIAL  3.  Patient will use dysarthria compensations in 2-3 sentence responses 90% accuracy with moderate cues.  Baseline:  Goal status: INITIAL  4.  Patient will complete PROM. Baseline:  Goal status: MET   LONG TERM GOALS: Target date: 12 weeks  Patient will demo HEP for dysarthria independently.  Baseline:  Goal status: INITIAL  2.  Patient will maintain intensity (avg >70dB) in 10 minutes conversation with modified independence  Baseline:  Goal status: INITIAL  3.  Patient will use dysarthria compensations (slow, loud, overpronounce, pause) for intelligibility >90% in 10 minutes conversation.  Baseline:  Goal status: INITIAL  4.  Patient will report improvement in PROM.  Baseline: VHI 73/120 (severe) Goal status: INITIAL    ASSESSMENT:  CLINICAL IMPRESSION: Patient is a 72  y.o. male who was seen today for speech therapy session in setting of Parkinson's Disease. Patient presents with at least a moderate hypokinetic dysarthria characterized by reduced vocal intensity, reduced breath support, reduced pitch variability, intermittent rapid rate, and waxing/waning vocal quality (hypophonic, hoarse, aphonic, near whisper). Pt with difficulty maintaining  adequate vocal intensity and quality and well as short rushes of speech which impact intelligibility. See details of tx above. Pt is a good candidate for intensive ST services; however, due to limited availability, we will utilize intensity-based approach 2x per week for 12 weeks. I recommend skilled ST to improve vocal quality, endurance, and intensity for improved intelligibility, and to meet other vocal demands of work, home, and socialization.     OBJECTIVE IMPAIRMENTS include dysarthria. These impairments are limiting patient from effectively communicating at home and in community. Factors affecting potential to achieve goals and functional outcome are medical prognosis. Patient will benefit from skilled SLP services to address above impairments and improve overall function.  REHAB POTENTIAL: Good  PLAN: SLP FREQUENCY: 2x/week  SLP DURATION: 12 weeks  PLANNED INTERVENTIONS: Cueing hierachy, Internal/external aids, Functional tasks, SLP instruction and feedback, Compensatory strategies, Patient/family education, and Re-evaluation   Clyde Canterbury, M.S., CCC-SLP Speech-Language Pathologist [place] [facility] [phone] Arnette Felts)   [facility] ([facility] [address] [place], [place], [address] Phone: [phone]   Fax:  [phone]

## 2023-01-27 ENCOUNTER — Ambulatory Visit: Payer: Medicare Other | Admitting: Physical Therapy

## 2023-01-30 ENCOUNTER — Ambulatory Visit: Payer: Medicare Other | Attending: Neurology

## 2023-01-30 DIAGNOSIS — R262 Difficulty in walking, not elsewhere classified: Secondary | ICD-10-CM | POA: Diagnosis not present

## 2023-01-30 DIAGNOSIS — M6281 Muscle weakness (generalized): Secondary | ICD-10-CM | POA: Diagnosis not present

## 2023-01-30 DIAGNOSIS — R471 Dysarthria and anarthria: Secondary | ICD-10-CM | POA: Diagnosis not present

## 2023-01-30 DIAGNOSIS — R2681 Unsteadiness on feet: Secondary | ICD-10-CM | POA: Insufficient documentation

## 2023-01-30 DIAGNOSIS — R278 Other lack of coordination: Secondary | ICD-10-CM | POA: Insufficient documentation

## 2023-01-30 NOTE — Therapy (Signed)
OUTPATIENT SPEECH LANGUAGE PATHOLOGY PARKINSON'S TREATMENT   Patient Name: Arthur Richardson MRN: [id number] DOB:1950/08/09, 72 y.o., male Today's Date: 01/30/2023  PCP: Olevia Perches, DO  REFERRING PROVIDER: Cristopher Peru, MD   End of Session - 01/30/23 0939     Visit Number 6    Number of Visits 25    Date for SLP Re-Evaluation 03/27/23    Authorization Type [employer] Medicare    Progress Note Due on Visit 10    SLP Start Time 0934    SLP Stop Time  1015    SLP Time Calculation (min) 41 min    Activity Tolerance Patient tolerated treatment well             Past Medical History:  Diagnosis Date   Hyperlipidemia    Hypertension    Past Surgical History:  Procedure Laterality Date   LITHOTRIPSY  09/1998 & 07/29/1999   Due to nephrolithiasis   PROSTATE SURGERY     Biopsy= Negative   TONSILLECTOMY     Patient Active Problem List   Diagnosis Date Noted   B12 deficiency 12/19/2022   Primary Parkinson's disease (HCC) 07/01/2022   Senile purpura (HCC) 07/01/2022   Chronic cluster headache, not intractable 07/30/2021   Peripheral edema 07/30/2021   Dyspnea on exertion 06/08/2020   Trigger point of left shoulder region 08/21/2017   BPH with obstruction/lower urinary tract symptoms 02/20/2017   Advanced care planning/counseling discussion 02/20/2017   DDD (degenerative disc disease), lumbar 08/21/2016   Nephrolithiasis 02/01/2016   Barrett's esophagus with esophagitis 10/11/2014   Trigeminal neuralgia 10/11/2014   Hypertension 10/11/2014   Hyperlipidemia 10/11/2014   Hypothyroidism 10/11/2014   Dysphagia 08/04/2014    ONSET DATE: 12/26/22 referral date; pt reports being diagnosed with Parkinson's Disease in November 2023  REFERRING DIAG: Parkinson's Disease  THERAPY DIAG:  Dysarthria and anarthria  Rationale for Evaluation and Treatment Rehabilitation  SUBJECTIVE:   SUBJECTIVE STATEMENT: Pt alert, pleasant, and cooperative. Pt using RW. Festinating gait  appreciated. Endorsed feeling "worn out today" Pt accompanied by: significant other; wife, [name], waited in waiting room    PERTINENT HISTORY: Pt 72 yo male referred to SLP for impairments due to Parkinson's Disease. PMHx significant for HTN, trigeminal neuralgia, barrett's esophagus with esophagitis, hypothyroidism, DDD, dysphagia (reports esophageal dilation in past), dyspnea on exertion, chronic cluster headache not intractable, peripheral edema, primary Parkinson's disease, B12 deficiency  DIAGNOSTIC FINDINGS: [employer], 02/2022, "Mark asymmetric decreased radiotracer activity in the RIGHT striatum is a finding typical of Parkinsonian syndrome pathology."  PAIN:  Are you having pain? Yes, B hips, 10/10; improved with pt initiated repositioning  FALLS: Has patient fallen in last 6 months?  Yes, Comment: reports falling in front of his house last week; pt sees OP PT  LIVING ENVIRONMENT: Lives with: lives with their family; spouse and adult son  Lives in: Mobile home  PLOF:  Level of assistance: Comment: modified independent Employment: Retired, Other: works as a Geneticist, molecular   PATIENT GOALS    "for people to hear me"  OBJECTIVE TODAY'S TREATMENT: Reviewed of changes to voice in Parkinson's Disease and speaking loud and with intent. Reviewed HEP. With use of Voice Analyst App to capture acoustic data and provide biofeedback, pt completed as follows: Warm Up: May-Me-My-Moe-Moo - x10 - min verbal cues - average 81dB  Sustained "Ah": x10 with mod/max verbal cues; averaged 5s, average dB tendency to trail off and phonate on residual volume Ascending Pitch Glides: x10 with mod/max verbal cues; 125-169 Hz, average 81  dB Descending Pitch Glides: x10 with mod/max verbal cues: 165-100 Hz, average 79 dB  Oral reading of functional phrases loudly and intentionally: Mod verbal cues, average 79 dB  Conversational speech: ~73 dB, mod verbal cues to improve breath support due to speaking on residual  volume  Reduced carryover of compensations into informal exchanges noted. Additionally, pt benefited from rest breaks between ex's in HEP due to complaints of SOB.  EMST75: Introduced RMST with use of EMST75 via video. Pt required max verbal cues to find max pressure and training pressure. Training pressure set to 5 cmH2O. Pt completed 5 breaths with ample rest breaks with initial cueing. HEP established for EMST use.      PATIENT EDUCATION: Education details: HEP, POC, changes to voice in Parkinson's Disease, progress to date, EMST, communication strategies Person educated: Patient and Spouse Education method: Explanation and Handouts; EMST video Education comprehension: verbalized understanding and needs further education   HOME EXERCISE PROGRAM: Yes - ex's as above and functional phrases for repetition   GOALS: Goals reviewed with patient? Yes  SHORT TERM GOALS: Target date: 10 sessions  Patient will demo HEP for dysarthria with min cues.  Baseline: Goal status: INITIAL  2.  Patient will maintain adequate vocal quality and intensity (>70dB) in sentence level responses 90% accuracy.  Baseline:  Goal status: INITIAL  3.  Patient will use dysarthria compensations in 2-3 sentence responses 90% accuracy with moderate cues.  Baseline:  Goal status: INITIAL  4.  Patient will complete PROM. Baseline:  Goal status: MET   LONG TERM GOALS: Target date: 12 weeks  Patient will demo HEP for dysarthria independently.  Baseline:  Goal status: INITIAL  2.  Patient will maintain intensity (avg >70dB) in 10 minutes conversation with modified independence  Baseline:  Goal status: INITIAL  3.  Patient will use dysarthria compensations (slow, loud, overpronounce, pause) for intelligibility >90% in 10 minutes conversation.  Baseline:  Goal status: INITIAL  4.  Patient will report improvement in PROM.  Baseline: VHI 73/120 (severe) Goal status:  INITIAL    ASSESSMENT:  CLINICAL IMPRESSION: Patient is a 72  y.o. male who was seen today for speech therapy session in setting of Parkinson's Disease. Patient presents with at least a moderate hypokinetic dysarthria characterized by reduced vocal intensity, reduced breath support, reduced pitch variability, intermittent rapid rate, and waxing/waning vocal quality (hypophonic, hoarse, aphonic, near whisper). Pt with difficulty maintaining adequate vocal intensity and quality and well as short rushes of speech which impact intelligibility. See details of tx above. Pt is a good candidate for intensive ST services; however, due to limited availability, we will utilize intensity-based approach 2x per week for 12 weeks. I recommend skilled ST to improve vocal quality, endurance, and intensity for improved intelligibility, and to meet other vocal demands of work, home, and socialization.     OBJECTIVE IMPAIRMENTS include dysarthria. These impairments are limiting patient from effectively communicating at home and in community. Factors affecting potential to achieve goals and functional outcome are medical prognosis. Patient will benefit from skilled SLP services to address above impairments and improve overall function.  REHAB POTENTIAL: Good  PLAN: SLP FREQUENCY: 2x/week  SLP DURATION: 12 weeks  PLANNED INTERVENTIONS: Cueing hierachy, Internal/external aids, Functional tasks, SLP instruction and feedback, Compensatory strategies, Patient/family education, and Re-evaluation   Clyde Canterbury, M.S., CCC-SLP Speech-Language Pathologist [place] [facility] [phone] Arnette Felts)   [place] [facility] at [facility] [address] [place],  [place], [address] Phone: [phone]   Fax:  [phone]

## 2023-02-04 ENCOUNTER — Ambulatory Visit: Payer: Medicare Other

## 2023-02-06 ENCOUNTER — Ambulatory Visit: Payer: Medicare Other

## 2023-02-06 DIAGNOSIS — R471 Dysarthria and anarthria: Secondary | ICD-10-CM | POA: Diagnosis not present

## 2023-02-06 DIAGNOSIS — R278 Other lack of coordination: Secondary | ICD-10-CM | POA: Diagnosis not present

## 2023-02-06 DIAGNOSIS — R2681 Unsteadiness on feet: Secondary | ICD-10-CM | POA: Diagnosis not present

## 2023-02-06 DIAGNOSIS — M6281 Muscle weakness (generalized): Secondary | ICD-10-CM | POA: Diagnosis not present

## 2023-02-06 DIAGNOSIS — R262 Difficulty in walking, not elsewhere classified: Secondary | ICD-10-CM | POA: Diagnosis not present

## 2023-02-06 NOTE — Therapy (Signed)
OUTPATIENT SPEECH LANGUAGE PATHOLOGY PARKINSON'S TREATMENT   Patient Name: Arthur Richardson MRN: [id number] DOB:[birth year], 72 y.o., male Today's Date: 02/06/2023  PCP: Arthur Perches, DO  REFERRING PROVIDER: Cristopher Peru, MD   End of Session - 02/06/23 1019     Visit Number 7    Number of Visits 25    Date for SLP Re-Evaluation 03/27/23    Authorization Type [employer] Medicare    Progress Note Due on Visit 10    SLP Start Time 1015    SLP Stop Time  1100    SLP Time Calculation (min) 45 min    Activity Tolerance Patient tolerated treatment well             Past Medical History:  Diagnosis Date   Hyperlipidemia    Hypertension    Past Surgical History:  Procedure Laterality Date   LITHOTRIPSY  09/1998 & 07/29/1999   Due to nephrolithiasis   PROSTATE SURGERY     Biopsy= Negative   TONSILLECTOMY     Patient Active Problem List   Diagnosis Date Noted   B12 deficiency 12/19/2022   Primary Parkinson's disease (HCC) 07/01/2022   Senile purpura (HCC) 07/01/2022   Chronic cluster headache, not intractable 07/30/2021   Peripheral edema 07/30/2021   Dyspnea on exertion 06/08/2020   Trigger point of left shoulder region 08/21/2017   BPH with obstruction/lower urinary tract symptoms 02/20/2017   Advanced care planning/counseling discussion 02/20/2017   DDD (degenerative disc disease), lumbar 08/21/2016   Nephrolithiasis 02/01/2016   Barrett's esophagus with esophagitis 10/11/2014   Trigeminal neuralgia 10/11/2014   Hypertension 10/11/2014   Hyperlipidemia 10/11/2014   Hypothyroidism 10/11/2014   Dysphagia 08/04/2014    ONSET DATE: 12/26/22 referral date; pt reports being diagnosed with Parkinson's Disease in November 2023  REFERRING DIAG: Parkinson's Disease  THERAPY DIAG:  Dysarthria and anarthria  Rationale for Evaluation and Treatment Rehabilitation  SUBJECTIVE:   SUBJECTIVE STATEMENT: Pt alert, pleasant, and cooperative. Pt using RW. Festinating gait  appreciated. Endorsed feeling "worn out today" Pt accompanied by: significant other; wife, Arthur Richardson, waited in waiting room    PERTINENT HISTORY: Pt 72 yo male referred to SLP for impairments due to Parkinson's Disease. PMHx significant for HTN, trigeminal neuralgia, barrett's esophagus with esophagitis, hypothyroidism, DDD, dysphagia (reports esophageal dilation in past), dyspnea on exertion, chronic cluster headache not intractable, peripheral edema, primary Parkinson's disease, B12 deficiency  DIAGNOSTIC FINDINGS: [employer], 02/2022, "Mark asymmetric decreased radiotracer activity in the RIGHT striatum is a finding typical of Parkinsonian syndrome pathology."  PAIN:  Are you having pain? Yes, B hips, 10/10; improved with pt initiated repositioning  FALLS: Has patient fallen in last 6 months?  Yes, Comment: reports falling in front of his house last week; pt sees OP PT  LIVING ENVIRONMENT: Lives with: lives with their family; spouse and adult son  Lives in: Mobile home  PLOF:  Level of assistance: Comment: modified independent Employment: Retired, Other: works as a Geneticist, molecular   PATIENT GOALS    "for people to hear me"  OBJECTIVE TODAY'S TREATMENT: Reviewed of changes to voice in Parkinson's Disease and speaking loud and with intent. Reviewed HEP. With use of Voice Analyst App to capture acoustic data and provide biofeedback, pt completed as follows: Warm Up: May-Me-My-Moe-Moo - x10 - min verbal cues - average 81dB  Sustained "Ah": x10 with mod/max verbal cues; averaged 7s, average 80dB tendency to trail off and phonate on residual volume; pt benefited from cues to voice on exhalation Ascending Pitch Glides: x10  with mod/max verbal cues; 125-178 Hz, average 82dB Descending Pitch Glides: x10 with mod/max verbal cues: 172-110 Hz, average 80dB  Oral reading of functional phrases loudly and intentionally: Mod verbal cues, average 79 dB  Conversational speech: ~73 dB, mod verbal cues to  improve breath support due to speaking on residual volume  Reduced carryover of compensations into informal exchanges noted. Additionally, pt benefited from rest breaks between ex's in HEP due to complaints of SOB.  [id number]: Pt reports completing EMST HEP daily. Established pt's new training threshold, 15cmH2O. Pt completed x5 sets of x5 breaths with rest breaks PRN. Minimal verbal/visual cueing needed to utilize hand placement to facilitate adequate lip seal.      PATIENT EDUCATION: Education details: as above Person educated: Patient and Spouse Education method: Chief Technology Officer;  Education comprehension: verbalized understanding and needs further education   HOME EXERCISE PROGRAM: Yes - ex's as above and functional phrases for repetition, EMST   GOALS: Goals reviewed with patient? Yes  SHORT TERM GOALS: Target date: 10 sessions  Patient will demo HEP for dysarthria with min cues.  Baseline: Goal status: INITIAL  2.  Patient will maintain adequate vocal quality and intensity (>70dB) in sentence level responses 90% accuracy.  Baseline:  Goal status: INITIAL  3.  Patient will use dysarthria compensations in 2-3 sentence responses 90% accuracy with moderate cues.  Baseline:  Goal status: INITIAL  4.  Patient will complete PROM. Baseline:  Goal status: MET   LONG TERM GOALS: Target date: 12 weeks  Patient will demo HEP for dysarthria independently.  Baseline:  Goal status: INITIAL  2.  Patient will maintain intensity (avg >70dB) in 10 minutes conversation with modified independence  Baseline:  Goal status: INITIAL  3.  Patient will use dysarthria compensations (slow, loud, overpronounce, pause) for intelligibility >90% in 10 minutes conversation.  Baseline:  Goal status: INITIAL  4.  Patient will report improvement in PROM.  Baseline: VHI 73/120 (severe) Goal status: INITIAL    ASSESSMENT:  CLINICAL IMPRESSION: Patient is a 72  y.o. male who was  seen today for speech therapy session in setting of Parkinson's Disease. Patient presents with at least a moderate hypokinetic dysarthria characterized by reduced vocal intensity, reduced breath support, reduced pitch variability, intermittent rapid rate, and waxing/waning vocal quality (hypophonic, hoarse, aphonic, near whisper). Pt with difficulty maintaining adequate vocal intensity and quality and well as short rushes of speech which impact intelligibility. See details of tx above. Pt is a good candidate for intensive ST services; however, due to limited availability, we will utilize intensity-based approach 2x per week for 12 weeks. I recommend skilled ST to improve vocal quality, endurance, and intensity for improved intelligibility, and to meet other vocal demands of work, home, and socialization.     OBJECTIVE IMPAIRMENTS include dysarthria. These impairments are limiting patient from effectively communicating at home and in community. Factors affecting potential to achieve goals and functional outcome are medical prognosis. Patient will benefit from skilled SLP services to address above impairments and improve overall function.  REHAB POTENTIAL: Good  PLAN: SLP FREQUENCY: 2x/week  SLP DURATION: 12 weeks  PLANNED INTERVENTIONS: Cueing hierachy, Internal/external aids, Functional tasks, SLP instruction and feedback, Compensatory strategies, Patient/family education, and Re-evaluation   Clyde Canterbury, M.S., CCC-SLP Speech-Language Pathologist [place] [facility] [phone] Arnette Felts)   [place] [facility] Out[facility] Rehabilitation at [facility] [address] [place], [place], [address] Phone: [phone]   Fax:  [phone]

## 2023-02-10 ENCOUNTER — Ambulatory Visit (INDEPENDENT_AMBULATORY_CARE_PROVIDER_SITE_OTHER): Payer: Medicare Other

## 2023-02-10 ENCOUNTER — Other Ambulatory Visit: Payer: Self-pay | Admitting: Family Medicine

## 2023-02-10 DIAGNOSIS — E538 Deficiency of other specified B group vitamins: Secondary | ICD-10-CM

## 2023-02-10 DIAGNOSIS — E039 Hypothyroidism, unspecified: Secondary | ICD-10-CM

## 2023-02-10 DIAGNOSIS — Z23 Encounter for immunization: Secondary | ICD-10-CM

## 2023-02-11 ENCOUNTER — Ambulatory Visit: Payer: Medicare Other | Admitting: Physical Therapy

## 2023-02-11 ENCOUNTER — Ambulatory Visit: Payer: Medicare Other

## 2023-02-11 DIAGNOSIS — M6281 Muscle weakness (generalized): Secondary | ICD-10-CM | POA: Diagnosis not present

## 2023-02-11 DIAGNOSIS — R2681 Unsteadiness on feet: Secondary | ICD-10-CM

## 2023-02-11 DIAGNOSIS — R471 Dysarthria and anarthria: Secondary | ICD-10-CM | POA: Diagnosis not present

## 2023-02-11 DIAGNOSIS — R262 Difficulty in walking, not elsewhere classified: Secondary | ICD-10-CM

## 2023-02-11 DIAGNOSIS — R278 Other lack of coordination: Secondary | ICD-10-CM | POA: Diagnosis not present

## 2023-02-11 NOTE — Telephone Encounter (Signed)
Requested Prescriptions  Pending Prescriptions Disp Refills   levothyroxine (SYNTHROID) 50 MCG tablet [Pharmacy Med Name: ALVOGEN-LEVOTHYROXINE TAB 50MCG] 90 tablet 0    Sig: TAKE 1 TABLET BY MOUTH DAILY  BEFORE BREAKFAST     Endocrinology:  Hypothyroid Agents Passed - 02/10/2023 11:05 AM      Passed - TSH in normal range and within 360 days    TSH  Date Value Ref Range Status  10/28/2022 0.951 0.450 - 4.500 uIU/mL Final         Passed - Valid encounter within last 12 months    Recent Outpatient Visits           3 months ago Hypothyroidism, unspecified type   [place] [facility], Megan P, DO   6 months ago Essential hypertension   [place] [facility], Megan P, DO   7 months ago Routine general medical examination at a health care facility   [facility], Megan P, DO   10 months ago BPH with obstruction/lower urinary tract symptoms   [facility], Megan P, DO   11 months ago Hypothyroidism, unspecified type    [facility], Oralia Rud, DO       Future Appointments             In 2 weeks Laural Benes, Oralia Rud, DO  [facility]   In 4 months Richardo Hanks, Laurette Schimke, MD [facility] [place]

## 2023-02-11 NOTE — Therapy (Signed)
OUTPATIENT PHYSICAL THERAPY NEURO TREATMENT   Patient Name: Arthur Richardson MRN: [id number] DOB:03/01/1951, 72 y.o., male Today's Date: 02/11/2023   PCP: Dorcas Carrow, DO  REFERRING PROVIDER: Lonell Face, MD   END OF SESSION:  PT End of Session - 02/11/23 1259     Visit Number 7    Number of Visits 25    Date for PT Re-Evaluation 03/20/23    Progress Note Due on Visit 10    PT Start Time 1315    PT Stop Time 1359    PT Time Calculation (min) 44 min    Equipment Utilized During Treatment Gait belt    Activity Tolerance Patient tolerated treatment well    Behavior During Therapy WFL for tasks assessed/performed                 Past Medical History:  Diagnosis Date   Hyperlipidemia    Hypertension    Past Surgical History:  Procedure Laterality Date   LITHOTRIPSY  09/1998 & 07/29/1999   Due to nephrolithiasis   PROSTATE SURGERY     Biopsy= Negative   TONSILLECTOMY     Patient Active Problem List   Diagnosis Date Noted   B12 deficiency 12/19/2022   Primary Parkinson's disease (HCC) 07/01/2022   Senile purpura (HCC) 07/01/2022   Chronic cluster headache, not intractable 07/30/2021   Peripheral edema 07/30/2021   Dyspnea on exertion 06/08/2020   Trigger point of left shoulder region 08/21/2017   BPH with obstruction/lower urinary tract symptoms 02/20/2017   Advanced care planning/counseling discussion 02/20/2017   DDD (degenerative disc disease), lumbar 08/21/2016   Nephrolithiasis 02/01/2016   Barrett's esophagus with esophagitis 10/11/2014   Trigeminal neuralgia 10/11/2014   Hypertension 10/11/2014   Hyperlipidemia 10/11/2014   Hypothyroidism 10/11/2014   Dysphagia 08/04/2014    ONSET DATE: November 2023  REFERRING DIAG: 20.A1 (ICD-10-CM) - Parkinson's disease   THERAPY DIAG:  Difficulty in walking, not elsewhere classified  Muscle weakness (generalized)  Unsteadiness on feet  Rationale for Evaluation and Treatment:  Rehabilitation  SUBJECTIVE:                                                                                                                                                                                             SUBJECTIVE STATEMENT:   Pt reports doing well today. Pt denies any recent falls/stumbles since prior session. Pt denies any updates to medications or medical appointment since prior session. Pt reports good compliance with HEP when time permits.    Pt accompanied by: self  PERTINENT HISTORY:   The pt is a pleasant 72 yo male referred  to PT for impairments due to Parkinson's. He ambulates to session using RW.  Pt's primary concerns include decreased strength and poor standing endurance. Pt also reports if he sits for too long he can have pain in BLE and hips and his legs can "lock up." Pt also with decreased endurance with ambulation, poor balance, and decreased UE/grip strength. Pt using RW today but uses a cane at home for shorter distances. Pt has difficulty with ADLs due to PD symptoms and his wife typically helps him. He has trouble with bed mobility, specifically rolling over, and difficulty with transfers. Pt avoids stairs. Pt reports freezing episodes. He says he was told to stop PD medications last neurology visit due to severe vertigo. He has follow-up with Dr. Sherryll Burger in December to try new PD medication. Pt's wife helps pt with ADLs. He is a taxidermist. He used to be active prior to onset of PD symptoms and would run a mile/day. Pt reports PT years ago for back pain. PMH significant for HTN, trigeminal neuralgia, barrett's esophagus with esophagitis, hypothyroidism, DDD, dysphagia, dyspnea on exertion, chronic cluster headache not intractable, peripheral edema, primary Parkinson's disease, B12 deficiency  PAIN:  Are you having pain? Yes: NPRS scale: not rated/10 Pain location: hips, legs  Pain description:   Aggravating factors: sitting too long Relieving factors: standing  up briefly  PRECAUTIONS: Fall  RED FLAGS: Bowel or bladder incontinence: Yes: bladder   Hx of CA: negative Hx of compression fx: negative AAA: negative   WEIGHT BEARING RESTRICTIONS: No  FALLS: Has patient fallen in last 6 months? No, not in the last six months but 2 falls a year ago states "when my leg gets to jumping."   LIVING ENVIRONMENT: Lives with: lives with their spouse and son Lives in: Other double wide Stairs: Yes: External: 5 steps; bilateral hand-rails Has following equipment at home: Single point cane and Walker - 2 wheeled  PLOF: Independent  PATIENT GOALS: Improve strength and decrease pain  OBJECTIVE:   DIAGNOSTIC FINDINGS:    Via chart  03/05/2022 NM BRAIN "FINDINGS: There is significant loss of activity within the entire RIGHT striatum. Relative maintained activity in the head of the LEFT caudate nucleus. Loss of activity in the LEFT putamen.   IMPRESSION: Mark asymmetric decreased radiotracer activity in the RIGHT striatum is a finding typical of Parkinsonian syndrome pathology.   Of note, DaTSCAN is not diagnostic of Parkinsonian syndromes, which remains a clinical diagnosis. DaTscan is an adjuvant test to aid in the clinical diagnosis of Parkinsonian syndromes.     Electronically Signed   By: Genevive Bi M.D.   On: 03/05/2022 14:41"  COGNITION: Overall cognitive status: Within functional limits for tasks assessed   SENSATION: WFL to light touch with testing UE/LE  Pt reports some tingling felt in his low back  COORDINATION: Tremor in bilat UE Some difficulty with rapid alt UE movement WFL chin<>target (pen) WFL rapid alt movement LE  EDEMA:  Pt reports no swelling  MUSCLE TONE: tremor bilat UE, observed rigidity with movement   POSTURE: rounded shoulders, forward head, and increased thoracic kyphosis   LOWER EXTREMITY MMT:    Grossly 4/5 bilat LE, greatest deficits in hip mm, and hip flexors are pain-limited.    BED  MOBILITY:  Imparied per pt report. Has difficulty rolling and getting out of his bed   TRANSFERS: Assistive device utilized: Environmental consultant - 2 wheeled  Sit to stand: Modified independence Stand to sit: Modified independence Chair to chair: SBA  STAIRS: Reports he avoids stairs due to difficulty   GAIT: Gait pattern:  festinating gait, crouched posture Distance walked: , clinic distances Assistive device utilized: Walker - 2 wheeled Level of assistance: CGA Comments: with large-amplitude cuing for step-length pt able to improve during visit, significant difficulty with hypokinetic movement with turning, freezing   FUNCTIONAL TESTS:  5 times sit to stand: 31 sec with use of UE  : 0.67 m/s with RW  BERG 9/3: 26  TUG 9/3: 35.45sec  PATIENT SURVEYS:  FOTO 12 (goal 36)  TODAY'S TREATMENT:                                                                                                                              DATE: 02/11/23   TA  U step walking with metronome and laser for cues x 150 ft - cues for focus on metronome, challenged with turns and doorways   Standing side step for lateral movement initiation 2 x 10 ea side, cues for amplitude of movement. Last round cues for "stomping" to increase movement amplitude x 10 ea    NMR Seated PWR moves x 10 ea  - UP, rock, twist, and step   -modified PWR! Twist to prevent L shoulder pain ( turned into just trunk rotation)  -with PWR! Step used metronome at 50 BPM   Standing PWR! Rock with UE assist  Activity Description: turning then step taps  Activity Setting:  The Blaze Pod Random setting was chosen to enhance cognitive processing and agility, providing an unpredictable environment to simulate real-world scenarios, and fostering quick reactions and adaptability.   Number of Pods:  4 Cycles/Sets:  2 Duration (Time or Hit Count):  20 hits  Activity Description: random tapping for forward and lateral stepping   Activity Setting:  The Blaze Pod Random setting was chosen to enhance cognitive processing and agility, providing an unpredictable environment to simulate real-world scenarios, and fostering quick reactions and adaptability.   Number of Pods:  4 Cycles/Sets:  2 Duration (Time or Hit Count):  20 hits  TE Bolster adduction 20 x 3 sec holds seated  Pt required occasional therapeutic rest breaks due fatigue, PT was quick to ask when pt appeared to be fatiguing in order to prevent excessive fatigue.   PATIENT EDUCATION: Education details: recommendation for speech therapy (pt agreeable), exam findings, goals, plan Person educated: Patient Education method: Explanation, Demonstration, and Verbal cues Education comprehension: verbalized understanding, returned demonstration, verbal cues required, and needs further education  HOME EXERCISE PROGRAM:     SHORT TERM GOALS: Target date: 02/06/2023   Patient will be independent in home exercise program to improve strength/mobility for better functional independence with ADLs. Baseline: to be initiated Goal status: INITIAL   LONG TERM GOALS: Target date: 03/20/2023    Patient will increase FOTO score to equal to or greater than  36   to demonstrate statistically significant improvement in mobility and quality of life.  Baseline:  12 Goal status: INITIAL  2.  Patient (> 4 years old) will complete five times sit to stand test in < 15 seconds indicating an increased LE strength and improved balance. Baseline: 31 sec use of BUE to push off chair Goal status: INITIAL  3.  Patient will increase Berg Balance score by > 6 points to demonstrate decreased fall risk during functional activities Baseline: 26 Goal status: INITIAL  4.  Patient will increase 10 meter walk test to >1.30m/s as to improve gait speed for better community ambulation and to reduce fall risk. Baseline: 0.67 m/s with RW Goal status: INITIAL  5.  Patient will reduce  timed up and go to <11 seconds to reduce fall risk and demonstrate improved transfer/gait ability. Baseline: 35.45sec  Goal status: INITIAL    ASSESSMENT:  CLINICAL IMPRESSION:   Patient arrived with good motivation form completion of pt activities.   Pt progresses with step and turn initiation with good improvement with practice and with cues. Pt did particularly with blaze pods for movement initiation. Pt will continue to benefit from skilled physical therapy intervention to address impairments, improve QOL, and attain therapy goals.    OBJECTIVE IMPAIRMENTS: Abnormal gait, decreased activity tolerance, decreased balance, decreased coordination, decreased endurance, decreased mobility, difficulty walking, decreased ROM, decreased strength, impaired flexibility, impaired tone, impaired UE functional use, improper body mechanics, postural dysfunction, and pain.   ACTIVITY LIMITATIONS: carrying, lifting, bending, sitting, standing, squatting, stairs, transfers, bed mobility, continence, bathing, toileting, dressing, hygiene/grooming, and locomotion level  PARTICIPATION LIMITATIONS: meal prep, cleaning, laundry, driving, shopping, community activity, occupation, and yard work  PERSONAL FACTORS: Age and 3+ comorbidities: PMH significant for HTN, trigeminal neuralgia, barrett's esophagus with esophagitis, hypothyroidism, DDD, dysphagia, dyspnea on exertion, chronic cluster headache not intractable, peripheral edema, primary Parkinson's disease, B12 deficiency  are also affecting patient's functional outcome.   REHAB POTENTIAL: Good  CLINICAL DECISION MAKING: Evolving/moderate complexity  EVALUATION COMPLEXITY: Moderate  PLAN:  PT FREQUENCY: 2x/week  PT DURATION: 12 weeks  PLANNED INTERVENTIONS: Therapeutic exercises, Therapeutic activity, Neuromuscular re-education, Balance training, Gait training, Patient/Family education, Self Care, Joint mobilization, Stair training, Vestibular  training, Canalith repositioning, Visual/preceptual remediation/compensation, Orthotic/Fit training, DME instructions, Electrical stimulation, Spinal mobilization, Cryotherapy, Moist heat, Splintting, Taping, Manual therapy, and Re-evaluation  PLAN FOR NEXT SESSION:   Continue to advance HEP. Rotation movements and improved step length. Various planes of movement initiation. Metronome (around 60 BPM) with walking, particularly in doorways and turns.  Norman Herrlich, PT, DPT 02/11/2023, 1:01 PM

## 2023-02-11 NOTE — Therapy (Signed)
OUTPATIENT SPEECH LANGUAGE PATHOLOGY PARKINSON'S TREATMENT   Patient Name: Arthur Richardson MRN: [id number] DOB:1951-03-01, 72 y.o., male Today's Date: 02/11/2023  PCP: Olevia Perches, DO  REFERRING PROVIDER: Cristopher Peru, MD   End of Session - 02/11/23 1104     Visit Number 8    Number of Visits 25    Date for SLP Re-Evaluation 03/27/23    Authorization Type [employer]    Progress Note Due on Visit 10    SLP Start Time 1100    SLP Stop Time  1145    SLP Time Calculation (min) 45 min             Past Medical History:  Diagnosis Date   Hyperlipidemia    Hypertension    Past Surgical History:  Procedure Laterality Date   LITHOTRIPSY  09/1998 & 07/29/1999   Due to nephrolithiasis   PROSTATE SURGERY     Biopsy= Negative   TONSILLECTOMY     Patient Active Problem List   Diagnosis Date Noted   B12 deficiency 12/19/2022   Primary Parkinson's disease (HCC) 07/01/2022   Senile purpura (HCC) 07/01/2022   Chronic cluster headache, not intractable 07/30/2021   Peripheral edema 07/30/2021   Dyspnea on exertion 06/08/2020   Trigger point of left shoulder region 08/21/2017   BPH with obstruction/lower urinary tract symptoms 02/20/2017   Advanced care planning/counseling discussion 02/20/2017   DDD (degenerative disc disease), lumbar 08/21/2016   Nephrolithiasis 02/01/2016   Barrett's esophagus with esophagitis 10/11/2014   Trigeminal neuralgia 10/11/2014   Hypertension 10/11/2014   Hyperlipidemia 10/11/2014   Hypothyroidism 10/11/2014   Dysphagia 08/04/2014    ONSET DATE: 12/26/22 referral date; pt reports being diagnosed with Parkinson's Disease in November 2023  REFERRING DIAG: Parkinson's Disease  THERAPY DIAG:  Dysarthria and anarthria  Rationale for Evaluation and Treatment Rehabilitation  SUBJECTIVE:   SUBJECTIVE STATEMENT: Pt alert, pleasant, and cooperative. Pt using RW. Festinating gait appreciated. Endorsed feeling "tired and achy." Pt  accompanied by: significant other; wife, [name], waited in waiting room    PERTINENT HISTORY: Pt 72 yo male referred to SLP for impairments due to Parkinson's Disease. PMHx significant for HTN, trigeminal neuralgia, barrett's esophagus with esophagitis, hypothyroidism, DDD, dysphagia (reports esophageal dilation in past), dyspnea on exertion, chronic cluster headache not intractable, peripheral edema, primary Parkinson's disease, B12 deficiency  DIAGNOSTIC FINDINGS: DaTSCAN, 02/2022, "Mark asymmetric decreased radiotracer activity in the RIGHT striatum is a finding typical of Parkinsonian syndrome pathology."  PAIN:  Are you having pain? Yes, B hips, 10/10; improved with pt initiated repositioning  FALLS: Has patient fallen in last 6 months?  Yes, Comment: reports falling in front of his house last week; pt sees OP PT  LIVING ENVIRONMENT: Lives with: lives with their family; spouse and adult son  Lives in: Mobile home  PLOF:  Level of assistance: Comment: modified independent Employment: Retired, Other: works as a Geneticist, molecular   PATIENT GOALS    "for people to hear me"  OBJECTIVE TODAY'S TREATMENT: Reviewed of changes to voice in Parkinson's Disease and speaking loud and with intent. Reviewed HEP. With use of Voice Analyst App to capture acoustic data and provide biofeedback, pt completed as follows: Warm Up: May-Me-My-Moe-Moo - x10 - min verbal cues - average 84 dB  Sustained "Ah": x10 with mod/max verbal cues; averaged 7s, average 80dB tendency to trail off and phonate on residual volume; pt benefited from cues to voice on exhalation Ascending Pitch Glides: x10 with mod/max verbal cues; 123-173 Hz, average 82dB  Descending Pitch Glides: x10 with mod/max verbal cues: 172-110 Hz, average 80dB  Oral reading of functional phrases loudly and intentionally: Mod verbal cues, average 82 dB  Conversational speech: ~73 dB, mod verbal cues to improve breath support due to speaking on residual  volume  Reduced carryover of compensations into informal exchanges noted. Additionally, pt benefited from rest breaks between ex's in HEP due to complaints of SOB.  [id number]: Pt reports completing EMST HEP daily. Pt completed x5 sets of x5 breaths @ 15cmH2O with rest breaks PRN. Occasional verbal/visual cueing needed to utilize hand placement to facilitate adequate lip seal.      PATIENT EDUCATION: Education details: as above Person educated: Patient and Spouse Education method: Chief Technology Officer;  Education comprehension: verbalized understanding and needs further education   HOME EXERCISE PROGRAM: Yes - ex's as above and functional phrases for repetition, EMST   GOALS: Goals reviewed with patient? Yes  SHORT TERM GOALS: Target date: 10 sessions  Patient will demo HEP for dysarthria with min cues.  Baseline: Goal status: INITIAL  2.  Patient will maintain adequate vocal quality and intensity (>70dB) in sentence level responses 90% accuracy.  Baseline:  Goal status: INITIAL  3.  Patient will use dysarthria compensations in 2-3 sentence responses 90% accuracy with moderate cues.  Baseline:  Goal status: INITIAL  4.  Patient will complete PROM. Baseline:  Goal status: MET   LONG TERM GOALS: Target date: 12 weeks  Patient will demo HEP for dysarthria independently.  Baseline:  Goal status: INITIAL  2.  Patient will maintain intensity (avg >70dB) in 10 minutes conversation with modified independence  Baseline:  Goal status: INITIAL  3.  Patient will use dysarthria compensations (slow, loud, overpronounce, pause) for intelligibility >90% in 10 minutes conversation.  Baseline:  Goal status: INITIAL  4.  Patient will report improvement in PROM.  Baseline: VHI 73/120 (severe) Goal status: INITIAL    ASSESSMENT:  CLINICAL IMPRESSION: Patient is a 72  y.o. male who was seen today for speech therapy session in setting of Parkinson's Disease. Patient presents  with at least a moderate hypokinetic dysarthria characterized by reduced vocal intensity, reduced breath support, reduced pitch variability, intermittent rapid rate, and waxing/waning vocal quality (hypophonic, hoarse, aphonic, near whisper). Pt with difficulty maintaining adequate vocal intensity and quality and well as short rushes of speech which impact intelligibility. See details of tx above. Pt is a good candidate for intensive ST services; however, due to limited availability, we will utilize intensity-based approach 2x per week for 12 weeks. I recommend skilled ST to improve vocal quality, endurance, and intensity for improved intelligibility, and to meet other vocal demands of work, home, and socialization.     OBJECTIVE IMPAIRMENTS include dysarthria. These impairments are limiting patient from effectively communicating at home and in community. Factors affecting potential to achieve goals and functional outcome are medical prognosis. Patient will benefit from skilled SLP services to address above impairments and improve overall function.  REHAB POTENTIAL: Good  PLAN: SLP FREQUENCY: 2x/week  SLP DURATION: 12 weeks  PLANNED INTERVENTIONS: Cueing hierachy, Internal/external aids, Functional tasks, SLP instruction and feedback, Compensatory strategies, Patient/family education, and Re-evaluation   Clyde Canterbury, M.S., CCC-SLP Speech-Language Pathologist [facility] [phone] Arnette Felts)    [facility] [address] [place], [place], [address] Phone: [phone]   Fax:  [phone]

## 2023-02-13 ENCOUNTER — Ambulatory Visit: Payer: Medicare Other

## 2023-02-13 ENCOUNTER — Ambulatory Visit: Payer: Medicare Other | Admitting: Physical Therapy

## 2023-02-13 DIAGNOSIS — M6281 Muscle weakness (generalized): Secondary | ICD-10-CM | POA: Diagnosis not present

## 2023-02-13 DIAGNOSIS — R262 Difficulty in walking, not elsewhere classified: Secondary | ICD-10-CM | POA: Diagnosis not present

## 2023-02-13 DIAGNOSIS — R471 Dysarthria and anarthria: Secondary | ICD-10-CM | POA: Diagnosis not present

## 2023-02-13 DIAGNOSIS — R2681 Unsteadiness on feet: Secondary | ICD-10-CM | POA: Diagnosis not present

## 2023-02-13 DIAGNOSIS — R278 Other lack of coordination: Secondary | ICD-10-CM | POA: Diagnosis not present

## 2023-02-13 NOTE — Therapy (Signed)
OUTPATIENT SPEECH LANGUAGE PATHOLOGY PARKINSON'S TREATMENT   Patient Name: Arthur Richardson MRN: [id number] DOB:[birth year], 72 y.o., male Today's Date: 02/13/2023  PCP: Olevia Perches, DO  REFERRING PROVIDER: Cristopher Peru, MD   End of Session - 02/13/23 0926     Visit Number 9    Number of Visits 25    Date for SLP Re-Evaluation 03/27/23    Authorization Type [employer]    Progress Note Due on Visit 10    SLP Start Time 0930    SLP Stop Time  1015    SLP Time Calculation (min) 45 min             Past Medical History:  Diagnosis Date   Hyperlipidemia    Hypertension    Past Surgical History:  Procedure Laterality Date   LITHOTRIPSY  09/1998 & 07/29/1999   Due to nephrolithiasis   PROSTATE SURGERY     Biopsy= Negative   TONSILLECTOMY     Patient Active Problem List   Diagnosis Date Noted   B12 deficiency 12/19/2022   Primary Parkinson's disease (HCC) 07/01/2022   Senile purpura (HCC) 07/01/2022   Chronic cluster headache, not intractable 07/30/2021   Peripheral edema 07/30/2021   Dyspnea on exertion 06/08/2020   Trigger point of left shoulder region 08/21/2017   BPH with obstruction/lower urinary tract symptoms 02/20/2017   Advanced care planning/counseling discussion 02/20/2017   DDD (degenerative disc disease), lumbar 08/21/2016   Nephrolithiasis 02/01/2016   Barrett's esophagus with esophagitis 10/11/2014   Trigeminal neuralgia 10/11/2014   Hypertension 10/11/2014   Hyperlipidemia 10/11/2014   Hypothyroidism 10/11/2014   Dysphagia 08/04/2014    ONSET DATE: 12/26/22 referral date; pt reports being diagnosed with Parkinson's Disease in November 2023  REFERRING DIAG: Parkinson's Disease  THERAPY DIAG:  Dysarthria and anarthria  Rationale for Evaluation and Treatment Rehabilitation  SUBJECTIVE:   SUBJECTIVE STATEMENT: Pt alert, pleasant, and cooperative. Pt using RW. Festinating gait appreciated. Endorsed feeling "tired and achy." Pt  accompanied by: significant other; wife, [name], waited in waiting room    PERTINENT HISTORY: Pt 72 yo male referred to SLP for impairments due to Parkinson's Disease. PMHx significant for HTN, trigeminal neuralgia, barrett's esophagus with esophagitis, hypothyroidism, DDD, dysphagia (reports esophageal dilation in past), dyspnea on exertion, chronic cluster headache not intractable, peripheral edema, primary Parkinson's disease, B12 deficiency  DIAGNOSTIC FINDINGS: DaTSCAN, 02/2022, "Mark asymmetric decreased radiotracer activity in the RIGHT striatum is a finding typical of Parkinsonian syndrome pathology."  PAIN:  Are you having pain? Yes, B hips, 10/10; improved with pt initiated repositioning  FALLS: Has patient fallen in last 6 months?  Yes, Comment: reports falling in front of his house last week; pt sees OP PT  LIVING ENVIRONMENT: Lives with: lives with their family; spouse and adult son  Lives in: Mobile home  PLOF:  Level of assistance: Comment: modified independent Employment: Retired, Other: works as a Geneticist, molecular   PATIENT GOALS    "for people to hear me"  OBJECTIVE TODAY'S TREATMENT: Reviewed of changes to voice in Parkinson's Disease and speaking loud and with intent. Reviewed HEP. With use of Voice Analyst App to capture acoustic data and provide biofeedback, pt completed as follows: Warm Up: May-Me-My-Moe-Moo - x10 - min verbal cues - average 85 dB  Sustained "Ah": x10 with mod/max verbal cues; averaged 5s, average 80dB tendency to trail off and phonate on residual volume; pt benefited from cues to voice on exhalation Ascending Pitch Glides: x10 with mod/max verbal cues; 133-177 Hz, average 81dB  Descending Pitch Glides: x10 with mod/max verbal cues: 172-106 Hz, average 80dB  Oral reading of functional phrases loudly and intentionally: Mod verbal cues, average 82 dB  Conversational speech: ~73 dB, mod verbal cues to improve breath support due to speaking on residual  volume  Reduced carryover of compensations into informal exchanges noted. Additionally, pt benefited from rest breaks between ex's in HEP due to complaints of SOB.  [id number]: Pt reports completing EMST HEP daily. Pt completed x5 sets of x5 breaths @ 15cmH2O with rest breaks PRN. Attempted new training threshold 35cmH2O; however, pt unable to complete.  Occasional verbal/visual cueing needed to utilize hand placement to facilitate adequate lip seal.      PATIENT EDUCATION: Education details: as above Person educated: Patient and Spouse Education method: Chief Technology Officer;  Education comprehension: verbalized understanding and needs further education   HOME EXERCISE PROGRAM: Yes - ex's as above and functional phrases for repetition, EMST   GOALS: Goals reviewed with patient? Yes  SHORT TERM GOALS: Target date: 10 sessions  Patient will demo HEP for dysarthria with min cues.  Baseline: Goal status: INITIAL  2.  Patient will maintain adequate vocal quality and intensity (>70dB) in sentence level responses 90% accuracy.  Baseline:  Goal status: INITIAL  3.  Patient will use dysarthria compensations in 2-3 sentence responses 90% accuracy with moderate cues.  Baseline:  Goal status: INITIAL  4.  Patient will complete PROM. Baseline:  Goal status: MET   LONG TERM GOALS: Target date: 12 weeks  Patient will demo HEP for dysarthria independently.  Baseline:  Goal status: INITIAL  2.  Patient will maintain intensity (avg >70dB) in 10 minutes conversation with modified independence  Baseline:  Goal status: INITIAL  3.  Patient will use dysarthria compensations (slow, loud, overpronounce, pause) for intelligibility >90% in 10 minutes conversation.  Baseline:  Goal status: INITIAL  4.  Patient will report improvement in PROM.  Baseline: VHI 73/120 (severe) Goal status: INITIAL    ASSESSMENT:  CLINICAL IMPRESSION: Patient is a 72  y.o. male who was seen today for  speech therapy session in setting of Parkinson's Disease. Patient presents with at least a moderate hypokinetic dysarthria characterized by reduced vocal intensity, reduced breath support, reduced pitch variability, intermittent rapid rate, and waxing/waning vocal quality (hypophonic, hoarse, aphonic, near whisper). Pt with difficulty maintaining adequate vocal intensity and quality and well as short rushes of speech which impact intelligibility. See details of tx above. Pt is a good candidate for intensive ST services; however, due to limited availability, we will utilize intensity-based approach 2x per week for 12 weeks. I recommend skilled ST to improve vocal quality, endurance, and intensity for improved intelligibility, and to meet other vocal demands of work, home, and socialization.     OBJECTIVE IMPAIRMENTS include dysarthria. These impairments are limiting patient from effectively communicating at home and in community. Factors affecting potential to achieve goals and functional outcome are medical prognosis. Patient will benefit from skilled SLP services to address above impairments and improve overall function.  REHAB POTENTIAL: Good  PLAN: SLP FREQUENCY: 2x/week  SLP DURATION: 12 weeks  PLANNED INTERVENTIONS: Cueing hierachy, Internal/external aids, Functional tasks, SLP instruction and feedback, Compensatory strategies, Patient/family education, and Re-evaluation   Clyde Canterbury, M.S., CCC-SLP Speech-Language Pathologist [facility] [phone] Arnette Felts)   [facility] [address] [place], [place], [address] Phone: [phone]   Fax:  [phone]

## 2023-02-13 NOTE — Therapy (Signed)
OUTPATIENT PHYSICAL THERAPY NEURO TREATMENT   Patient Name: Arthur Richardson MRN: [id number] DOB:1950-04-30, 72 y.o., male Today's Date: 02/13/2023   PCP: Dorcas Carrow, DO  REFERRING PROVIDER: Lonell Face, MD   END OF SESSION:  PT End of Session - 02/13/23 0920     Visit Number 8    Number of Visits 25    Date for PT Re-Evaluation 03/20/23    Progress Note Due on Visit 10    PT Start Time 0847    PT Stop Time 0929    PT Time Calculation (min) 42 min    Equipment Utilized During Treatment Gait belt    Activity Tolerance Patient tolerated treatment well    Behavior During Therapy [facility] for tasks assessed/performed                 Past Medical History:  Diagnosis Date   Hyperlipidemia    Hypertension    Past Surgical History:  Procedure Laterality Date   LITHOTRIPSY  09/1998 & 07/29/1999   Due to nephrolithiasis   PROSTATE SURGERY     Biopsy= Negative   TONSILLECTOMY     Patient Active Problem List   Diagnosis Date Noted   B12 deficiency 12/19/2022   Primary Parkinson's disease (HCC) 07/01/2022   Senile purpura (HCC) 07/01/2022   Chronic cluster headache, not intractable 07/30/2021   Peripheral edema 07/30/2021   Dyspnea on exertion 06/08/2020   Trigger point of left shoulder region 08/21/2017   BPH with obstruction/lower urinary tract symptoms 02/20/2017   Advanced care planning/counseling discussion 02/20/2017   DDD (degenerative disc disease), lumbar 08/21/2016   Nephrolithiasis 02/01/2016   Barrett's esophagus with esophagitis 10/11/2014   Trigeminal neuralgia 10/11/2014   Hypertension 10/11/2014   Hyperlipidemia 10/11/2014   Hypothyroidism 10/11/2014   Dysphagia 08/04/2014    ONSET DATE: November 2023  REFERRING DIAG: 20.A1 (ICD-10-CM) - Parkinson's disease   THERAPY DIAG:  Difficulty in walking, not elsewhere classified  Muscle weakness (generalized)  Unsteadiness on feet  Rationale for Evaluation and Treatment:  Rehabilitation  SUBJECTIVE:                                                                                                                                                                                             SUBJECTIVE STATEMENT:   Pt reports doing well today. Pt denies any recent falls/stumbles since prior session. Pt denies any updates to medications or medical appointment since prior session. Pt reports good compliance with HEP when time permits.    Pt accompanied by: self  PERTINENT HISTORY:   The pt is a pleasant 72 yo male referred  to PT for impairments due to Parkinson's. He ambulates to session using RW.  Pt's primary concerns include decreased strength and poor standing endurance. Pt also reports if he sits for too long he can have pain in BLE and hips and his legs can "lock up." Pt also with decreased endurance with ambulation, poor balance, and decreased UE/grip strength. Pt using RW today but uses a cane at home for shorter distances. Pt has difficulty with ADLs due to PD symptoms and his wife typically helps him. He has trouble with bed mobility, specifically rolling over, and difficulty with transfers. Pt avoids stairs. Pt reports freezing episodes. He says he was told to stop PD medications last neurology visit due to severe vertigo. He has follow-up with Dr. Sherryll Burger in December to try new PD medication. Pt's wife helps pt with ADLs. He is a taxidermist. He used to be active prior to onset of PD symptoms and would run a mile/day. Pt reports PT years ago for back pain. PMH significant for HTN, trigeminal neuralgia, barrett's esophagus with esophagitis, hypothyroidism, DDD, dysphagia, dyspnea on exertion, chronic cluster headache not intractable, peripheral edema, primary Parkinson's disease, B12 deficiency  PAIN:  Are you having pain? Yes: NPRS scale: not rated/10 Pain location: hips, legs  Pain description:   Aggravating factors: sitting too long Relieving factors: standing  up briefly  PRECAUTIONS: Fall  RED FLAGS: Bowel or bladder incontinence: Yes: bladder   Hx of CA: negative Hx of compression fx: negative AAA: negative   WEIGHT BEARING RESTRICTIONS: No  FALLS: Has patient fallen in last 6 months? No, not in the last six months but 2 falls a year ago states "when my leg gets to jumping."   LIVING ENVIRONMENT: Lives with: lives with their spouse and son Lives in: Other double wide Stairs: Yes: External: 5 steps; bilateral hand-rails Has following equipment at home: Single point cane and Walker - 2 wheeled  PLOF: Independent  PATIENT GOALS: Improve strength and decrease pain  OBJECTIVE:   DIAGNOSTIC FINDINGS:    Via chart  03/05/2022 NM BRAIN "FINDINGS: There is significant loss of activity within the entire RIGHT striatum. Relative maintained activity in the head of the LEFT caudate nucleus. Loss of activity in the LEFT putamen.   IMPRESSION: Mark asymmetric decreased radiotracer activity in the RIGHT striatum is a finding typical of Parkinsonian syndrome pathology.   Of note, DaTSCAN is not diagnostic of Parkinsonian syndromes, which remains a clinical diagnosis. DaTscan is an adjuvant test to aid in the clinical diagnosis of Parkinsonian syndromes.     Electronically Signed   By: Genevive Bi M.D.   On: 03/05/2022 14:41"  COGNITION: Overall cognitive status: Within functional limits for tasks assessed   SENSATION: WFL to light touch with testing UE/LE  Pt reports some tingling felt in his low back  COORDINATION: Tremor in bilat UE Some difficulty with rapid alt UE movement WFL chin<>target (pen) WFL rapid alt movement LE  EDEMA:  Pt reports no swelling  MUSCLE TONE: tremor bilat UE, observed rigidity with movement   POSTURE: rounded shoulders, forward head, and increased thoracic kyphosis   LOWER EXTREMITY MMT:    Grossly 4/5 bilat LE, greatest deficits in hip mm, and hip flexors are pain-limited.    BED  MOBILITY:  Imparied per pt report. Has difficulty rolling and getting out of his bed   TRANSFERS: Assistive device utilized: Environmental consultant - 2 wheeled  Sit to stand: Modified independence Stand to sit: Modified independence Chair to chair: SBA  STAIRS: Reports he avoids stairs due to difficulty   GAIT: Gait pattern:  festinating gait, crouched posture Distance walked: , clinic distances Assistive device utilized: Walker - 2 wheeled Level of assistance: CGA Comments: with large-amplitude cuing for step-length pt able to improve during visit, significant difficulty with hypokinetic movement with turning, freezing   FUNCTIONAL TESTS:  5 times sit to stand: 31 sec with use of UE  : 0.67 m/s with RW  BERG 9/3: 26  TUG 9/3: 35.45sec  PATIENT SURVEYS:  FOTO 12 (goal 36)  TODAY'S TREATMENT:                                                                                                                              DATE: 02/13/23  TE Bolster adduction 20 x 3 sec holds seated NMR Seated PWR moves x 10 ea  - UP, rock, twist, and step   -modified PWR! Twist to prevent L shoulder pain ( turned into just trunk rotation)    Activity Description: random tapping for forward and lateral stepping  Activity Setting:  The Blaze Pod Random setting was chosen to enhance cognitive processing and agility, providing an unpredictable environment to simulate real-world scenarios, and fostering quick reactions and adaptability.   Number of Pods:  4 Cycles/Sets:  4 Duration (Time or Hit Count):  20 hits   Pt required occasional therapeutic rest breaks due fatigue, PT was quick to ask when pt appeared to be fatiguing in order to prevent excessive fatigue.   PATIENT EDUCATION: Education details: recommendation for speech therapy (pt agreeable), exam findings, goals, plan Person educated: Patient Education method: Explanation, Demonstration, and Verbal cues Education comprehension:  verbalized understanding, returned demonstration, verbal cues required, and needs further education  HOME EXERCISE PROGRAM:  Seated PWR! Moves added to program provided by Grier Rocher PT, DPT   SHORT TERM GOALS: Target date: 02/06/2023   Patient will be independent in home exercise program to improve strength/mobility for better functional independence with ADLs. Baseline: to be initiated Goal status: INITIAL   LONG TERM GOALS: Target date: 03/20/2023    Patient will increase FOTO score to equal to or greater than  36   to demonstrate statistically significant improvement in mobility and quality of life.  Baseline: 12 Goal status: INITIAL  2.  Patient (> 24 years old) will complete five times sit to stand test in < 15 seconds indicating an increased LE strength and improved balance. Baseline: 31 sec use of BUE to push off chair Goal status: INITIAL  3.  Patient will increase Berg Balance score by > 6 points to demonstrate decreased fall risk during functional activities Baseline: 26 Goal status: INITIAL  4.  Patient will increase 10 meter walk test to >1.104m/s as to improve gait speed for better community ambulation and to reduce fall risk. Baseline: 0.67 m/s with RW Goal status: INITIAL  5.  Patient will reduce timed up and go to <11 seconds to reduce  fall risk and demonstrate improved transfer/gait ability. Baseline: 35.45sec  Goal status: INITIAL    ASSESSMENT:  CLINICAL IMPRESSION:   Patient arrived with good motivation form completion of pt activities.   Pt introduced to formal seated PWR! Activities. Pt required cues for proper performance of exercises and was provided with handout and cues for practice per his request.Continued with blaze pod training with good result this date.   Pt will continue to benefit from skilled physical therapy intervention to address impairments, improve QOL, and attain therapy goals.    OBJECTIVE IMPAIRMENTS: Abnormal gait,  decreased activity tolerance, decreased balance, decreased coordination, decreased endurance, decreased mobility, difficulty walking, decreased ROM, decreased strength, impaired flexibility, impaired tone, impaired UE functional use, improper body mechanics, postural dysfunction, and pain.   ACTIVITY LIMITATIONS: carrying, lifting, bending, sitting, standing, squatting, stairs, transfers, bed mobility, continence, bathing, toileting, dressing, hygiene/grooming, and locomotion level  PARTICIPATION LIMITATIONS: meal prep, cleaning, laundry, driving, shopping, community activity, occupation, and yard work  PERSONAL FACTORS: Age and 3+ comorbidities: PMH significant for HTN, trigeminal neuralgia, barrett's esophagus with esophagitis, hypothyroidism, DDD, dysphagia, dyspnea on exertion, chronic cluster headache not intractable, peripheral edema, primary Parkinson's disease, B12 deficiency  are also affecting patient's functional outcome.   REHAB POTENTIAL: Good  CLINICAL DECISION MAKING: Evolving/moderate complexity  EVALUATION COMPLEXITY: Moderate  PLAN:  PT FREQUENCY: 2x/week  PT DURATION: 12 weeks  PLANNED INTERVENTIONS: Therapeutic exercises, Therapeutic activity, Neuromuscular re-education, Balance training, Gait training, Patient/Family education, Self Care, Joint mobilization, Stair training, Vestibular training, Canalith repositioning, Visual/preceptual remediation/compensation, Orthotic/Fit training, DME instructions, Electrical stimulation, Spinal mobilization, Cryotherapy, Moist heat, Splintting, Taping, Manual therapy, and Re-evaluation  PLAN FOR NEXT SESSION:   Continue to advance HEP. Rotation movements and improved step length. Various planes of movement initiation. Metronome (around 60 BPM) with walking, particularly in doorways and turns.  Norman Herrlich, PT, DPT 02/13/2023, 9:20 AM

## 2023-02-18 ENCOUNTER — Ambulatory Visit: Payer: Medicare Other

## 2023-02-18 DIAGNOSIS — R278 Other lack of coordination: Secondary | ICD-10-CM | POA: Diagnosis not present

## 2023-02-18 DIAGNOSIS — R471 Dysarthria and anarthria: Secondary | ICD-10-CM | POA: Diagnosis not present

## 2023-02-18 DIAGNOSIS — R2681 Unsteadiness on feet: Secondary | ICD-10-CM | POA: Diagnosis not present

## 2023-02-18 DIAGNOSIS — M6281 Muscle weakness (generalized): Secondary | ICD-10-CM

## 2023-02-18 DIAGNOSIS — R262 Difficulty in walking, not elsewhere classified: Secondary | ICD-10-CM

## 2023-02-18 NOTE — Therapy (Signed)
OUTPATIENT PHYSICAL THERAPY NEURO TREATMENT   Patient Name: Arthur Richardson MRN: [id number] DOB:[birth year], 72 y.o., male Today's Date: 02/18/2023   PCP: Dorcas Carrow, DO  REFERRING PROVIDER: Lonell Face, MD   END OF SESSION:  PT End of Session - 02/18/23 1117     Visit Number 9    Number of Visits 25    Date for PT Re-Evaluation 03/20/23    Progress Note Due on Visit 10    PT Start Time 1020    PT Stop Time 1100    PT Time Calculation (min) 40 min    Equipment Utilized During Treatment Gait belt    Activity Tolerance Patient tolerated treatment well    Behavior During Therapy WFL for tasks assessed/performed                  Past Medical History:  Diagnosis Date   Hyperlipidemia    Hypertension    Past Surgical History:  Procedure Laterality Date   LITHOTRIPSY  09/1998 & 07/29/1999   Due to nephrolithiasis   PROSTATE SURGERY     Biopsy= Negative   TONSILLECTOMY     Patient Active Problem List   Diagnosis Date Noted   B12 deficiency 12/19/2022   Primary Parkinson's disease (HCC) 07/01/2022   Senile purpura (HCC) 07/01/2022   Chronic cluster headache, not intractable 07/30/2021   Peripheral edema 07/30/2021   Dyspnea on exertion 06/08/2020   Trigger point of left shoulder region 08/21/2017   BPH with obstruction/lower urinary tract symptoms 02/20/2017   Advanced care planning/counseling discussion 02/20/2017   DDD (degenerative disc disease), lumbar 08/21/2016   Nephrolithiasis 02/01/2016   Barrett's esophagus with esophagitis 10/11/2014   Trigeminal neuralgia 10/11/2014   Hypertension 10/11/2014   Hyperlipidemia 10/11/2014   Hypothyroidism 10/11/2014   Dysphagia 08/04/2014    ONSET DATE: November 2023  REFERRING DIAG: 20.A1 (ICD-10-CM) - Parkinson's disease   THERAPY DIAG:  Other lack of coordination  Unsteadiness on feet  Difficulty in walking, not elsewhere classified  Muscle weakness (generalized)  Rationale for  Evaluation and Treatment: Rehabilitation  SUBJECTIVE:                                                                                                                                                                                             SUBJECTIVE STATEMENT:   Pt reports R side low back and hip pain that goes into R glute when he sits too long (pt with hx of hip/LE pain). Pt reports no stumbles/falls.    Pt accompanied by: self  PERTINENT HISTORY:   The pt is a pleasant 72 yo male  referred to PT for impairments due to Parkinson's. He ambulates to session using RW.  Pt's primary concerns include decreased strength and poor standing endurance. Pt also reports if he sits for too long he can have pain in BLE and hips and his legs can "lock up." Pt also with decreased endurance with ambulation, poor balance, and decreased UE/grip strength. Pt using RW today but uses a cane at home for shorter distances. Pt has difficulty with ADLs due to PD symptoms and his wife typically helps him. He has trouble with bed mobility, specifically rolling over, and difficulty with transfers. Pt avoids stairs. Pt reports freezing episodes. He says he was told to stop PD medications last neurology visit due to severe vertigo. He has follow-up with Dr. Sherryll Burger in December to try new PD medication. Pt's wife helps pt with ADLs. He is a taxidermist. He used to be active prior to onset of PD symptoms and would run a mile/day. Pt reports PT years ago for back pain. PMH significant for HTN, trigeminal neuralgia, barrett's esophagus with esophagitis, hypothyroidism, DDD, dysphagia, dyspnea on exertion, chronic cluster headache not intractable, peripheral edema, primary Parkinson's disease, B12 deficiency  PAIN:  Are you having pain? Yes: NPRS scale: not rated/10 Pain location: hips, legs  Pain description:   Aggravating factors: sitting too long Relieving factors: standing up briefly  PRECAUTIONS: Fall  RED FLAGS: Bowel  or bladder incontinence: Yes: bladder   Hx of CA: negative Hx of compression fx: negative AAA: negative   WEIGHT BEARING RESTRICTIONS: No  FALLS: Has patient fallen in last 6 months? No, not in the last six months but 2 falls a year ago states "when my leg gets to jumping."   LIVING ENVIRONMENT: Lives with: lives with their spouse and son Lives in: Other double wide Stairs: Yes: External: 5 steps; bilateral hand-rails Has following equipment at home: Single point cane and Walker - 2 wheeled  PLOF: Independent  PATIENT GOALS: Improve strength and decrease pain  OBJECTIVE:   DIAGNOSTIC FINDINGS:    Via chart  03/05/2022 NM BRAIN "FINDINGS: There is significant loss of activity within the entire RIGHT striatum. Relative maintained activity in the head of the LEFT caudate nucleus. Loss of activity in the LEFT putamen.   IMPRESSION: Mark asymmetric decreased radiotracer activity in the RIGHT striatum is a finding typical of Parkinsonian syndrome pathology.   Of note, DaTSCAN is not diagnostic of Parkinsonian syndromes, which remains a clinical diagnosis. DaTscan is an adjuvant test to aid in the clinical diagnosis of Parkinsonian syndromes.     Electronically Signed   By: Genevive Bi M.D.   On: 03/05/2022 14:41"  COGNITION: Overall cognitive status: Within functional limits for tasks assessed   SENSATION: WFL to light touch with testing UE/LE  Pt reports some tingling felt in his low back  COORDINATION: Tremor in bilat UE Some difficulty with rapid alt UE movement WFL chin<>target (pen) WFL rapid alt movement LE  EDEMA:  Pt reports no swelling  MUSCLE TONE: tremor bilat UE, observed rigidity with movement   POSTURE: rounded shoulders, forward head, and increased thoracic kyphosis   LOWER EXTREMITY MMT:    Grossly 4/5 bilat LE, greatest deficits in hip mm, and hip flexors are pain-limited.    BED MOBILITY:  Imparied per pt report. Has difficulty  rolling and getting out of his bed   TRANSFERS: Assistive device utilized: Environmental consultant - 2 wheeled  Sit to stand: Modified independence Stand to sit: Modified independence Chair to chair: SBA  STAIRS: Reports he avoids stairs due to difficulty   GAIT: Gait pattern:  festinating gait, crouched posture Distance walked: , clinic distances Assistive device utilized: Walker - 2 wheeled Level of assistance: CGA Comments: with large-amplitude cuing for step-length pt able to improve during visit, significant difficulty with hypokinetic movement with turning, freezing   FUNCTIONAL TESTS:  5 times sit to stand: 31 sec with use of UE  : 0.67 m/s with RW  BERG 9/3: 26  TUG 9/3: 35.45sec  PATIENT SURVEYS:  FOTO 12 (goal 36)  TODAY'S TREATMENT:                                                                                                                              DATE: 02/18/23  Gait belt donned throughout to ensure pt safety  NMR: Large amplitude training to override hypokinetic and bradykinetic movement: Seated forward reach>downward reach>reach up>reach out to sides 10x with 5 sec hold Seated large amplitude twist and reach 10x each side Finger flicks 2x10 bilat UE Standing large amplitude side step 10x each way, then with UE reach added 5x each way - pt utilizes UUE support throughout and close CGA  Gait with RW, focus on upright posture, large-steps 2x148 ft with rest between sets. Cuing throughout for strategies for freezing episodes with turning around corners: external target to step on, big movements, rocking  Seated: Balance pod big LE stomp/tap 2x10 each LE  Seated rock and reach with fwd step 2x10 each LE position - demo throughout for sequencing  Finger flicks x multiple reps  TE Bolster adduction 2x10 x 3 sec holds seated - for pain modulation      PATIENT EDUCATION: Education details: Pt educated throughout session about proper posture and  technique with exercises. Improved exercise technique, movement at target joints, use of target muscles after min to mod verbal, visual, tactile cues.   Person educated: Patient Education method: Explanation, Demonstration, and Verbal cues Education comprehension: verbalized understanding, returned demonstration, verbal cues required, and needs further education  HOME EXERCISE PROGRAM:  Seated PWR! Moves added to program provided by Grier Rocher PT, DPT   SHORT TERM GOALS: Target date: 02/06/2023   Patient will be independent in home exercise program to improve strength/mobility for better functional independence with ADLs. Baseline: to be initiated Goal status: INITIAL   LONG TERM GOALS: Target date: 03/20/2023    Patient will increase FOTO score to equal to or greater than  36   to demonstrate statistically significant improvement in mobility and quality of life.  Baseline: 12 Goal status: INITIAL  2.  Patient (> 17 years old) will complete five times sit to stand test in < 15 seconds indicating an increased LE strength and improved balance. Baseline: 31 sec use of BUE to push off chair Goal status: INITIAL  3.  Patient will increase Berg Balance score by > 6 points to demonstrate decreased fall risk during functional activities Baseline: 26 Goal status:  INITIAL  4.  Patient will increase 10 meter walk test to >1.66m/s as to improve gait speed for better community ambulation and to reduce fall risk. Baseline: 0.67 m/s with RW Goal status: INITIAL  5.  Patient will reduce timed up and go to <11 seconds to reduce fall risk and demonstrate improved transfer/gait ability. Baseline: 35.45sec  Goal status: INITIAL    ASSESSMENT:  CLINICAL IMPRESSION:  Pt with excellent motivation to participate in session. Primary focus was on large amplitude movements to override hypokinetic, bradykinetic movement and strategies for freezing episodes with gait. Pt showed within-session  improvement following cuing for these techniques, but will require further instruction. Pt will continue to benefit from skilled physical therapy intervention to address impairments, improve QOL, and attain therapy goals.    OBJECTIVE IMPAIRMENTS: Abnormal gait, decreased activity tolerance, decreased balance, decreased coordination, decreased endurance, decreased mobility, difficulty walking, decreased ROM, decreased strength, impaired flexibility, impaired tone, impaired UE functional use, improper body mechanics, postural dysfunction, and pain.   ACTIVITY LIMITATIONS: carrying, lifting, bending, sitting, standing, squatting, stairs, transfers, bed mobility, continence, bathing, toileting, dressing, hygiene/grooming, and locomotion level  PARTICIPATION LIMITATIONS: meal prep, cleaning, laundry, driving, shopping, community activity, occupation, and yard work  PERSONAL FACTORS: Age and 3+ comorbidities: PMH significant for HTN, trigeminal neuralgia, barrett's esophagus with esophagitis, hypothyroidism, DDD, dysphagia, dyspnea on exertion, chronic cluster headache not intractable, peripheral edema, primary Parkinson's disease, B12 deficiency  are also affecting patient's functional outcome.   REHAB POTENTIAL: Good  CLINICAL DECISION MAKING: Evolving/moderate complexity  EVALUATION COMPLEXITY: Moderate  PLAN:  PT FREQUENCY: 2x/week  PT DURATION: 12 weeks  PLANNED INTERVENTIONS: Therapeutic exercises, Therapeutic activity, Neuromuscular re-education, Balance training, Gait training, Patient/Family education, Self Care, Joint mobilization, Stair training, Vestibular training, Canalith repositioning, Visual/preceptual remediation/compensation, Orthotic/Fit training, DME instructions, Electrical stimulation, Spinal mobilization, Cryotherapy, Moist heat, Splintting, Taping, Manual therapy, and Re-evaluation  PLAN FOR NEXT SESSION:   Continue to advance HEP. Rotation movements and improved step  length. Various planes of movement initiation. Metronome (around 60 BPM) with walking, particularly in doorways and turns, continue plan  Baird Kay, PT, DPT 02/18/2023, 11:24 AM

## 2023-02-18 NOTE — Therapy (Addendum)
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 Patient is making progress toward LTGs and met all STGs this reporting period. See skilled intervention, clinical impressions, and goals below for details.    Patient Name: Arthur Richardson MRN: [id number] DOB:07-22-1950, 72 y.o., male Today's Date: 02/18/2023  PCP: Olevia Perches, DO  REFERRING PROVIDER: Cristopher Peru, MD   End of Session - 02/18/23 1236     Authorization Type [employer]    Progress Note Due on Visit 20    SLP Start Time 1001    SLP Stop Time  1045    SLP Time Calculation (min) 44 min             Past Medical History:  Diagnosis Date   Hyperlipidemia    Hypertension    Past Surgical History:  Procedure Laterality Date   LITHOTRIPSY  09/1998 & 07/29/1999   Due to nephrolithiasis   PROSTATE SURGERY     Biopsy= Negative   TONSILLECTOMY     Patient Active Problem List   Diagnosis Date Noted   B12 deficiency 12/19/2022   Primary Parkinson's disease (HCC) 07/01/2022   Senile purpura (HCC) 07/01/2022   Chronic cluster headache, not intractable 07/30/2021   Peripheral edema 07/30/2021   Dyspnea on exertion 06/08/2020   Trigger point of left shoulder region 08/21/2017   BPH with obstruction/lower urinary tract symptoms 02/20/2017   Advanced care planning/counseling discussion 02/20/2017   DDD (degenerative disc disease), lumbar 08/21/2016   Nephrolithiasis 02/01/2016   Barrett's esophagus with esophagitis 10/11/2014   Trigeminal neuralgia 10/11/2014   Hypertension 10/11/2014   Hyperlipidemia 10/11/2014   Hypothyroidism 10/11/2014   Dysphagia 08/04/2014    ONSET DATE: 12/26/22 referral date; pt reports being diagnosed with  Parkinson's Disease in November 2023  REFERRING DIAG: Parkinson's Disease  THERAPY DIAG:  Dysarthria and anarthria  Rationale for Evaluation and Treatment Rehabilitation  SUBJECTIVE:   SUBJECTIVE STATEMENT: Pt alert, pleasant, and cooperative. Pt using RW. Festinating gait appreciated. Endorsed feeling "tired and achy." Pt accompanied by: significant other; wife, [name], waited in waiting room    PERTINENT HISTORY: Pt 72 yo male referred to SLP for impairments due to Parkinson's Disease. PMHx significant for HTN, trigeminal neuralgia, barrett's esophagus with esophagitis, hypothyroidism, DDD, dysphagia (reports esophageal dilation in past), dyspnea on exertion, chronic cluster headache not intractable, peripheral edema, primary Parkinson's disease, B12 deficiency  DIAGNOSTIC FINDINGS: DaTSCAN, 02/2022, "Mark asymmetric decreased radiotracer activity in the RIGHT striatum is a finding typical of Parkinsonian syndrome pathology."  PAIN:  Are you having pain? Yes, B hips, 10/10; improved with pt initiated repositioning  FALLS: Has patient fallen in last 6 months?  Yes, Comment: reports falling in front of his house last week; pt sees OP PT  LIVING ENVIRONMENT: Lives with: lives with their family; spouse and adult son  Lives in: Mobile home  PLOF:  Level of assistance: Comment: modified independent Employment: Retired, Other: works as a Geneticist, molecular   PATIENT GOALS    "for people to hear me"  OBJECTIVE TODAY'S TREATMENT: Reviewed of changes to voice in Parkinson's Disease and speaking loud and with intent. Reviewed HEP. With use of Voice Analyst App to capture acoustic data and provide biofeedback, pt completed as follows: Warm Up: May-Me-My-Moe-Moo - x10 - min verbal cues - average  83 dB  Sustained "Ah": x10 with min verbal cues; averaged 5s, average 80dB tendency to trail off and phonate on residual volume; pt benefited from cues to voice on exhalation Ascending Pitch Glides: x10  with min verbal cues; 126-177 Hz, average 82dB Descending Pitch Glides: x10 with min verbal cues: 174-116 Hz, average 80dB  Oral reading of functional phrases loudly and intentionally: Mod verbal cues, average 82 dB  Conversational speech: ~73 dB, mod verbal cues to improve breath support due to speaking on residual volume  Reduced carryover of compensations into informal exchanges noted. Additionally, pt benefited from rest breaks between ex's in HEP due to complaints of SOB.  [id number]: Pt reports completing EMST HEP daily. Pt completed x5 sets of x5 breaths @ 15cmH2O with rest breaks PRN. Attempted new training threshold 35cmH2O; however, pt unable to complete.  Occasional verbal/visual cueing needed to utilize hand placement to facilitate adequate lip seal.      PATIENT EDUCATION: Education details: as above Person educated: Patient and Spouse Education method: Chief Technology Officer;  Education comprehension: verbalized understanding and needs further education   HOME EXERCISE PROGRAM: Yes - ex's as above and functional phrases for repetition, EMST   GOALS: Goals reviewed with patient? Yes  SHORT TERM GOALS: Target date: 10 sessions  Patient will demo HEP for dysarthria with min cues.  Baseline: Goal status: MET  2.  Patient will maintain adequate vocal quality and intensity (>70dB) in sentence level responses 90% accuracy.  Baseline:  Goal status: MET  3.  Patient will use dysarthria compensations in 2-3 sentence responses 90% accuracy with moderate cues.  Baseline:  Goal status: MET  4.  Patient will complete PROM. Baseline:  Goal status: MET   LONG TERM GOALS: Target date: 12 weeks  Patient will demo HEP for dysarthria independently.  Baseline:  Goal status: INITIAL  2.  Patient will maintain intensity (avg >70dB) in 10 minutes conversation with modified independence  Baseline:  Goal status: INITIAL  3.  Patient will use dysarthria compensations (slow,  loud, overpronounce, pause) for intelligibility >90% in 10 minutes conversation.  Baseline:  Goal status: INITIAL  4.  Patient will report improvement in PROM.  Baseline: VHI 73/120 (severe) Goal status: INITIAL    ASSESSMENT:  CLINICAL IMPRESSION: Patient is a 72  y.o. male who was seen today for speech therapy session in setting of Parkinson's Disease. Patient presents with at least a moderate hypokinetic dysarthria characterized by reduced vocal intensity, reduced breath support, reduced pitch variability, intermittent rapid rate, and waxing/waning vocal quality (hypophonic, hoarse, aphonic, near whisper). Pt with difficulty maintaining adequate vocal intensity and quality and well as short rushes of speech which impact intelligibility. See details of tx above. Pt is a good candidate for intensive ST services; however, due to limited availability, we will utilize intensity-based approach 2x per week for 12 weeks. I recommend skilled ST to improve vocal quality, endurance, and intensity for improved intelligibility, and to meet other vocal demands of work, home, and socialization.     OBJECTIVE IMPAIRMENTS include dysarthria. These impairments are limiting patient from effectively communicating at home and in community. Factors affecting potential to achieve goals and functional outcome are medical prognosis. Patient will benefit from skilled SLP services to address above impairments and improve overall function.  REHAB POTENTIAL: Good  PLAN: SLP FREQUENCY: 2x/week  SLP DURATION: 12 weeks  PLANNED INTERVENTIONS: Cueing hierachy, Internal/external aids, Functional tasks, SLP instruction and feedback, Compensatory strategies, Patient/family education, and Re-evaluation   Clyde Canterbury,  M.S., CCC-SLP Speech-Language Pathologist [place] [facility] [phone] Arnette Felts)   [facility] [address] [place], [place], [address] Phone: [phone]   Fax:  [phone]

## 2023-02-19 ENCOUNTER — Ambulatory Visit: Payer: Medicare Other | Admitting: Physical Therapy

## 2023-02-20 ENCOUNTER — Ambulatory Visit: Payer: Medicare Other

## 2023-02-20 DIAGNOSIS — R471 Dysarthria and anarthria: Secondary | ICD-10-CM | POA: Diagnosis not present

## 2023-02-20 DIAGNOSIS — R262 Difficulty in walking, not elsewhere classified: Secondary | ICD-10-CM | POA: Diagnosis not present

## 2023-02-20 DIAGNOSIS — R278 Other lack of coordination: Secondary | ICD-10-CM | POA: Diagnosis not present

## 2023-02-20 DIAGNOSIS — M6281 Muscle weakness (generalized): Secondary | ICD-10-CM | POA: Diagnosis not present

## 2023-02-20 DIAGNOSIS — R2681 Unsteadiness on feet: Secondary | ICD-10-CM | POA: Diagnosis not present

## 2023-02-20 NOTE — Therapy (Signed)
OUTPATIENT SPEECH LANGUAGE PATHOLOGY PARKINSON'S TREATMENT   Patient Name: Arthur Richardson MRN: [id number] DOB:[birth year], 72 y.o., male Today's Date: 02/20/2023  PCP: Olevia Perches, DO  REFERRING PROVIDER: Cristopher Peru, MD   End of Session - 02/20/23 1021     Visit Number 11    Number of Visits 25    Date for SLP Re-Evaluation 03/27/23    Authorization Type [employer] Medicare    SLP Start Time 1015    SLP Stop Time  1100    SLP Time Calculation (min) 45 min             Past Medical History:  Diagnosis Date   Hyperlipidemia    Hypertension    Past Surgical History:  Procedure Laterality Date   LITHOTRIPSY  09/1998 & 07/29/1999   Due to nephrolithiasis   PROSTATE SURGERY     Biopsy= Negative   TONSILLECTOMY     Patient Active Problem List   Diagnosis Date Noted   B12 deficiency 12/19/2022   Primary Parkinson's disease (HCC) 07/01/2022   Senile purpura (HCC) 07/01/2022   Chronic cluster headache, not intractable 07/30/2021   Peripheral edema 07/30/2021   Dyspnea on exertion 06/08/2020   Trigger point of left shoulder region 08/21/2017   BPH with obstruction/lower urinary tract symptoms 02/20/2017   Advanced care planning/counseling discussion 02/20/2017   DDD (degenerative disc disease), lumbar 08/21/2016   Nephrolithiasis 02/01/2016   Barrett's esophagus with esophagitis 10/11/2014   Trigeminal neuralgia 10/11/2014   Hypertension 10/11/2014   Hyperlipidemia 10/11/2014   Hypothyroidism 10/11/2014   Dysphagia 08/04/2014    ONSET DATE: 12/26/22 referral date; pt reports being diagnosed with Parkinson's Disease in November 2023  REFERRING DIAG: Parkinson's Disease  THERAPY DIAG:  Dysarthria and anarthria  Rationale for Evaluation and Treatment Rehabilitation  SUBJECTIVE:   SUBJECTIVE STATEMENT: Pt alert, pleasant, and cooperative. Pt using RW. Festinating gait appreciated. Endorsed feeling "tired." Pt accompanied by: significant other; wife, Arthur Richardson,  waited in waiting room    PERTINENT HISTORY: Pt 72 yo male referred to SLP for impairments due to Parkinson's Disease. PMHx significant for HTN, trigeminal neuralgia, barrett's esophagus with esophagitis, hypothyroidism, DDD, dysphagia (reports esophageal dilation in past), dyspnea on exertion, chronic cluster headache not intractable, peripheral edema, primary Parkinson's disease, B12 deficiency  DIAGNOSTIC FINDINGS: [employer], 02/2022, "Mark asymmetric decreased radiotracer activity in the RIGHT striatum is a finding typical of Parkinsonian syndrome pathology."  PAIN:  Are you having pain? Yes, B hips, 10/10; improved with pt initiated repositioning  FALLS: Has patient fallen in last 6 months?  Yes, Comment: reports falling in front of his house last week; pt sees OP PT  LIVING ENVIRONMENT: Lives with: lives with their family; spouse and adult son  Lives in: Mobile home  PLOF:  Level of assistance: Comment: modified independent Employment: Retired, Other: works as a Geneticist, molecular   PATIENT GOALS    "for people to hear me"  OBJECTIVE TODAY'S TREATMENT: Reviewed of changes to voice in Parkinson's Disease and speaking loud and with intent. Reviewed HEP. With use of Voice Analyst App to capture acoustic data and provide biofeedback, pt completed as follows: Warm Up: May-Me-My-Moe-Moo - x10 - min verbal cues - average 85 dB  Sustained "Ah": x10 with mod/max verbal cues; averaged 5s, average 80 dB tendency to trail off and phonate on residual volume; pt benefited from cues to voice on exhalation Ascending Pitch Glides: x10 with mod/max verbal cues; 141-178 Hz, average 81 dB Descending Pitch Glides: x10 with mod/max verbal cues: 188-106  Hz, average 78 dB  Oral reading of functional phrases loudly and intentionally: Mod verbal cues, average 78 dB  Conversational speech: ~73 dB, mod verbal cues to improve breath support due to speaking on residual volume  Reduced carryover of compensations into  informal exchanges noted. Additionally, pt benefited from rest breaks between ex's in HEP due to complaints of SOB.  [id number]: Pt reports completing EMST HEP daily. Pt completed x5 sets of x5 breaths @ 15cmH2O with rest breaks PRN. Attempted new training threshold 35cmH2O; however, pt unable to complete.  Occasional verbal/visual cueing needed to utilize hand placement to facilitate adequate lip seal.      PATIENT EDUCATION: Education details: as above Person educated: Patient and Spouse Education method: Chief Technology Officer;  Education comprehension: verbalized understanding and needs further education   HOME EXERCISE PROGRAM: Yes - ex's as above and functional phrases for repetition, EMST   GOALS: Goals reviewed with patient? Yes  SHORT TERM GOALS: Target date: 10 sessions  Patient will demo HEP for dysarthria with min cues.  Baseline: Goal status: INITIAL  2.  Patient will maintain adequate vocal quality and intensity (>70dB) in sentence level responses 90% accuracy.  Baseline:  Goal status: INITIAL  3.  Patient will use dysarthria compensations in 2-3 sentence responses 90% accuracy with moderate cues.  Baseline:  Goal status: INITIAL  4.  Patient will complete PROM. Baseline:  Goal status: MET   LONG TERM GOALS: Target date: 12 weeks  Patient will demo HEP for dysarthria independently.  Baseline:  Goal status: INITIAL  2.  Patient will maintain intensity (avg >70dB) in 10 minutes conversation with modified independence  Baseline:  Goal status: INITIAL  3.  Patient will use dysarthria compensations (slow, loud, overpronounce, pause) for intelligibility >90% in 10 minutes conversation.  Baseline:  Goal status: INITIAL  4.  Patient will report improvement in PROM.  Baseline: VHI 73/120 (severe) Goal status: INITIAL    ASSESSMENT:  CLINICAL IMPRESSION: Patient is a 72  y.o. male who was seen today for speech therapy session in setting of Parkinson's  Disease. Patient presents with at least a moderate hypokinetic dysarthria characterized by reduced vocal intensity, reduced breath support, reduced pitch variability, intermittent rapid rate, and waxing/waning vocal quality (hypophonic, hoarse, aphonic, near whisper). Pt with difficulty maintaining adequate vocal intensity and quality and well as short rushes of speech which impact intelligibility. See details of tx above. Pt is a good candidate for intensive ST services; however, due to limited availability, we will utilize intensity-based approach 2x per week for 12 weeks. I recommend skilled ST to improve vocal quality, endurance, and intensity for improved intelligibility, and to meet other vocal demands of work, home, and socialization.     OBJECTIVE IMPAIRMENTS include dysarthria. These impairments are limiting patient from effectively communicating at home and in community. Factors affecting potential to achieve goals and functional outcome are medical prognosis. Patient will benefit from skilled SLP services to address above impairments and improve overall function.  REHAB POTENTIAL: Good  PLAN: SLP FREQUENCY: 2x/week  SLP DURATION: 12 weeks  PLANNED INTERVENTIONS: Cueing hierachy, Internal/external aids, Functional tasks, SLP instruction and feedback, Compensatory strategies, Patient/family education, and Re-evaluation   Clyde Canterbury, M.S., CCC-SLP Speech-Language Pathologist [place] [facility] [phone] Arnette Felts)   [facility] at [facility] [address] [place], [place], [address] Phone: [phone]   Fax:  [phone]

## 2023-02-25 ENCOUNTER — Ambulatory Visit: Payer: Medicare Other

## 2023-02-25 DIAGNOSIS — M6281 Muscle weakness (generalized): Secondary | ICD-10-CM

## 2023-02-25 DIAGNOSIS — R2681 Unsteadiness on feet: Secondary | ICD-10-CM

## 2023-02-25 DIAGNOSIS — R262 Difficulty in walking, not elsewhere classified: Secondary | ICD-10-CM

## 2023-02-25 DIAGNOSIS — R471 Dysarthria and anarthria: Secondary | ICD-10-CM | POA: Diagnosis not present

## 2023-02-25 DIAGNOSIS — R278 Other lack of coordination: Secondary | ICD-10-CM | POA: Diagnosis not present

## 2023-02-25 NOTE — Therapy (Signed)
OUTPATIENT SPEECH LANGUAGE PATHOLOGY PARKINSON'S TREATMENT / DISCHARGE SUMMARY   Patient Name: Arthur Richardson MRN: [id number] DOB:01/31/1951, 71 y.o., male Today's Date: 02/25/2023  PCP: Olevia Perches, DO  REFERRING PROVIDER: Cristopher Peru, MD   End of Session - 02/25/23 0938     Visit Number 12    Number of Visits 25    Date for SLP Re-Evaluation 03/27/23    Authorization Type [employer] Medicare    Progress Note Due on Visit 20    SLP Start Time 0850    SLP Stop Time  0930    SLP Time Calculation (min) 40 min    Activity Tolerance Patient tolerated treatment well             Past Medical History:  Diagnosis Date   Hyperlipidemia    Hypertension    Past Surgical History:  Procedure Laterality Date   LITHOTRIPSY  09/1998 & 07/29/1999   Due to nephrolithiasis   PROSTATE SURGERY     Biopsy= Negative   TONSILLECTOMY     Patient Active Problem List   Diagnosis Date Noted   B12 deficiency 12/19/2022   Primary Parkinson's disease (HCC) 07/01/2022   Senile purpura (HCC) 07/01/2022   Chronic cluster headache, not intractable 07/30/2021   Peripheral edema 07/30/2021   Dyspnea on exertion 06/08/2020   Trigger point of left shoulder region 08/21/2017   BPH with obstruction/lower urinary tract symptoms 02/20/2017   Advanced care planning/counseling discussion 02/20/2017   DDD (degenerative disc disease), lumbar 08/21/2016   Nephrolithiasis 02/01/2016   Barrett's esophagus with esophagitis 10/11/2014   Trigeminal neuralgia 10/11/2014   Hypertension 10/11/2014   Hyperlipidemia 10/11/2014   Hypothyroidism 10/11/2014   Dysphagia 08/04/2014    ONSET DATE: 12/26/22 referral date; pt reports being diagnosed with Parkinson's Disease in November 2023  REFERRING DIAG: Parkinson's Disease  THERAPY DIAG:  Dysarthria and anarthria  Rationale for Evaluation and Treatment Rehabilitation  SUBJECTIVE:   SUBJECTIVE STATEMENT: Pt alert, pleasant, and cooperative. Pt using RW.  Festinating gait appreciated. Endorsed feeling "tired." Pt accompanied by: significant other; wife, [name], waited in waiting room    PERTINENT HISTORY: Pt 72 yo male referred to SLP for impairments due to Parkinson's Disease. PMHx significant for HTN, trigeminal neuralgia, barrett's esophagus with esophagitis, hypothyroidism, DDD, dysphagia (reports esophageal dilation in past), dyspnea on exertion, chronic cluster headache not intractable, peripheral edema, primary Parkinson's disease, B12 deficiency  DIAGNOSTIC FINDINGS: [employer], 02/2022, "Mark asymmetric decreased radiotracer activity in the RIGHT striatum is a finding typical of Parkinsonian syndrome pathology."  PAIN:  Are you having pain? Yes, B hips, 10/10; improved with pt initiated repositioning  FALLS: Has patient fallen in last 6 months?  Yes, Comment: reports falling in front of his house last week; pt sees OP PT  LIVING ENVIRONMENT: Lives with: lives with their family; spouse and adult son  Lives in: Mobile home  PLOF:  Level of assistance: Comment: modified independent Employment: Retired, Other: works as a Geneticist, molecular   PATIENT GOALS    "for people to hear me"  OBJECTIVE TODAY'S TREATMENT: Acoustic re-assessment completed, see today's results in blue and initial evaluation in black. Sustained "ah" maximum phonation time: 4.6 seconds, 5.8 seconds Sustained "ah" loudness average: 76 dB, 80 dB Average fundamental frequency during sustained "ah":136 Hz, 145 Hz   (WNL of  145 Hz +/- 23 for gender)  Oral reading (passage) loudness average: 64 dB, 78 dB Oral reading loudness range: 59-78 dB, 64-88 dB Conversational pitch average: 129 Hz, 123 Hz  Conversational  pitch range: 120-153 Hz, 101-150 Hz Conversational loudness average: 66 dB, 76 dB Conversational loudness range: 57-83 dB, 65-84 dB   Pt repeated the Voice Handicap Index with results as follows:  VOICE HANDICAP INDEX (VHI)  The Voice Handicap Index is comprised  of a series of questions to assess the patient's perception of their voice. It is designed to evaluate the emotional, physical and functional components of the voice problem.  Functional: 23/40  Physical: 33/40 Emotional: 10/40 Total:  66/120- improved from 73/120 on initial evaluation  EMST75: Pt reports completing EMST HEP daily. Pt completed x5 sets of x5 breaths @ 15cmH2O with rest breaks PRN. Attempted new training threshold 35cmH2O; however, pt unable to complete.  Occasional verbal/visual cueing needed to utilize hand placement to facilitate adequate lip seal.  Reviewed HEP, progress to date, and SLP POC with pt and wife.      PATIENT EDUCATION: Education details: as above Person educated: Patient and Spouse Education method: Chief Technology Officer;  Education comprehension: verbalized understanding and needs further education   HOME EXERCISE PROGRAM: Yes - ex's as above and functional phrases for repetition, EMST   GOALS: Goals reviewed with patient? Yes  SHORT TERM GOALS: Target date: 10 sessions  Patient will demo HEP for dysarthria with min cues.  Baseline: Goal status: MET  2.  Patient will maintain adequate vocal quality and intensity (>70dB) in sentence level responses 90% accuracy.  Baseline:  Goal status: MET  3.  Patient will use dysarthria compensations in 2-3 sentence responses 90% accuracy with moderate cues.  Baseline:  Goal status: MET  4.  Patient will complete PROM. Baseline:  Goal status: MET   LONG TERM GOALS: Target date: 12 weeks  Patient will demo HEP for dysarthria independently.  Baseline:  Goal status: IN PROGRESS; occasional verbal cueing for hand placement for labial seal with EMST  2.  Patient will maintain intensity (avg >70dB) in 10 minutes conversation with modified independence  Baseline:  Goal status: IN PROGRESS  3.  Patient will use dysarthria compensations (slow, loud, overpronounce, pause) for intelligibility >90% in  10 minutes conversation.  Baseline:  Goal status: MET  4.  Patient will report improvement in PROM.  Baseline: VHI 73/120 (severe) Goal status: MET    ASSESSMENT:  CLINICAL IMPRESSION: Patient is a 72  y.o. male who was seen today for speech therapy session in setting of Parkinson's Disease. Patient presents with at least a moderate hypokinetic dysarthria characterized by reduced vocal intensity, reduced breath support, reduced pitch variability, intermittent rapid rate, and waxing/waning vocal quality (hypophonic, hoarse. Pt with difficulty maintaining adequate vocal intensity and quality and well as short rushes of speech which impact intelligibility. See details of tx above including marked improvement in acoustic measures since evaluation and subjective improvement on PROM. Pt has made good progress toward goals, thus far, and wishes to d/c tx at this time due to financial constraints. Pt considering resuming tx in the Spring.     OBJECTIVE IMPAIRMENTS include dysarthria. These impairments are limiting patient from effectively communicating at home and in community. Factors affecting potential to achieve goals and functional outcome are medical prognosis and financial resources. Patient will benefit from skilled SLP services to address above impairments and improve overall function.  REHAB POTENTIAL: Good  PLAN: D/C ST   Clyde Canterbury, M.S., CCC-SLP Speech-Language Pathologist [facility] [phone] Arnette Felts)   [facility] [address] [place], [place],  [address] Phone: [phone]   Fax:  [phone]

## 2023-02-25 NOTE — Therapy (Signed)
OUTPATIENT PHYSICAL THERAPY NEURO TREATMENT/DISCHARGE   Patient Name: Arthur Richardson MRN: [id number] DOB:[birth year], 72 y.o., male Today's Date: 02/25/2023   PCP: Dorcas Carrow, DO  REFERRING PROVIDER: Lonell Face, MD   END OF SESSION:  PT End of Session - 02/25/23 0919     Visit Number 10    Number of Visits 25    Date for PT Re-Evaluation 03/20/23    Progress Note Due on Visit 10    PT Start Time 0932    PT Stop Time 1015    PT Time Calculation (min) 43 min    Equipment Utilized During Treatment Gait belt    Activity Tolerance Patient tolerated treatment well    Behavior During Therapy WFL for tasks assessed/performed                  Past Medical History:  Diagnosis Date   Hyperlipidemia    Hypertension    Past Surgical History:  Procedure Laterality Date   LITHOTRIPSY  09/1998 & 07/29/1999   Due to nephrolithiasis   PROSTATE SURGERY     Biopsy= Negative   TONSILLECTOMY     Patient Active Problem List   Diagnosis Date Noted   B12 deficiency 12/19/2022   Primary Parkinson's disease (HCC) 07/01/2022   Senile purpura (HCC) 07/01/2022   Chronic cluster headache, not intractable 07/30/2021   Peripheral edema 07/30/2021   Dyspnea on exertion 06/08/2020   Trigger point of left shoulder region 08/21/2017   BPH with obstruction/lower urinary tract symptoms 02/20/2017   Advanced care planning/counseling discussion 02/20/2017   DDD (degenerative disc disease), lumbar 08/21/2016   Nephrolithiasis 02/01/2016   Barrett's esophagus with esophagitis 10/11/2014   Trigeminal neuralgia 10/11/2014   Hypertension 10/11/2014   Hyperlipidemia 10/11/2014   Hypothyroidism 10/11/2014   Dysphagia 08/04/2014    ONSET DATE: November 2023  REFERRING DIAG: 20.A1 (ICD-10-CM) - Parkinson's disease   THERAPY DIAG:  Other lack of coordination  Unsteadiness on feet  Difficulty in walking, not elsewhere classified  Muscle weakness  (generalized)  Rationale for Evaluation and Treatment: Rehabilitation  SUBJECTIVE:                                                                                                                                                                                             SUBJECTIVE STATEMENT: Pt reports today is last PT appointment due to insurance restrictions on visits until next year. He has continued pain felt into his groin, reports pain meds not helping much. PT encourages pt to follow-up with his physician regarding chronic pain. Pt verbalizes understanding.   Pt accompanied by: self  PERTINENT HISTORY:   The pt is a pleasant 72 yo male referred to PT for impairments due to Parkinson's. He ambulates to session using RW.  Pt's primary concerns include decreased strength and poor standing endurance. Pt also reports if he sits for too long he can have pain in BLE and hips and his legs can "lock up." Pt also with decreased endurance with ambulation, poor balance, and decreased UE/grip strength. Pt using RW today but uses a cane at home for shorter distances. Pt has difficulty with ADLs due to PD symptoms and his wife typically helps him. He has trouble with bed mobility, specifically rolling over, and difficulty with transfers. Pt avoids stairs. Pt reports freezing episodes. He says he was told to stop PD medications last neurology visit due to severe vertigo. He has follow-up with Dr. Sherryll Burger in December to try new PD medication. Pt's wife helps pt with ADLs. He is a taxidermist. He used to be active prior to onset of PD symptoms and would run a mile/day. Pt reports PT years ago for back pain. PMH significant for HTN, trigeminal neuralgia, barrett's esophagus with esophagitis, hypothyroidism, DDD, dysphagia, dyspnea on exertion, chronic cluster headache not intractable, peripheral edema, primary Parkinson's disease, B12 deficiency  PAIN:  Are you having pain? Yes: NPRS scale: not rated/10 Pain  location: hips, legs  Pain description:   Aggravating factors: sitting too long Relieving factors: standing up briefly  PRECAUTIONS: Fall  RED FLAGS: Bowel or bladder incontinence: Yes: bladder   Hx of CA: negative Hx of compression fx: negative AAA: negative   WEIGHT BEARING RESTRICTIONS: No  FALLS: Has patient fallen in last 6 months? No, not in the last six months but 2 falls a year ago states "when my leg gets to jumping."   LIVING ENVIRONMENT: Lives with: lives with their spouse and son Lives in: Other double wide Stairs: Yes: External: 5 steps; bilateral hand-rails Has following equipment at home: Single point cane and Walker - 2 wheeled  PLOF: Independent  PATIENT GOALS: Improve strength and decrease pain  OBJECTIVE:   DIAGNOSTIC FINDINGS:    Via chart  03/05/2022 NM BRAIN "FINDINGS: There is significant loss of activity within the entire RIGHT striatum. Relative maintained activity in the head of the LEFT caudate nucleus. Loss of activity in the LEFT putamen.   IMPRESSION: Mark asymmetric decreased radiotracer activity in the RIGHT striatum is a finding typical of Parkinsonian syndrome pathology.   Of note, DaTSCAN is not diagnostic of Parkinsonian syndromes, which remains a clinical diagnosis. DaTscan is an adjuvant test to aid in the clinical diagnosis of Parkinsonian syndromes.     Electronically Signed   By: Genevive Bi M.D.   On: 03/05/2022 14:41"  COGNITION: Overall cognitive status: Within functional limits for tasks assessed   SENSATION: WFL to light touch with testing UE/LE  Pt reports some tingling felt in his low back  COORDINATION: Tremor in bilat UE Some difficulty with rapid alt UE movement WFL chin<>target (pen) WFL rapid alt movement LE  EDEMA:  Pt reports no swelling  MUSCLE TONE: tremor bilat UE, observed rigidity with movement   POSTURE: rounded shoulders, forward head, and increased thoracic kyphosis   LOWER  EXTREMITY MMT:    Grossly 4/5 bilat LE, greatest deficits in hip mm, and hip flexors are pain-limited.    BED MOBILITY:  Imparied per pt report. Has difficulty rolling and getting out of his bed   TRANSFERS: Assistive device utilized: Environmental consultant - 2 wheeled  Sit to  stand: Modified independence Stand to sit: Modified independence Chair to chair: SBA    STAIRS: Reports he avoids stairs due to difficulty   GAIT: Gait pattern:  festinating gait, crouched posture Distance walked: , clinic distances Assistive device utilized: Walker - 2 wheeled Level of assistance: CGA Comments: with large-amplitude cuing for step-length pt able to improve during visit, significant difficulty with hypokinetic movement with turning, freezing   FUNCTIONAL TESTS:  5 times sit to stand: 31 sec with use of UE  : 0.67 m/s with RW  BERG 9/3: 26  TUG 9/3: 35.45sec  PATIENT SURVEYS:  FOTO 12 (goal 36)  TODAY'S TREATMENT:                                                                                                                              DATE: 02/25/23  Gait belt donned throughout to ensure pt safety  TA: Goal reassessment completed for discharge visit. Please refer to goal section and assessment below for details.  PT reviewed and updated/issued HEP for pt to maintain and continues gains after d/c. Pt completed one set of each of the following to confirm correct and safe technique during visit: Access Code: [email or web address] URL: [email or web address] Date: 02/25/2023 Prepared by: Temple Pacini  Exercises - Sit to Stand with Arm Swing  - 1-2 x daily - 5-7 x weekly - 3 sets - 5-10 reps - Walking  - 1 x daily - 7 x weekly - 3 sets - 1 reps - 60 seconds hold **written instructions to complete using his RW, and to focus on upright posture and big steps - Standing March with Counter Support  - 1 x daily - 5-6 x weekly - 2 sets - 10 reps - Standing Hip Abduction with Counter Support   - 1 x daily - 5-6 x weekly - 2 sets - 10 reps - Seated Long Arc Quad  - 1 x daily - 5-6 x weekly - 2 sets - 10 reps    PATIENT EDUCATION: Education details: Pt educated throughout session about proper posture and technique with exercises. Improved exercise technique, movement at target joints, use of target muscles after min to mod verbal, visual, tactile cues. D/c recommendations, HEP   Person educated: Patient Education method: Explanation, Demonstration, and Verbal cues Education comprehension: verbalized understanding, returned demonstration, verbal cues required, and needs further education  HOME EXERCISE PROGRAM:  Seated PWR! Moves added to program provided by Grier Rocher PT, DPT   SHORT TERM GOALS: Target date: 02/06/2023   Patient will be independent in home exercise program to improve strength/mobility for better functional independence with ADLs. Baseline: to be initiated; 10/29: updated for d/c, pt reports performing previous HEP frequently Goal status: MET   LONG TERM GOALS: Target date: 03/20/2023    Patient will increase FOTO score to equal to or greater than  36   to demonstrate statistically significant improvement in mobility and quality of life.  Baseline: 12; 10/29: 18  Goal status: IN PROGRESS  2.  Patient (> 24 years old) will complete five times sit to stand test in < 15 seconds indicating an increased LE strength and improved balance. Baseline: 31 sec use of BUE to push off chair; 10/29: 21 sec with BUE  Goal status: INITIAL  3.  Patient will increase Berg Balance score by > 6 points to demonstrate decreased fall risk during functional activities Baseline: 26; 10/29: unable to complete today due to time restrictions  Goal status: INITIAL  4.  Patient will increase 10 meter walk test to >1.10m/s as to improve gait speed for better community ambulation and to reduce fall risk. Baseline: 0.67 m/s with RW; 10/29: 0.75 m/s (pt holding up AD/not in contact  with ground) Goal status: IN PROGRESS  5.  Patient will reduce timed up and go to <11 seconds to reduce fall risk and demonstrate improved transfer/gait ability. Baseline: 35.45sec; 10/29: unable to complete today due to time restrictions Goal status: INITIAL    ASSESSMENT:  CLINICAL IMPRESSION:  Pt completed goal reassessment as today is last PT session due to insurance restrictions. Pt making gains on all testing completed: FOTO, 5xSTS, and , indicating improved perception of functional mobility, improved LE strength/power, gait speed and slight decrease in fall risk. HEP updated and reviewed during visit to ensure pt can continue to maintain and continue gains after discharge. Pt to be discharged on this date but would benefit from returning to PT in future.   OBJECTIVE IMPAIRMENTS: Abnormal gait, decreased activity tolerance, decreased balance, decreased coordination, decreased endurance, decreased mobility, difficulty walking, decreased ROM, decreased strength, impaired flexibility, impaired tone, impaired UE functional use, improper body mechanics, postural dysfunction, and pain.   ACTIVITY LIMITATIONS: carrying, lifting, bending, sitting, standing, squatting, stairs, transfers, bed mobility, continence, bathing, toileting, dressing, hygiene/grooming, and locomotion level  PARTICIPATION LIMITATIONS: meal prep, cleaning, laundry, driving, shopping, community activity, occupation, and yard work  PERSONAL FACTORS: Age and 3+ comorbidities: PMH significant for HTN, trigeminal neuralgia, barrett's esophagus with esophagitis, hypothyroidism, DDD, dysphagia, dyspnea on exertion, chronic cluster headache not intractable, peripheral edema, primary Parkinson's disease, B12 deficiency  are also affecting patient's functional outcome.   REHAB POTENTIAL: Good  CLINICAL DECISION MAKING: Evolving/moderate complexity  EVALUATION COMPLEXITY: Moderate  PLAN:  PT FREQUENCY: 2x/week  PT  DURATION: 12 weeks  PLANNED INTERVENTIONS: Therapeutic exercises, Therapeutic activity, Neuromuscular re-education, Balance training, Gait training, Patient/Family education, Self Care, Joint mobilization, Stair training, Vestibular training, Canalith repositioning, Visual/preceptual remediation/compensation, Orthotic/Fit training, DME instructions, Electrical stimulation, Spinal mobilization, Cryotherapy, Moist heat, Splintting, Taping, Manual therapy, and Re-evaluation  PLAN FOR NEXT SESSION:   D/c  Baird Kay, PT, DPT 02/25/2023, 4:35 PM

## 2023-02-27 ENCOUNTER — Ambulatory Visit: Payer: Medicare Other

## 2023-02-28 ENCOUNTER — Ambulatory Visit (INDEPENDENT_AMBULATORY_CARE_PROVIDER_SITE_OTHER): Payer: Medicare Other | Admitting: Family Medicine

## 2023-02-28 ENCOUNTER — Encounter: Payer: Self-pay | Admitting: Family Medicine

## 2023-02-28 VITALS — BP 131/77 | HR 56 | Ht 68.0 in | Wt 189.6 lb

## 2023-02-28 DIAGNOSIS — E7849 Other hyperlipidemia: Secondary | ICD-10-CM | POA: Diagnosis not present

## 2023-02-28 DIAGNOSIS — E538 Deficiency of other specified B group vitamins: Secondary | ICD-10-CM | POA: Diagnosis not present

## 2023-02-28 DIAGNOSIS — N138 Other obstructive and reflux uropathy: Secondary | ICD-10-CM

## 2023-02-28 DIAGNOSIS — R8281 Pyuria: Secondary | ICD-10-CM | POA: Diagnosis not present

## 2023-02-28 DIAGNOSIS — I10 Essential (primary) hypertension: Secondary | ICD-10-CM | POA: Diagnosis not present

## 2023-02-28 DIAGNOSIS — E039 Hypothyroidism, unspecified: Secondary | ICD-10-CM

## 2023-02-28 DIAGNOSIS — N401 Enlarged prostate with lower urinary tract symptoms: Secondary | ICD-10-CM

## 2023-02-28 LAB — MICROSCOPIC EXAMINATION

## 2023-02-28 LAB — URINALYSIS, ROUTINE W REFLEX MICROSCOPIC
Bilirubin, UA: NEGATIVE
Glucose, UA: NEGATIVE
Ketones, UA: NEGATIVE
Nitrite, UA: POSITIVE — AB
Protein,UA: NEGATIVE
Specific Gravity, UA: 1.02 (ref 1.005–1.030)
Urobilinogen, Ur: 1 mg/dL (ref 0.2–1.0)
pH, UA: 6 (ref 5.0–7.5)

## 2023-02-28 MED ORDER — CIPROFLOXACIN HCL 500 MG PO TABS: 500.0000 mg | ORAL_TABLET | Freq: Two times a day (BID) | ORAL | 0 refills | Status: DC

## 2023-02-28 MED ORDER — VERAPAMIL HCL ER 120 MG PO TBCR: 120.0000 mg | EXTENDED_RELEASE_TABLET | Freq: Every day | ORAL | 1 refills | Status: DC

## 2023-02-28 MED ORDER — TAMSULOSIN HCL 0.4 MG PO CAPS: 0.4000 mg | ORAL_CAPSULE | Freq: Every day | ORAL | 3 refills | Status: DC

## 2023-02-28 MED ORDER — DULOXETINE HCL 20 MG PO CPEP: 20.0000 mg | ORAL_CAPSULE | Freq: Every day | ORAL | 3 refills | Status: DC

## 2023-02-28 MED ORDER — FUROSEMIDE 40 MG PO TABS: 40.0000 mg | ORAL_TABLET | Freq: Every day | ORAL | 1 refills | Status: DC | PRN

## 2023-02-28 MED ORDER — LOSARTAN POTASSIUM 50 MG PO TABS: 50.0000 mg | ORAL_TABLET | Freq: Every day | ORAL | 0 refills | Status: DC

## 2023-02-28 NOTE — Progress Notes (Unsigned)
BP 131/77   Pulse (!) 56   Ht 5\' 8"  (1.727 m)   Wt 189 lb 9.6 oz (86 kg)   SpO2 99%   BMI 28.83 kg/m    Subjective:    Patient ID: Arthur Richardson, male    DOB: 1951/02/18, 72 y.o.   MRN: [id number]  HPI: Arthur Richardson is a 72 y.o. male  Chief Complaint  Patient presents with   Hypothyroidism   Hyperlipidemia   Hypertension   Benign Prostatic Hypertrophy   Tremors    Patient says the Omega XL does not help with his Parkinson's Disease and they did not reorder the medication.    Medication Management    Patient says he has stopped his Cholesterol medication and Omeprazole. Patient says if he has any issues he will drink some pickle juice.    Dysuria    Patient says he would like to have his urine checked, as he notices some pain with urination here and there.   HYPERTENSION / HYPERLIPIDEMIA Satisfied with current treatment? {Blank single:19197::"yes","no"} Duration of hypertension: {Blank single:19197::"chronic","months","years"} BP monitoring frequency: {Blank single:19197::"not checking","rarely","daily","weekly","monthly","a few times a day","a few times a week","a few times a month"} BP range:  BP medication side effects: {Blank single:19197::"yes","no"} Past BP meds: {Blank multiple:19196::"none","amlodipine","amlodipine/benazepril","atenolol","benazepril","benazepril/HCTZ","bisoprolol (bystolic)","carvedilol","chlorthalidone","clonidine","diltiazem","exforge HCT","HCTZ","irbesartan (avapro)","labetalol","lisinopril","lisinopril-HCTZ","losartan (cozaar)","methyldopa","nifedipine","olmesartan (benicar)","olmesartan-HCTZ","quinapril","ramipril","spironalactone","tekturna","valsartan","valsartan-HCTZ","verapamil"} Duration of hyperlipidemia: {Blank single:19197::"chronic","months","years"} Cholesterol medication side effects: {Blank single:19197::"yes","no"} Cholesterol supplements: {Blank multiple:19196::"none","fish oil","niacin","red yeast rice"} Past cholesterol  medications: {Blank multiple:19196::"none","atorvastain (lipitor)","lovastatin (mevacor)","pravastatin (pravachol)","rosuvastatin (crestor)","simvastatin (zocor)","vytorin","fenofibrate (tricor)","gemfibrozil","ezetimide (zetia)","niaspan","lovaza"} Medication compliance: {Blank single:19197::"excellent compliance","good compliance","fair compliance","poor compliance"} Aspirin: {Blank single:19197::"yes","no"} Recent stressors: {Blank single:19197::"yes","no"} Recurrent headaches: {Blank single:19197::"yes","no"} Visual changes: {Blank single:19197::"yes","no"} Palpitations: {Blank single:19197::"yes","no"} Dyspnea: {Blank single:19197::"yes","no"} Chest pain: {Blank single:19197::"yes","no"} Lower extremity edema: {Blank single:19197::"yes","no"} Dizzy/lightheaded: {Blank single:19197::"yes","no"}   Relevant past medical, surgical, family and social history reviewed and updated as indicated. Interim medical history since our last visit reviewed. Allergies and medications reviewed and updated.  Review of Systems  Per HPI unless specifically indicated above     Objective:    BP 131/77   Pulse (!) 56   Ht 5\' 8"  (1.727 m)   Wt 189 lb 9.6 oz (86 kg)   SpO2 99%   BMI 28.83 kg/m   Wt Readings from Last 3 Encounters:  02/28/23 189 lb 9.6 oz (86 kg)  10/28/22 194 lb (88 kg)  07/30/22 196 lb 12.8 oz (89.3 kg)    Physical Exam  Results for orders placed or performed in visit on 10/28/22  CBC with Differential/Platelet  Result Value Ref Range   WBC 7.5 3.4 - 10.8 x10E3/uL   RBC 4.33 4.14 - 5.80 x10E6/uL   Hemoglobin 13.5 13.0 - 17.7 g/dL   Hematocrit 95.6 38.7 - 51.0 %   MCV 96 79 - 97 fL   MCH 31.2 26.6 - 33.0 pg   MCHC 32.5 31.5 - 35.7 g/dL   RDW 56.4 33.2 - 95.1 %   Platelets 257 150 - 450 x10E3/uL   Neutrophils 55 Not Estab. %   Lymphs 35 Not Estab. %   Monocytes 7 Not Estab. %   Eos 2 Not Estab. %   Basos 1 Not Estab. %   Neutrophils Absolute 4.1 1.4 - 7.0 x10E3/uL    Lymphocytes Absolute 2.6 0.7 - 3.1 x10E3/uL   Monocytes Absolute 0.5 0.1 - 0.9 x10E3/uL   EOS (ABSOLUTE) 0.2 0.0 - 0.4 x10E3/uL   Basophils Absolute 0.1 0.0 - 0.2 x10E3/uL   Immature Granulocytes 0 Not Estab. %   Immature Grans (Abs) 0.0 0.0 -  0.1 x10E3/uL  Comprehensive metabolic panel  Result Value Ref Range   Glucose 89 70 - 99 mg/dL   BUN 14 8 - 27 mg/dL   Creatinine, Ser 1.61 0.76 - 1.27 mg/dL   eGFR 71 >09 UE/AVW/0.98   BUN/Creatinine Ratio 13 10 - 24   Sodium 139 134 - 144 mmol/L   Potassium 4.3 3.5 - 5.2 mmol/L   Chloride 101 96 - 106 mmol/L   CO2 24 20 - 29 mmol/L   Calcium 9.4 8.6 - 10.2 mg/dL   Total Protein 6.3 6.0 - 8.5 g/dL   Albumin 4.2 3.8 - 4.8 g/dL   Globulin, Total 2.1 1.5 - 4.5 g/dL   Bilirubin Total 0.5 0.0 - 1.2 mg/dL   Alkaline Phosphatase 135 (H) 44 - 121 IU/L   AST 14 0 - 40 IU/L   ALT 10 0 - 44 IU/L  Lipid Panel w/o Chol/HDL Ratio  Result Value Ref Range   Cholesterol, Total 120 100 - 199 mg/dL   Triglycerides 59 0 - 149 mg/dL   HDL 49 >11 mg/dL   VLDL Cholesterol Cal 13 5 - 40 mg/dL   LDL Chol Calc (NIH) 58 0 - 99 mg/dL  PSA  Result Value Ref Range   Prostate Specific Ag, Serum 6.8 (H) 0.0 - 4.0 ng/mL  TSH  Result Value Ref Range   TSH 0.951 0.450 - 4.500 uIU/mL      Assessment & Plan:   Problem List Items Addressed This Visit   None    Follow up plan: No follow-ups on file.

## 2023-03-01 LAB — COMPREHENSIVE METABOLIC PANEL
ALT: 15 [IU]/L (ref 0–44)
AST: 18 [IU]/L (ref 0–40)
Albumin: 4.3 g/dL (ref 3.8–4.8)
Alkaline Phosphatase: 162 [IU]/L — ABNORMAL HIGH (ref 44–121)
BUN/Creatinine Ratio: 13 (ref 10–24)
BUN: 13 mg/dL (ref 8–27)
Bilirubin Total: 0.5 mg/dL (ref 0.0–1.2)
CO2: 24 mmol/L (ref 20–29)
Calcium: 9.7 mg/dL (ref 8.6–10.2)
Chloride: 102 mmol/L (ref 96–106)
Creatinine, Ser: 0.98 mg/dL (ref 0.76–1.27)
Globulin, Total: 2.1 g/dL (ref 1.5–4.5)
Glucose: 85 mg/dL (ref 70–99)
Potassium: 4.1 mmol/L (ref 3.5–5.2)
Sodium: 139 mmol/L (ref 134–144)
Total Protein: 6.4 g/dL (ref 6.0–8.5)
eGFR: 81 mL/min/{1.73_m2} (ref >59)

## 2023-03-01 LAB — CBC WITH DIFFERENTIAL/PLATELET
Basophils Absolute: 0.1 10*3/uL (ref 0.0–0.2)
Basos: 1 %
EOS (ABSOLUTE): 0.3 10*3/uL (ref 0.0–0.4)
Eos: 5 %
Hematocrit: 44.1 % (ref 37.5–51.0)
Hemoglobin: 14 g/dL (ref 13.0–17.7)
Immature Grans (Abs): 0 10*3/uL (ref 0.0–0.1)
Immature Granulocytes: 0 %
Lymphocytes Absolute: 2.7 10*3/uL (ref 0.7–3.1)
Lymphs: 38 %
MCH: 29.9 pg (ref 26.6–33.0)
MCHC: 31.7 g/dL (ref 31.5–35.7)
MCV: 94 fL (ref 79–97)
Monocytes Absolute: 0.6 10*3/uL (ref 0.1–0.9)
Monocytes: 8 %
Neutrophils Absolute: 3.5 10*3/uL (ref 1.4–7.0)
Neutrophils: 48 %
Platelets: 258 10*3/uL (ref 150–450)
RBC: 4.69 x10E6/uL (ref 4.14–5.80)
RDW: 11.9 % (ref 11.6–15.4)
WBC: 7.2 10*3/uL (ref 3.4–10.8)

## 2023-03-01 LAB — TSH: TSH: 1.64 u[IU]/mL (ref 0.450–4.500)

## 2023-03-01 LAB — LIPID PANEL W/O CHOL/HDL RATIO
Cholesterol, Total: 198 mg/dL (ref 100–199)
HDL: 48 mg/dL (ref >39)
LDL Chol Calc (NIH): 135 mg/dL — ABNORMAL HIGH (ref 0–99)
Triglycerides: 82 mg/dL (ref 0–149)
VLDL Cholesterol Cal: 15 mg/dL (ref 5–40)

## 2023-03-01 LAB — VITAMIN B12: Vitamin B-12: 798 pg/mL (ref 232–1245)

## 2023-03-02 NOTE — Assessment & Plan Note (Signed)
Under good control on current regimen. Continue current regimen. Continue to monitor. Call with any concerns. Refills given. Labs drawn today.   

## 2023-03-02 NOTE — Assessment & Plan Note (Signed)
Rechecking labs today. Likely will need to restart his medicine. Call with any concerns.

## 2023-03-02 NOTE — Assessment & Plan Note (Signed)
Rechecking labs today. Await results. Treat as needed.  °

## 2023-03-03 LAB — URINE CULTURE

## 2023-03-04 ENCOUNTER — Ambulatory Visit: Payer: Medicare Other

## 2023-03-06 ENCOUNTER — Ambulatory Visit: Payer: Medicare Other

## 2023-03-07 ENCOUNTER — Ambulatory Visit: Payer: Medicare Other | Admitting: Physical Therapy

## 2023-03-07 ENCOUNTER — Other Ambulatory Visit: Payer: Self-pay | Admitting: Family Medicine

## 2023-03-07 DIAGNOSIS — E039 Hypothyroidism, unspecified: Secondary | ICD-10-CM

## 2023-03-07 MED ORDER — LEVOTHYROXINE SODIUM 50 MCG PO TABS: 50.0000 ug | ORAL_TABLET | Freq: Every day | ORAL | 3 refills | Status: DC

## 2023-03-11 ENCOUNTER — Ambulatory Visit: Payer: Medicare Other | Admitting: Physical Therapy

## 2023-03-12 ENCOUNTER — Ambulatory Visit (INDEPENDENT_AMBULATORY_CARE_PROVIDER_SITE_OTHER): Payer: Medicare Other

## 2023-03-12 DIAGNOSIS — E538 Deficiency of other specified B group vitamins: Secondary | ICD-10-CM | POA: Diagnosis not present

## 2023-03-13 ENCOUNTER — Ambulatory Visit: Payer: Medicare Other

## 2023-03-18 ENCOUNTER — Ambulatory Visit: Payer: Medicare Other | Admitting: Physical Therapy

## 2023-03-20 ENCOUNTER — Ambulatory Visit: Payer: Medicare Other

## 2023-03-25 ENCOUNTER — Ambulatory Visit: Payer: Medicare Other

## 2023-04-01 ENCOUNTER — Ambulatory Visit: Payer: Medicare Other

## 2023-04-08 ENCOUNTER — Ambulatory Visit: Payer: Medicare Other

## 2023-04-10 ENCOUNTER — Ambulatory Visit: Payer: Medicare Other

## 2023-04-15 ENCOUNTER — Ambulatory Visit: Payer: Medicare Other | Admitting: Family Medicine

## 2023-04-15 ENCOUNTER — Ambulatory Visit: Payer: Medicare Other

## 2023-04-15 DIAGNOSIS — G20C Parkinsonism, unspecified: Secondary | ICD-10-CM | POA: Diagnosis not present

## 2023-04-15 DIAGNOSIS — G4752 REM sleep behavior disorder: Secondary | ICD-10-CM | POA: Diagnosis not present

## 2023-04-15 DIAGNOSIS — M25551 Pain in right hip: Secondary | ICD-10-CM | POA: Diagnosis not present

## 2023-04-15 DIAGNOSIS — M25552 Pain in left hip: Secondary | ICD-10-CM | POA: Diagnosis not present

## 2023-04-16 ENCOUNTER — Encounter: Payer: Self-pay | Admitting: Family Medicine

## 2023-04-16 ENCOUNTER — Ambulatory Visit (INDEPENDENT_AMBULATORY_CARE_PROVIDER_SITE_OTHER): Payer: Medicare Other | Admitting: Family Medicine

## 2023-04-16 VITALS — BP 123/67 | HR 74 | Wt 190.0 lb

## 2023-04-16 DIAGNOSIS — E538 Deficiency of other specified B group vitamins: Secondary | ICD-10-CM

## 2023-04-16 DIAGNOSIS — G231 Progressive supranuclear ophthalmoplegia [Steele-Richardson-Olszewski]: Secondary | ICD-10-CM | POA: Insufficient documentation

## 2023-04-16 DIAGNOSIS — H02843 Edema of right eye, unspecified eyelid: Secondary | ICD-10-CM

## 2023-04-16 DIAGNOSIS — M51362 Other intervertebral disc degeneration, lumbar region with discogenic back pain and lower extremity pain: Secondary | ICD-10-CM | POA: Diagnosis not present

## 2023-04-16 MED ORDER — TRIAMCINOLONE ACETONIDE 40 MG/ML IJ SUSP
40.0000 mg | Freq: Once | INTRAMUSCULAR | Status: AC
  Administered 2023-04-16: 40 mg via INTRAMUSCULAR

## 2023-04-16 MED ORDER — DULOXETINE HCL 20 MG PO CPEP: 20.0000 mg | ORAL_CAPSULE | Freq: Every day | ORAL | 1 refills | Status: DC

## 2023-04-16 NOTE — Assessment & Plan Note (Signed)
Unsure if cymbalta is helping. Has been taking ibuprofen as well. Tolerating cymbalta well. No concerns. Refills given.

## 2023-04-16 NOTE — Progress Notes (Signed)
BP 123/67   Pulse 74   Wt 190 lb (86.2 kg)   SpO2 97%   BMI 28.89 kg/m    Subjective:    Patient ID: Arthur Richardson, male    DOB: Mar 03, 1951, 72 y.o.   MRN: [id number]  HPI: Arthur Richardson is a 72 y.o. male  Chief Complaint  Patient presents with   Leg Pain    Patient says he has leg discomfort that radiates down into his calf muscles.   BACK PAIN Duration: months Mechanism of injury: unknown Location: low back and L leg Onset: gradual Severity: moderate Quality: aching and shooting Frequency: constant Radiation: down L leg Aggravating factors: movement, laying Alleviating factors: ibuprofen, rest Status: stable Treatments attempted:  cymbalta, injections, rest, ice, heat, APAP, ibuprofen, aleve, and physical therapy  Relief with NSAIDs?: moderate Nighttime pain:  yes Paresthesias / decreased sensation:  yes Bowel / bladder incontinence:  no Fevers:  no Dysuria / urinary frequency:  no  Notes that his R eye was itching and red today. Just started today. No other concerns.   Relevant past medical, surgical, family and social history reviewed and updated as indicated. Interim medical history since our last visit reviewed. Allergies and medications reviewed and updated.  Review of Systems  Constitutional: Negative.   Respiratory: Negative.    Cardiovascular: Negative.   Musculoskeletal: Negative.   Skin: Negative.   Neurological:  Positive for tremors and light-headedness. Negative for dizziness, seizures, syncope, facial asymmetry, speech difficulty, weakness, numbness and headaches.  Psychiatric/Behavioral: Negative.      Per HPI unless specifically indicated above     Objective:    BP 123/67   Pulse 74   Wt 190 lb (86.2 kg)   SpO2 97%   BMI 28.89 kg/m   Wt Readings from Last 3 Encounters:  04/16/23 190 lb (86.2 kg)  02/28/23 189 lb 9.6 oz (86 kg)  10/28/22 194 lb (88 kg)    Physical Exam Vitals and nursing note reviewed.   Constitutional:      General: He is not in acute distress.    Appearance: Normal appearance. He is not ill-appearing, toxic-appearing or diaphoretic.  HENT:     Head: Normocephalic and atraumatic.     Right Ear: Tympanic membrane, ear canal and external ear normal.     Left Ear: Tympanic membrane, ear canal and external ear normal.     Nose: Nose normal. No congestion or rhinorrhea.     Mouth/Throat:     Mouth: Mucous membranes are moist.     Pharynx: Oropharynx is clear. No oropharyngeal exudate or posterior oropharyngeal erythema.  Eyes:     General: No scleral icterus.       Right eye: No discharge.        Left eye: No discharge.     Extraocular Movements: Extraocular movements intact.     Conjunctiva/sclera: Conjunctivae normal.     Pupils: Pupils are equal, round, and reactive to light.     Comments: Swelling of R upper and lower eyelid  Cardiovascular:     Rate and Rhythm: Normal rate and regular rhythm.     Pulses: Normal pulses.     Heart sounds: Normal heart sounds. No murmur heard.    No friction rub. No gallop.  Pulmonary:     Effort: Pulmonary effort is normal. No respiratory distress.     Breath sounds: Normal breath sounds. No stridor. No wheezing, rhonchi or rales.  Chest:     Chest wall: No  tenderness.  Musculoskeletal:        General: Normal range of motion.     Cervical back: Normal range of motion and neck supple.  Skin:    General: Skin is warm and dry.     Capillary Refill: Capillary refill takes less than 2 seconds.     Coloration: Skin is not jaundiced or pale.     Findings: No bruising, erythema, lesion or rash.  Neurological:     General: No focal deficit present.     Mental Status: He is alert and oriented to person, place, and time. Mental status is at baseline.  Psychiatric:        Mood and Affect: Mood normal.        Behavior: Behavior normal.        Thought Content: Thought content normal.        Judgment: Judgment normal.     Results  for orders placed or performed in visit on 02/28/23  Microscopic Examination   Collection Time: 02/28/23  9:30 AM   Urine  Result Value Ref Range   WBC, UA >30W 0 - 5 /hpf   RBC, Urine 0-2 0 - 2 /hpf   Epithelial Cells (non renal) 0-10 0 - 10 /hpf   Bacteria, UA Many (A) None seen/Few  Urinalysis, Routine w reflex microscopic   Collection Time: 02/28/23  9:30 AM  Result Value Ref Range   Specific Gravity, UA 1.020 1.005 - 1.030   pH, UA 6.0 5.0 - 7.5   Color, UA Yellow Yellow   Appearance Ur Cloudy (A) Clear   Leukocytes,UA 2+ (A) Negative   Protein,UA Negative Negative/Trace   Glucose, UA Negative Negative   Ketones, UA Negative Negative   RBC, UA Trace (A) Negative   Bilirubin, UA Negative Negative   Urobilinogen, Ur 1.0 0.2 - 1.0 mg/dL   Nitrite, UA Positive (A) Negative   Microscopic Examination See below:   CBC with Differential/Platelet   Collection Time: 02/28/23  9:36 AM  Result Value Ref Range   WBC 7.2 3.4 - 10.8 x10E3/uL   RBC 4.69 4.14 - 5.80 x10E6/uL   Hemoglobin 14.0 13.0 - 17.7 g/dL   Hematocrit 82.9 56.2 - 51.0 %   MCV 94 79 - 97 fL   MCH 29.9 26.6 - 33.0 pg   MCHC 31.7 31.5 - 35.7 g/dL   RDW 13.0 86.5 - 78.4 %   Platelets 258 150 - 450 x10E3/uL   Neutrophils 48 Not Estab. %   Lymphs 38 Not Estab. %   Monocytes 8 Not Estab. %   Eos 5 Not Estab. %   Basos 1 Not Estab. %   Neutrophils Absolute 3.5 1.4 - 7.0 x10E3/uL   Lymphocytes Absolute 2.7 0.7 - 3.1 x10E3/uL   Monocytes Absolute 0.6 0.1 - 0.9 x10E3/uL   EOS (ABSOLUTE) 0.3 0.0 - 0.4 x10E3/uL   Basophils Absolute 0.1 0.0 - 0.2 x10E3/uL   Immature Granulocytes 0 Not Estab. %   Immature Grans (Abs) 0.0 0.0 - 0.1 x10E3/uL  Comprehensive metabolic panel   Collection Time: 02/28/23  9:36 AM  Result Value Ref Range   Glucose 85 70 - 99 mg/dL   BUN 13 8 - 27 mg/dL   Creatinine, Ser 6.96 0.76 - 1.27 mg/dL   eGFR 81 >29 BM/WUX/3.24   BUN/Creatinine Ratio 13 10 - 24   Sodium 139 134 - 144 mmol/L    Potassium 4.1 3.5 - 5.2 mmol/L   Chloride 102 96 - 106 mmol/L  CO2 24 20 - 29 mmol/L   Calcium 9.7 8.6 - 10.2 mg/dL   Total Protein 6.4 6.0 - 8.5 g/dL   Albumin 4.3 3.8 - 4.8 g/dL   Globulin, Total 2.1 1.5 - 4.5 g/dL   Bilirubin Total 0.5 0.0 - 1.2 mg/dL   Alkaline Phosphatase 162 (H) 44 - 121 IU/L   AST 18 0 - 40 IU/L   ALT 15 0 - 44 IU/L  Lipid Panel w/o Chol/HDL Ratio   Collection Time: 02/28/23  9:36 AM  Result Value Ref Range   Cholesterol, Total 198 100 - 199 mg/dL   Triglycerides 82 0 - 149 mg/dL   HDL 48 >16 mg/dL   VLDL Cholesterol Cal 15 5 - 40 mg/dL   LDL Chol Calc (NIH) 109 (H) 0 - 99 mg/dL  U04   Collection Time: 02/28/23  9:36 AM  Result Value Ref Range   Vitamin B-12 798 232 - 1,245 pg/mL  TSH   Collection Time: 02/28/23  9:36 AM  Result Value Ref Range   TSH 1.640 0.450 - 4.500 uIU/mL  Urine Culture   Collection Time: 02/28/23  4:33 PM   Specimen: Urine   UR  Result Value Ref Range   Urine Culture, Routine Final report (A)    Organism ID, Bacteria Escherichia coli (A)    ORGANISM ID, BACTERIA Comment    Antimicrobial Susceptibility Comment       Assessment & Plan:   Problem List Items Addressed This Visit       Nervous and Auditory   PSP (progressive supranuclear palsy) (HCC) - Primary     Musculoskeletal and Integument   DDD (degenerative disc disease), lumbar   Unsure if cymbalta is helping. Has been taking ibuprofen as well. Tolerating cymbalta well. No concerns. Refills given.       Other Visit Diagnoses       Swollen eyelid, right       Will treat with triamcinalone and zyrtec. Call if not getting better or getting worse.   Relevant Medications   triamcinolone acetonide (KENALOG-40) injection 40 mg (Completed)        Follow up plan: Return in about 3 months (around 07/15/2023).

## 2023-04-17 ENCOUNTER — Ambulatory Visit: Payer: Medicare Other

## 2023-04-22 ENCOUNTER — Ambulatory Visit: Payer: Medicare Other

## 2023-04-29 ENCOUNTER — Ambulatory Visit: Payer: Medicare Other

## 2023-05-01 ENCOUNTER — Ambulatory Visit: Payer: Medicare Other

## 2023-05-06 ENCOUNTER — Ambulatory Visit: Payer: Medicare Other

## 2023-05-08 ENCOUNTER — Ambulatory Visit: Payer: Medicare Other

## 2023-05-13 ENCOUNTER — Ambulatory Visit: Payer: Medicare Other

## 2023-05-15 ENCOUNTER — Ambulatory Visit: Payer: Medicare Other

## 2023-05-20 ENCOUNTER — Ambulatory Visit: Payer: Medicare Other

## 2023-05-22 ENCOUNTER — Ambulatory Visit: Payer: Medicare Other

## 2023-05-27 ENCOUNTER — Ambulatory Visit: Payer: Medicare Other

## 2023-05-29 ENCOUNTER — Ambulatory Visit: Payer: Medicare Other

## 2023-06-03 ENCOUNTER — Ambulatory Visit: Payer: Medicare Other

## 2023-06-05 ENCOUNTER — Ambulatory Visit: Payer: Medicare Other

## 2023-06-10 ENCOUNTER — Ambulatory Visit: Payer: Medicare Other

## 2023-06-12 ENCOUNTER — Ambulatory Visit: Payer: Medicare Other

## 2023-06-13 ENCOUNTER — Other Ambulatory Visit: Payer: Self-pay

## 2023-06-17 ENCOUNTER — Ambulatory Visit: Payer: Medicare Other

## 2023-06-17 VITALS — Ht 68.0 in | Wt 190.0 lb

## 2023-06-17 DIAGNOSIS — Z0000 Encounter for general adult medical examination without abnormal findings: Secondary | ICD-10-CM | POA: Diagnosis not present

## 2023-06-17 NOTE — Patient Instructions (Addendum)
[name] , Thank you for taking time to come for your Medicare Wellness Visit. I appreciate your ongoing commitment to your health goals. Please review the following plan we discussed and let me know if I can assist you in the future.   Referrals/Orders/Follow-Ups/Clinician Recommendations: none. Keep up the good work!  This is a list of the screening recommended for you and due dates:  Health Maintenance  Topic Date Due   Zoster (Shingles) Vaccine (1 of 2) Never done   COVID-19 Vaccine (1 - 2024-25 season) Never done   Medicare Annual Wellness Visit  06/16/2024   Colon Cancer Screening  03/19/2027   DTaP/Tdap/Td vaccine (3 - Td or Tdap) 05/25/2031   Pneumonia Vaccine  Completed   Flu Shot  Completed   Hepatitis C Screening  Completed   HPV Vaccine  Aged Out    Advanced directives: (Declined) Advance directive discussed with you today. Even though you declined this today, please call our office should you change your mind, and we can give you the proper paperwork for you to fill out.  Next Medicare Annual Wellness Visit scheduled for next year: Yes, 06/22/24 @ 3:10pm (phone visit)  Fall Prevention in the Home, Adult Falls can cause injuries and affect people of all ages. There are many simple things that you can do to make your home safe and to help prevent falls. If you need it, ask for help making these changes. What actions can I take to prevent falls? General information Use good lighting in all rooms. Make sure to: Replace any light bulbs that burn out. Turn on lights if it is dark and use night-lights. Keep items that you use often in easy-to-reach places. Lower the shelves around your home if needed. Move furniture so that there are clear paths around it. Do not keep throw rugs or other things on the floor that can make you trip. If any of your floors are uneven, fix them. Add color or contrast paint or tape to clearly mark and help you see: Grab bars or handrails. First  and last steps of staircases. Where the edge of each step is. If you use a ladder or stepladder: Make sure that it is fully opened. Do not climb a closed ladder. Make sure the sides of the ladder are locked in place. Have someone hold the ladder while you use it. Know where your pets are as you move through your home. What can I do in the bathroom?     Keep the floor dry. Clean up any water that is on the floor right away. Remove soap buildup in the bathtub or shower. Buildup makes bathtubs and showers slippery. Use non-skid mats or decals on the floor of the bathtub or shower. Attach bath mats securely with double-sided, non-slip rug tape. If you need to sit down while you are in the shower, use a non-slip stool. Install grab bars by the toilet and in the bathtub and shower. Do not use towel bars as grab bars. What can I do in the bedroom? Make sure that you have a light by your bed that is easy to reach. Do not use any sheets or blankets on your bed that hang to the floor. Have a firm bench or chair with side arms that you can use for support when you get dressed. What can I do in the kitchen? Clean up any spills right away. If you need to reach something above you, use a sturdy step stool that has  a grab bar. Keep electrical cables out of the way. Do not use floor polish or wax that makes floors slippery. What can I do with my stairs? Do not leave anything on the stairs. Make sure that you have a light switch at the top and the bottom of the stairs. Have them installed if you do not have them. Make sure that there are handrails on both sides of the stairs. Fix handrails that are broken or loose. Make sure that handrails are as long as the staircases. Install non-slip stair treads on all stairs in your home if they do not have carpet. Avoid having throw rugs at the top or bottom of stairs, or secure the rugs with carpet tape to prevent them from moving. Choose a carpet design that  does not hide the edge of steps on the stairs. Make sure that carpet is firmly attached to the stairs. Fix any carpet that is loose or worn. What can I do on the outside of my home? Use bright outdoor lighting. Repair the edges of walkways and driveways and fix any cracks. Clear paths of anything that can make you trip, such as tools or rocks. Add color or contrast paint or tape to clearly mark and help you see high doorway thresholds. Trim any bushes or trees on the main path into your home. Check that handrails are securely fastened and in good repair. Both sides of all steps should have handrails. Install guardrails along the edges of any raised decks or porches. Have leaves, snow, and ice cleared regularly. Use sand, salt, or ice melt on walkways during winter months if you live where there is ice and snow. In the garage, clean up any spills right away, including grease or oil spills. What other actions can I take? Review your medicines with your health care provider. Some medicines can make you confused or feel dizzy. This can increase your chance of falling. Wear closed-toe shoes that fit well and support your feet. Wear shoes that have rubber soles and low heels. Use a cane, walker, scooter, or crutches that help you move around if needed. Talk with your provider about other ways that you can decrease your risk of falls. This may include seeing a physical therapist to learn to do exercises to improve movement and strength. Where to find more information Centers for Disease Control and Prevention, [employer]: TonerPromos.no [employer] on Aging: BaseRingTones.pl National Institute on Aging: BaseRingTones.pl Contact a health care provider if: You are afraid of falling at home. You feel weak, drowsy, or dizzy at home. You fall at home. Get help right away if you: Lose consciousness or have trouble moving after a fall. Have a fall that causes a head injury. These symptoms may be an emergency. Get help  right away. Call 911. Do not wait to see if the symptoms will go away. Do not drive yourself to the hospital. This information is not intended to replace advice given to you by your health care provider. Make sure you discuss any questions you have with your health care provider. Document Revised: 12/17/2021 Document Reviewed: 12/17/2021 Elsevier Patient Education  2024 [employer].

## 2023-06-17 NOTE — Progress Notes (Signed)
Subjective:   Arthur Richardson is a 73 y.o. male who presents for Medicare Annual/Subsequent preventive examination.  This patient declined Interactive audio and Acupuncturist. Therefore the visit was completed with audio only.   Visit Complete: Virtual I connected with  Sarita Bottom on 06/17/23 by a audio enabled telemedicine application and verified that I am speaking with the correct person using two identifiers.  Patient Location: Home  Provider Location: Office/Clinic  I discussed the limitations of evaluation and management by telemedicine. The patient expressed understanding and agreed to proceed.  Vital Signs: Because this visit was a virtual/telehealth visit, some criteria may be missing or patient reported. Any vitals not documented were not able to be obtained and vitals that have been documented are patient reported.   Cardiac Risk Factors include: advanced age (>15men, >64 women);male gender;hypertension;dyslipidemia     Objective:    Today's Vitals   06/17/23 1509  Weight: 190 lb (86.2 kg)  Height: 5\' 8"  (1.727 m)   Body mass index is 28.89 kg/m.     06/17/2023    3:26 PM 06/11/2022    9:24 AM 04/09/2021   11:23 AM 04/07/2020    1:48 PM  Advanced Directives  Does Patient Have a Medical Advance Directive? No No Yes No  Type of Advance Directive   Living will   Would patient like information on creating a medical advance directive? No - Patient declined No - Patient declined      Current Medications (verified) Outpatient Encounter Medications as of 06/17/2023  Medication Sig   acetaminophen (TYLENOL) 500 MG tablet Take 500 mg by mouth every 6 (six) hours as needed.   aspirin EC 81 MG tablet Take 81 mg by mouth daily.   DULoxetine (CYMBALTA) 20 MG capsule Take 1 capsule (20 mg total) by mouth at bedtime.   furosemide (LASIX) 40 MG tablet Take 1 tablet (40 mg total) by mouth daily as needed.   ibuprofen (ADVIL) 200 MG tablet Take 800 mg  by mouth every 6 (six) hours as needed.   levothyroxine (SYNTHROID) 50 MCG tablet Take 1 tablet (50 mcg total) by mouth daily before breakfast.   losartan (COZAAR) 50 MG tablet Take 1 tablet (50 mg total) by mouth daily.   tamsulosin (FLOMAX) 0.4 MG CAPS capsule Take 1 capsule (0.4 mg total) by mouth daily.   verapamil (CALAN-SR) 120 MG CR tablet Take 1 tablet (120 mg total) by mouth at bedtime.   carbidopa-levodopa (SINEMET IR) 25-100 MG tablet Take 1 tablet by mouth 3 (three) times daily. Follow instructions provided separately in writing. (Patient not taking: Reported on 06/17/2023)   rOPINIRole (REQUIP) 0.25 MG tablet Take by mouth. (Patient not taking: Reported on 06/17/2023)   Facility-Administered Encounter Medications as of 06/17/2023  Medication   cyanocobalamin (VITAMIN B12) injection 1,000 mcg    Allergies (verified) Penicillins   History: Past Medical History:  Diagnosis Date   Hyperlipidemia    Hypertension    Past Surgical History:  Procedure Laterality Date   LITHOTRIPSY  09/1998 & 07/29/1999   Due to nephrolithiasis   PROSTATE SURGERY     Biopsy= Negative   TONSILLECTOMY     Family History  Problem Relation Age of Onset   Diabetes Mother    Stroke Father    Cancer Sister        Pancreatic Cancer   Parkinson's disease Neg Hx    Tremor Neg Hx    Social History   Socioeconomic History   Marital  status: Married    Spouse name: [name]   Number of children: 2   Years of education: Not on file   Highest education level: Not on file  Occupational History   Not on file  Tobacco Use   Smoking status: Former    Current packs/day: 0.00    Average packs/day: 1 pack/day for 13.8 years (13.8 ttl pk-yrs)    Types: Cigarettes    Start date: 31    Quit date: 01/31/1980    Years since quitting: 43.4    Passive exposure: Past   Smokeless tobacco: Current    Types: Chew  Vaping Use   Vaping status: Never Used  Substance and Sexual Activity   Alcohol use: Yes     Alcohol/week: 14.0 standard drinks of alcohol    Types: 14 Cans of beer per week    Comment: 06/17/23 1 beer every 2 weeks   Drug use: No   Sexual activity: Yes  Other Topics Concern   Not on file  Social History Narrative   Not on file   Social Drivers of Health   Financial Resource Strain: Low Risk  (06/17/2023)   Overall Financial Resource Strain (CARDIA)    Difficulty of Paying Living Expenses: Not hard at all  Food Insecurity: No Food Insecurity (06/17/2023)   Hunger Vital Sign    Worried About Running Out of Food in the Last Year: Never true    Ran Out of Food in the Last Year: Never true  Transportation Needs: No Transportation Needs (06/17/2023)   PRAPARE - Administrator, Civil Service (Medical): No    Lack of Transportation (Non-Medical): No  Physical Activity: Sufficiently Active (06/17/2023)   Exercise Vital Sign    Days of Exercise per Week: 7 days    Minutes of Exercise per Session: 60 min  Stress: No Stress Concern Present (06/17/2023)   [employer] of Occupational Health - Occupational Stress Questionnaire    Feeling of Stress : Not at all  Social Connections: Moderately Integrated (06/17/2023)   Social Connection and Isolation Panel [NHANES]    Frequency of Communication with Friends and Family: Twice a week    Frequency of Social Gatherings with Friends and Family: More than three times a week    Attends Religious Services: More than 4 times per year    Active Member of [employer] or Organizations: No    Attends Engineer, structural: Never    Marital Status: Married    Tobacco Counseling Ready to quit: Not Answered Counseling given: Not Answered   Clinical Intake:  Pre-visit preparation completed: Yes  Pain : No/denies pain     BMI - recorded: 28.89 Nutritional Status: BMI 25 -29 Overweight Nutritional Risks: None Diabetes: No  How often do you need to have someone help you when you read instructions, pamphlets, or other  written materials from your doctor or pharmacy?: 1 - Never  Interpreter Needed?: No  Information entered by :: Tora Kindred, CMA   Activities of Daily Living    06/17/2023    3:12 PM  In your present state of health, do you have any difficulty performing the following activities:  Hearing? 0  Vision? 0  Difficulty concentrating or making decisions? 0  Walking or climbing stairs? 1  Comment walker  Dressing or bathing? 1  Comment wife helps dress  Doing errands, shopping? 1  Comment wife goes to appointments  Preparing Food and eating ? N  Using the Toilet? N  In  the past six months, have you accidently leaked urine? N  Do you have problems with loss of bowel control? N  Managing your Medications? Y  Comment wife fills pills box  Managing your Finances? N  Housekeeping or managing your Housekeeping? Y  Comment wife cooks and cleans    Patient Care Team: Dorcas Carrow, DO as PCP - General (Family Medicine) Debbe Odea, MD as Consulting Physician (Cardiology) Debbe Odea, MD as Consulting Physician (Cardiology) Pa, [facility] (Optometry)  Indicate any recent Medical Services you may have received from other than [facility] providers in the past year (date may be approximate).     Assessment:   This is a routine wellness examination for [facility].  Hearing/Vision screen Hearing Screening - Comments:: Denies hearing loss Vision Screening - Comments:: Gets eye exam, [facility]   Goals Addressed               This Visit's Progress     Patient Stated (pt-stated)        Get legs in shape      Depression Screen    06/17/2023    3:24 PM 04/16/2023    9:59 AM 10/28/2022    8:24 AM 07/29/2022    9:26 AM 07/01/2022    9:11 AM 06/11/2022    9:22 AM 04/01/2022    2:28 PM  PHQ 2/9 Scores  PHQ - 2 Score 1 0 0 0 3 0 0  PHQ- 9 Score  4 0 6 8 0 7    Fall Risk    06/17/2023    3:27 PM 04/16/2023    9:59 AM 10/28/2022    8:24 AM 07/29/2022     9:26 AM 07/01/2022    9:10 AM  Fall Risk   Falls in the past year? 0 0 0 0 0  Number falls in past yr: 0 0 0 0 0  Injury with Fall? 0 0 0 0 0  Risk for fall due to : Impaired balance/gait;Orthopedic patient;Impaired mobility;Other (Comment) No Fall Risks No Fall Risks No Fall Risks No Fall Risks  Risk for fall due to: Comment Parkinson's disease      Follow up Falls prevention discussed;Falls evaluation completed;Education provided Falls evaluation completed Falls evaluation completed Falls evaluation completed Falls evaluation completed    MEDICARE RISK AT HOME: Medicare Risk at Home Any stairs in or around the home?: Yes If so, are there any without handrails?: No Home free of loose throw rugs in walkways, pet beds, electrical cords, etc?: Yes Adequate lighting in your home to reduce risk of falls?: Yes Life alert?: No Use of a cane, walker or w/c?: Yes (walker) Grab bars in the bathroom?: Yes Shower chair or bench in shower?: Yes Elevated toilet seat or a handicapped toilet?: No  TIMED UP AND GO:  Was the test performed?  No    Cognitive Function:        06/17/2023    3:29 PM 06/11/2022    9:26 AM 04/07/2020    1:51 PM 04/06/2019    8:25 AM  6CIT Screen  What Year? 0 points 0 points 0 points 0 points  What month? 0 points 0 points 0 points 0 points  What time? 0 points 0 points 0 points 0 points  Count back from 20 0 points 0 points 0 points 0 points  Months in reverse 0 points 4 points 0 points 0 points  Repeat phrase 6 points 4 points 6 points 2 points  Total Score 6 points 8 points 6 points 2 points    Immunizations Immunization History  Administered Date(s) Administered   Fluad Quad(high Dose 65+) 01/11/2019, 01/24/2020, 01/22/2021, 01/22/2022   Fluad Trivalent(High Dose 65+) 02/10/2023   Influenza, High Dose Seasonal PF 02/01/2016, 02/02/2018   Influenza,inj,Quad PF,6+ Mos 01/31/2015   Pneumococcal Conjugate-13 08/31/2014   Pneumococcal Polysaccharide-23  04/06/2019   Td 05/24/2021   Tdap 12/03/2010    TDAP status: Up to date  Flu Vaccine status: Up to date  Pneumococcal vaccine status: Up to date  Covid-19 vaccine status: Declined, Education has been provided regarding the importance of this vaccine but patient still declined. Advised may receive this vaccine at local pharmacy or Health Dept.or vaccine clinic. Aware to provide a copy of the vaccination record if obtained from local pharmacy or Health Dept. Verbalized acceptance and understanding.  Qualifies for Shingles Vaccine? Yes   Zostavax completed No   Shingrix Completed?: No.    Education has been provided regarding the importance of this vaccine. Patient has been advised to call insurance company to determine out of pocket expense if they have not yet received this vaccine. Advised may also receive vaccine at local pharmacy or Health Dept. Verbalized acceptance and understanding. Patient declined  Screening Tests Health Maintenance  Topic Date Due   Zoster Vaccines- Shingrix (1 of 2) Never done   COVID-19 Vaccine (1 - 2024-25 season) Never done   Medicare Annual Wellness (AWV)  06/16/2024   Colonoscopy  03/19/2027   DTaP/Tdap/Td (3 - Td or Tdap) 05/25/2031   Pneumonia Vaccine 83+ Years old  Completed   INFLUENZA VACCINE  Completed   Hepatitis C Screening  Completed   HPV VACCINES  Aged Out    Health Maintenance  Health Maintenance Due  Topic Date Due   Zoster Vaccines- Shingrix (1 of 2) Never done   COVID-19 Vaccine (1 - 2024-25 season) Never done    Colorectal cancer screening: Type of screening: Colonoscopy. Completed 03/18/17. Repeat every 10 years  Lung Cancer Screening: (Low Dose CT Chest recommended if Age 30-80 years, 20 pack-year currently smoking OR have quit w/in 15years.) does not qualify.   Lung Cancer Screening Referral: n/a  Additional Screening:  Hepatitis C Screening: does not qualify; Completed 08/07/15  Vision Screening: Recommended annual  ophthalmology exams for early detection of glaucoma and other disorders of the eye.  Dental Screening: Recommended annual dental exams for proper oral hygiene   Community Resource Referral / Chronic Care Management: CRR required this visit?  No   CCM required this visit?  No     Plan:     I have personally reviewed and noted the following in the patient's chart:   Medical and social history Use of alcohol, tobacco or illicit drugs  Current medications and supplements including opioid prescriptions. Patient is not currently taking opioid prescriptions. Functional ability and status Nutritional status Physical activity Advanced directives List of other physicians Hospitalizations, surgeries, and ER visits in previous 12 months Vitals Screenings to include cognitive, depression, and falls Referrals and appointments  In addition, I have reviewed and discussed with patient certain preventive protocols, quality metrics, and best practice recommendations. A written personalized care plan for preventive services as well as general preventive health recommendations were provided to patient.     Tora Kindred, CMA   06/17/2023   After Visit Summary: (Declined) Due to this being a telephonic visit, with patients personalized plan was offered to patient but patient Declined AVS at this time  Nurse Notes:  6 CIT Score - 6 Declined covid and shingles vaccines

## 2023-06-18 ENCOUNTER — Other Ambulatory Visit: Payer: Medicare Other

## 2023-06-19 ENCOUNTER — Ambulatory Visit: Payer: Self-pay | Admitting: Urology

## 2023-06-19 ENCOUNTER — Ambulatory Visit: Payer: Medicare Other

## 2023-06-24 ENCOUNTER — Other Ambulatory Visit: Payer: Self-pay

## 2023-06-24 ENCOUNTER — Other Ambulatory Visit: Payer: Medicare Other

## 2023-06-24 ENCOUNTER — Ambulatory Visit: Payer: Medicare Other | Admitting: Urology

## 2023-06-24 DIAGNOSIS — Z125 Encounter for screening for malignant neoplasm of prostate: Secondary | ICD-10-CM

## 2023-06-24 DIAGNOSIS — N401 Enlarged prostate with lower urinary tract symptoms: Secondary | ICD-10-CM

## 2023-06-25 LAB — PSA: Prostate Specific Ag, Serum: 4.4 ng/mL — ABNORMAL HIGH (ref 0.0–4.0)

## 2023-06-30 NOTE — Progress Notes (Unsigned)
 07/01/2023 9:46 AM   Sarita Bottom May 11, 1950 [id number]  Referring provider: Dorcas Carrow, DO [address] [place],  [place] [address]  Urological history: 1. BPH with LU TS -PSA (05/2023) 4.4 -tamsulosin 0.4 mg daily   2. OAB -Contributing factors of age, Parkinson's, BPH, history of smoking, alcohol consumption, hypertension and lumbar DDD -Myrbetriq 50 mg daily - cost prohibitive   3.  Nephrolithiasis -remote history   Chief Complaint  Patient presents with   Follow-up   HPI: Arthur Richardson is a 73 y.o. male who presents today for one year follow up with his wife, Lynnell Dike.   Previous records reviewed.   He saw his primary care physician in November 2024 he had a complaint of dysuria.  He had an E.coli infection and was treated with culture appropriate antibiotics.  I PSS 2/4  PVR 84 mL   He is very bothered by the fact that he has to wear depends.  He states they are mostly dry during the day, but then he sleeps during the night and wakes up with the depends soaked and also has to put down Chux at night.  He was given Myrbetriq samples at his last appointment, but he cannot remember if it helped with his urinary incontinence.  He is taking tamsulosin 0.4 mg daily.   IPSS     Row Name 07/01/23 0900         International Prostate Symptom Score   How often have you had the sensation of not emptying your bladder? Not at All     How often have you had to urinate less than every two hours? Not at All     How often have you found you stopped and started again several times when you urinated? Not at All     How often have you found it difficult to postpone urination? Not at All     How often have you had a weak urinary stream? Not at All     How often have you had to strain to start urination? Not at All     How many times did you typically get up at night to urinate? 2 Times     Total IPSS Score 2       Quality of Life due to urinary symptoms   If you were  to spend the rest of your life with your urinary condition just the way it is now how would you feel about that? Mostly Disatisfied              Score:  1-7 Mild 8-19 Moderate 20-35 Severe   PMH: Past Medical History:  Diagnosis Date   Hyperlipidemia    Hypertension     Surgical History: Past Surgical History:  Procedure Laterality Date   LITHOTRIPSY  09/1998 & 07/29/1999   Due to nephrolithiasis   PROSTATE SURGERY     Biopsy= Negative   TONSILLECTOMY      Home Medications:  Allergies as of 07/01/2023       Reactions   Penicillins         Medication List        Accurate as of July 01, 2023  9:46 AM. If you have any questions, ask your nurse or doctor.          STOP taking these medications    carbidopa-levodopa 25-100 MG tablet Commonly known as: SINEMET IR Stopped by: Jevaughn Degollado   rOPINIRole 0.25 MG tablet Commonly known  as: REQUIP Stopped by: Michiel Cowboy       TAKE these medications    acetaminophen 500 MG tablet Commonly known as: TYLENOL Take 500 mg by mouth every 6 (six) hours as needed.   aspirin EC 81 MG tablet Take 81 mg by mouth daily.   DULoxetine 20 MG capsule Commonly known as: Cymbalta Take 1 capsule (20 mg total) by mouth at bedtime.   furosemide 40 MG tablet Commonly known as: LASIX Take 1 tablet (40 mg total) by mouth daily as needed.   ibuprofen 200 MG tablet Commonly known as: ADVIL Take 800 mg by mouth every 6 (six) hours as needed.   levothyroxine 50 MCG tablet Commonly known as: SYNTHROID Take 1 tablet (50 mcg total) by mouth daily before breakfast.   losartan 50 MG tablet Commonly known as: COZAAR Take 1 tablet (50 mg total) by mouth daily.   tamsulosin 0.4 MG Caps capsule Commonly known as: FLOMAX Take 1 capsule (0.4 mg total) by mouth daily.   verapamil 120 MG CR tablet Commonly known as: CALAN-SR Take 1 tablet (120 mg total) by mouth at bedtime.        Allergies:  Allergies   Allergen Reactions   Penicillins     Family History: Family History  Problem Relation Age of Onset   Diabetes Mother    Stroke Father    Cancer Sister        Pancreatic Cancer   Parkinson's disease Neg Hx    Tremor Neg Hx     Social History:  reports that he quit smoking about 43 years ago. His smoking use included cigarettes. He started smoking about 57 years ago. He has a 13.8 pack-year smoking history. He has been exposed to tobacco smoke. His smokeless tobacco use includes chew. He reports current alcohol use of about 14.0 standard drinks of alcohol per week. He reports that he does not use drugs.  ROS: Pertinent ROS in HPI  Physical Exam: BP 98/66   Pulse 62   Ht 5\' 8"  (1.727 m)   Wt 190 lb (86.2 kg)   BMI 28.89 kg/m   Constitutional:  Well nourished. Alert and oriented, No acute distress. HEENT: [place] AT, moist mucus membranes.  Trachea midline Cardiovascular: No clubbing, cyanosis, or edema. Respiratory: Normal respiratory effort, no increased work of breathing. Neurologic: Grossly intact, no focal deficits, moving all 4 extremities. Psychiatric: Normal mood and affect.  Laboratory Data: Lab Results  Component Value Date   WBC 7.2 02/28/2023   HGB 14.0 02/28/2023   HCT 44.1 02/28/2023   MCV 94 02/28/2023   PLT 258 02/28/2023    Lab Results  Component Value Date   CREATININE 0.98 02/28/2023    Lab Results  Component Value Date   TSH 1.640 02/28/2023       Component Value Date/Time   CHOL 198 02/28/2023 0936   HDL 48 02/28/2023 0936   CHOLHDL 2.7 08/14/2021 0833   LDLCALC 135 (H) 02/28/2023 0936    Lab Results  Component Value Date   AST 18 02/28/2023   Lab Results  Component Value Date   ALT 15 02/28/2023    Urinalysis See EPIC and HPI  I have reviewed the labs.   Pertinent Imaging:  07/01/23 09:23  Scan Result 84ml    Assessment & Plan:    1. BPH with LUTS -PSA stable -PVR < 300 cc  -most bothersome symptoms are nighttime  incontinence -continue conservative management, avoiding bladder irritants and timed voiding's -Continue tamsulosin  0.4 mg daily  2. OAB -I discussed that with his history of Parkinson's, he is not a candidate for anticholinergic OAB medications and the 2 OAB medications that are available, Gemtesa and Myrbetriq, are cost prohibitive and we are no longer getting samples of the Myrbetriq as it is went generic, but the cost does not come down, and we are getting very limited samples of Gemtesa  3. E.coli UTI -His wife mentioned that his urine still smelled strong, we discussed that malodorous urine by itself is not a symptom of urinary tract infection and less it associated with UTI symptoms that should not be treated with antibiotics -I recommended to increase water intake and drink cranberry juice  4.  Nocturnal enuresis -Discussed that this is very difficult to treat due to his Parkinson's and they are hesitant to undergo a sleep study to rule out sleep apnea -We will discontinue the tamsulosin 0.4 mg daily to ensure adequate bladder emptying as incomplete bladder emptying can lead to recurrent UTIs and/or AKI's    Return in about 1 year (around 06/30/2024) for I PSS/PVR .  These notes generated with voice recognition software. I apologize for typographical errors.  Cloretta Ned  [facility] [address]  [address] [place], [place] [address] [phone]

## 2023-07-01 ENCOUNTER — Other Ambulatory Visit: Payer: Self-pay | Admitting: Family Medicine

## 2023-07-01 ENCOUNTER — Ambulatory Visit: Payer: Medicare Other | Admitting: Urology

## 2023-07-01 ENCOUNTER — Encounter: Payer: Self-pay | Admitting: Urology

## 2023-07-01 VITALS — BP 98/66 | HR 62 | Ht 68.0 in | Wt 190.0 lb

## 2023-07-01 DIAGNOSIS — N390 Urinary tract infection, site not specified: Secondary | ICD-10-CM

## 2023-07-01 DIAGNOSIS — N3281 Overactive bladder: Secondary | ICD-10-CM

## 2023-07-01 DIAGNOSIS — N401 Enlarged prostate with lower urinary tract symptoms: Secondary | ICD-10-CM | POA: Diagnosis not present

## 2023-07-01 DIAGNOSIS — N138 Other obstructive and reflux uropathy: Secondary | ICD-10-CM

## 2023-07-01 DIAGNOSIS — N3944 Nocturnal enuresis: Secondary | ICD-10-CM | POA: Diagnosis not present

## 2023-07-01 DIAGNOSIS — I10 Essential (primary) hypertension: Secondary | ICD-10-CM

## 2023-07-01 DIAGNOSIS — B9620 Unspecified Escherichia coli [E. coli] as the cause of diseases classified elsewhere: Secondary | ICD-10-CM

## 2023-07-01 LAB — BLADDER SCAN AMB NON-IMAGING

## 2023-07-01 MED ORDER — TAMSULOSIN HCL 0.4 MG PO CAPS: 0.4000 mg | ORAL_CAPSULE | Freq: Every day | ORAL | 4 refills | Status: AC

## 2023-07-02 NOTE — Telephone Encounter (Signed)
 Requested Prescriptions  Pending Prescriptions Disp Refills   losartan (COZAAR) 50 MG tablet [Pharmacy Med Name: Losartan Potassium 50 MG Oral Tablet] 100 tablet 1    Sig: TAKE 1 TABLET BY MOUTH DAILY     Cardiovascular:  Angiotensin Receptor Blockers Passed - 07/02/2023 10:26 AM      Passed - Cr in normal range and within 180 days    Creatinine, Ser  Date Value Ref Range Status  02/28/2023 0.98 0.76 - 1.27 mg/dL Final         Passed - K in normal range and within 180 days    Potassium  Date Value Ref Range Status  02/28/2023 4.1 3.5 - 5.2 mmol/L Final         Passed - Patient is not pregnant      Passed - Last BP in normal range    BP Readings from Last 1 Encounters:  07/01/23 98/66         Passed - Valid encounter within last 6 months    Recent Outpatient Visits           2 months ago PSP (progressive supranuclear palsy) (HCC)   [facility] [place], Megan P, DO   4 months ago Primary hypertension   [place] [facility] [place], Megan P, DO   8 months ago Hypothyroidism, unspecified type   [place] [facility] [place], Megan P, DO   11 months ago Essential hypertension   [facility] [place], Megan P, DO   1 year ago Routine general medical examination at a health care facility   [facility] Dorcas Carrow, DO       Future Appointments             In 3 months Laural Benes, Oralia Rud, DO [place] [facility]   In 12 months McG[place]an, Elana Alm [facility]

## 2023-08-12 DIAGNOSIS — G4752 REM sleep behavior disorder: Secondary | ICD-10-CM | POA: Diagnosis not present

## 2023-08-12 DIAGNOSIS — Z8739 Personal history of other diseases of the musculoskeletal system and connective tissue: Secondary | ICD-10-CM | POA: Diagnosis not present

## 2023-08-12 DIAGNOSIS — G20C Parkinsonism, unspecified: Secondary | ICD-10-CM | POA: Diagnosis not present

## 2023-08-18 DIAGNOSIS — H02831 Dermatochalasis of right upper eyelid: Secondary | ICD-10-CM | POA: Diagnosis not present

## 2023-08-18 DIAGNOSIS — H2511 Age-related nuclear cataract, right eye: Secondary | ICD-10-CM | POA: Diagnosis not present

## 2023-08-18 DIAGNOSIS — H2512 Age-related nuclear cataract, left eye: Secondary | ICD-10-CM | POA: Diagnosis not present

## 2023-08-26 DIAGNOSIS — H2511 Age-related nuclear cataract, right eye: Secondary | ICD-10-CM | POA: Diagnosis not present

## 2023-08-26 DIAGNOSIS — H2512 Age-related nuclear cataract, left eye: Secondary | ICD-10-CM | POA: Diagnosis not present

## 2023-08-28 ENCOUNTER — Encounter: Payer: Self-pay | Admitting: Ophthalmology

## 2023-08-30 ENCOUNTER — Other Ambulatory Visit: Payer: Self-pay | Admitting: Family Medicine

## 2023-09-02 NOTE — Discharge Instructions (Signed)

## 2023-09-04 ENCOUNTER — Ambulatory Visit: Payer: Self-pay | Admitting: Anesthesiology

## 2023-09-04 ENCOUNTER — Encounter: Payer: Self-pay | Admitting: Ophthalmology

## 2023-09-04 ENCOUNTER — Encounter
Admission: RE | Disposition: A | Discharge status: Home / Self Care | Payer: Self-pay | Source: Home / Self Care | Attending: Ophthalmology

## 2023-09-04 ENCOUNTER — Other Ambulatory Visit: Payer: Self-pay

## 2023-09-04 ENCOUNTER — Ambulatory Visit
Admission: RE | Admit: 2023-09-04 | Discharge: 2023-09-04 | Disposition: A | Discharge status: Home / Self Care | Attending: Ophthalmology | Admitting: Ophthalmology

## 2023-09-04 DIAGNOSIS — E039 Hypothyroidism, unspecified: Secondary | ICD-10-CM | POA: Insufficient documentation

## 2023-09-04 DIAGNOSIS — Z833 Family history of diabetes mellitus: Secondary | ICD-10-CM | POA: Insufficient documentation

## 2023-09-04 DIAGNOSIS — E785 Hyperlipidemia, unspecified: Secondary | ICD-10-CM | POA: Insufficient documentation

## 2023-09-04 DIAGNOSIS — G20A1 Parkinson's disease without dyskinesia, without mention of fluctuations: Secondary | ICD-10-CM | POA: Insufficient documentation

## 2023-09-04 DIAGNOSIS — Z79899 Other long term (current) drug therapy: Secondary | ICD-10-CM | POA: Insufficient documentation

## 2023-09-04 DIAGNOSIS — Z87891 Personal history of nicotine dependence: Secondary | ICD-10-CM | POA: Insufficient documentation

## 2023-09-04 DIAGNOSIS — H2512 Age-related nuclear cataract, left eye: Secondary | ICD-10-CM | POA: Insufficient documentation

## 2023-09-04 DIAGNOSIS — Z7982 Long term (current) use of aspirin: Secondary | ICD-10-CM | POA: Diagnosis not present

## 2023-09-04 DIAGNOSIS — N400 Enlarged prostate without lower urinary tract symptoms: Secondary | ICD-10-CM | POA: Insufficient documentation

## 2023-09-04 DIAGNOSIS — I10 Essential (primary) hypertension: Secondary | ICD-10-CM | POA: Diagnosis not present

## 2023-09-04 DIAGNOSIS — R519 Headache, unspecified: Secondary | ICD-10-CM | POA: Diagnosis not present

## 2023-09-04 DIAGNOSIS — Z79890 Hormone replacement therapy (postmenopausal): Secondary | ICD-10-CM | POA: Diagnosis not present

## 2023-09-04 HISTORY — DX: Presence of dental prosthetic device (complete) (partial): Z97.2

## 2023-09-04 HISTORY — PX: CATARACT EXTRACTION W/PHACO: SHX586

## 2023-09-04 SURGERY — PHACOEMULSIFICATION, CATARACT, WITH IOL INSERTION
Anesthesia: Monitor Anesthesia Care | Site: Eye | Laterality: Left

## 2023-09-04 MED ORDER — SIGHTPATH DOSE#1 NA HYALUR & NA CHOND-NA HYALUR IO KIT
PACK | INTRAOCULAR | Status: DC | PRN
  Administered 2023-09-04: 1 via OPHTHALMIC

## 2023-09-04 MED ORDER — ARMC OPHTHALMIC DILATING DROPS
1.0000 | OPHTHALMIC | Status: DC | PRN
  Administered 2023-09-04 (×3): 1 via OPHTHALMIC

## 2023-09-04 MED ORDER — LIDOCAINE HCL (PF) 2 % IJ SOLN
INTRAOCULAR | Status: DC | PRN
  Administered 2023-09-04: 4 mL via INTRAOCULAR

## 2023-09-04 MED ORDER — MIDAZOLAM HCL 2 MG/2ML IJ SOLN
INTRAMUSCULAR | Status: DC | PRN
  Administered 2023-09-04: 2 mg via INTRAVENOUS

## 2023-09-04 MED ORDER — SIGHTPATH DOSE#1 BSS IO SOLN
INTRAOCULAR | Status: DC | PRN
Start: 2023-09-04 — End: 2023-09-04
  Administered 2023-09-04: 15 mL via INTRAOCULAR

## 2023-09-04 MED ORDER — ARMC OPHTHALMIC DILATING DROPS
OPHTHALMIC | Status: AC
  Filled 2023-09-04: qty 0.5

## 2023-09-04 MED ORDER — BRIMONIDINE TARTRATE-TIMOLOL 0.2-0.5 % OP SOLN
OPHTHALMIC | Status: DC | PRN
  Administered 2023-09-04: 1 [drp] via OPHTHALMIC

## 2023-09-04 MED ORDER — TETRACAINE HCL 0.5 % OP SOLN
1.0000 [drp] | OPHTHALMIC | Status: DC | PRN
  Administered 2023-09-04 (×3): 1 [drp] via OPHTHALMIC

## 2023-09-04 MED ORDER — PHENYLEPHRINE-KETOROLAC 1-0.3 % IO SOLN
INTRAOCULAR | Status: DC | PRN
  Administered 2023-09-04: 83 mL via OPHTHALMIC

## 2023-09-04 MED ORDER — MOXIFLOXACIN HCL 0.5 % OP SOLN
OPHTHALMIC | Status: DC | PRN
  Administered 2023-09-04: .2 mL via OPHTHALMIC

## 2023-09-04 MED ORDER — MIDAZOLAM HCL 2 MG/2ML IJ SOLN
INTRAMUSCULAR | Status: AC
  Filled 2023-09-04: qty 2

## 2023-09-04 MED ORDER — TETRACAINE HCL 0.5 % OP SOLN
OPHTHALMIC | Status: AC
  Filled 2023-09-04: qty 4

## 2023-09-04 SURGICAL SUPPLY — 13 items
CATARACT SUITE SIGHTPATH (MISCELLANEOUS) ×1 IMPLANT
DISSECTOR HYDRO NUCLEUS 50X22 (MISCELLANEOUS) ×1 IMPLANT
DRSG TEGADERM 2-3/8X2-3/4 SM (GAUZE/BANDAGES/DRESSINGS) ×1 IMPLANT
FEE CATARACT SUITE SIGHTPATH (MISCELLANEOUS) ×1 IMPLANT
GLOVE BIOGEL PI IND STRL 8 (GLOVE) ×1 IMPLANT
GLOVE SURG LX STRL 7.5 STRW (GLOVE) ×1 IMPLANT
GLOVE SURG PROTEXIS BL SZ6.5 (GLOVE) ×1 IMPLANT
GLOVE SURG SYN 6.5 PF PI BL (GLOVE) ×1 IMPLANT
LENS IOL DIOP 20.0 (Intraocular Lens) ×1 IMPLANT
LENS IOL TECNIS MONO 20.0 (Intraocular Lens) IMPLANT
NDL FILTER BLUNT 18X1 1/2 (NEEDLE) ×1 IMPLANT
NEEDLE FILTER BLUNT 18X1 1/2 (NEEDLE) ×1 IMPLANT
SYR 3ML LL SCALE MARK (SYRINGE) ×1 IMPLANT

## 2023-09-04 NOTE — Transfer of Care (Signed)
 Immediate Anesthesia Transfer of Care Note  Patient: Arthur Richardson  Procedure(s) Performed: PHACOEMULSIFICATION, CATARACT, WITH IOL INSERTION 10.94 00:59.2 (Left: Eye)  Patient Location: PACU  Anesthesia Type: MAC  Level of Consciousness: awake, alert  and patient cooperative  Airway and Oxygen Therapy: Patient Spontanous Breathing and Patient connected to supplemental oxygen  Post-op Assessment: Post-op Vital signs reviewed, Patient's Cardiovascular Status Stable, Respiratory Function Stable, Patent Airway and No signs of Nausea or vomiting  Post-op Vital Signs: Reviewed and stable  Complications: No notable events documented.

## 2023-09-04 NOTE — Anesthesia Preprocedure Evaluation (Signed)
 Anesthesia Evaluation  Patient identified by MRN, date of birth, ID band Patient awake    Reviewed: Allergy & Precautions, NPO status , Patient's Chart, lab work & pertinent test results  History of Anesthesia Complications Negative for: history of anesthetic complications  Airway Mallampati: III   Neck ROM: Full    Dental  (+) Edentulous Upper, Edentulous Lower   Pulmonary former smoker (quit 1981)   Pulmonary exam normal breath sounds clear to auscultation       Cardiovascular hypertension, Normal cardiovascular exam Rhythm:Regular Rate:Normal     Neuro/Psych  Headaches  Neuromuscular disease (Parkinson disease)    GI/Hepatic negative GI ROS,,,  Endo/Other  Hypothyroidism    Renal/GU negative Renal ROS   BPH    Musculoskeletal   Abdominal   Peds  Hematology negative hematology ROS (+)   Anesthesia Other Findings   Reproductive/Obstetrics                             Anesthesia Physical Anesthesia Plan  ASA: 3  Anesthesia Plan: MAC   Post-op Pain Management:    Induction: Intravenous  PONV Risk Score and Plan: 1 and Treatment may vary due to age or medical condition, Midazolam  and TIVA  Airway Management Planned: Natural Airway and Nasal Cannula  Additional Equipment:   Intra-op Plan:   Post-operative Plan:   Informed Consent: I have reviewed the patients History and Physical, chart, labs and discussed the procedure including the risks, benefits and alternatives for the proposed anesthesia with the patient or authorized representative who has indicated his/her understanding and acceptance.     Dental advisory given  Plan Discussed with: CRNA  Anesthesia Plan Comments: (LMA/GETA backup discussed.  Patient consented for risks of anesthesia including but not limited to:  - adverse reactions to medications - damage to eyes, teeth, lips or other oral mucosa - nerve  damage due to positioning  - sore throat or hoarseness - damage to heart, brain, nerves, lungs, other parts of body or loss of life  Informed patient about role of CRNA in peri- and intra-operative care.  Patient voiced understanding.)       Anesthesia Quick Evaluation

## 2023-09-04 NOTE — Op Note (Signed)
 OPERATIVE NOTE  Arthur Richardson [id number] 09/04/2023   PREOPERATIVE DIAGNOSIS: Nuclear sclerotic cataract left eye. H25.12   POSTOPERATIVE DIAGNOSIS: Nuclear sclerotic cataract left eye. H25.12   PROCEDURE:  Phacoemusification with posterior chamber intraocular lens placement of the left eye  Ultrasound time: Procedure(s): PHACOEMULSIFICATION, CATARACT, WITH IOL INSERTION 10.94 00:59.2 (Left)  LENS:   Implant Name Type Inv. Item Serial No. Manufacturer Lot No. LRB No. Used Action  LENS IOL DIOP 20.0 - [id number] Intraocular Lens LENS IOL DIOP 20.0 [id number] SIGHTPATH  Left 1 Implanted      SURGEON:  Rosy Cooper. Donalda Fruit, MD   ANESTHESIA:  Topical with tetracaine drops, augmented with 1% preservative-free intracameral lidocaine.   COMPLICATIONS:  None.   DESCRIPTION OF PROCEDURE:  The patient was identified in the holding room and transported to the operating room and placed in the supine position under the operating microscope.  The left eye was identified as the operative eye, which was prepped and draped in the usual sterile ophthalmic fashion.   A 1 millimeter clear-corneal paracentesis was made inferotemporally. Preservative-free 1% lidocaine mixed with 1:1,000 bisulfite-free aqueous solution of epinephrine was injected into the anterior chamber. The anterior chamber was then filled with Viscoat viscoelastic. A 2.4 millimeter keratome was used to make a clear-corneal incision superotemporally. A curvilinear capsulorrhexis was made with a cystotome and capsulorrhexis forceps. Balanced salt solution was used to hydrodissect and hydrodelineate the nucleus. Phacoemulsification was then used to remove the lens nucleus and epinucleus. The remaining cortex was then removed using the irrigation and aspiration handpiece. Provisc was then placed into the capsular bag to distend it for lens placement. An +20.00 D DCB00 intraocular lens was then injected into the capsular bag. The remaining  viscoelastic was aspirated.   Wounds were hydrated with balanced salt solution.  The anterior chamber was inflated to a physiologic pressure with balanced salt solution.  No wound leaks were noted. Moxifloxacin was injected intracamerally.  Timolol and Brimonidine drops were applied to the eye.  The patient was taken to the recovery room in stable condition without complications of anesthesia or surgery.  Arthur Richardson 09/04/2023, 12:00 PM

## 2023-09-04 NOTE — H&P (Signed)
 [facility]   Primary Care Physician:  Solomon Dupre, DO Ophthalmologist: Dr. Meg Spina  Pre-Procedure History & Physical: HPI:  Arthur Richardson is a 73 y.o. male here for cataract surgery.   Past Medical History:  Diagnosis Date   Hyperlipidemia    Hypertension    Wears dentures    full upper and lower, only wears upper    Past Surgical History:  Procedure Laterality Date   LITHOTRIPSY  09/1998 & 07/29/1999   Due to nephrolithiasis   PROSTATE SURGERY     Biopsy= Negative   TONSILLECTOMY      Prior to Admission medications   Medication Sig Start Date End Date Taking? Authorizing Provider  acetaminophen  (TYLENOL ) 500 MG tablet Take 500 mg by mouth every 6 (six) hours as needed.   Yes [provider]  aspirin EC 81 MG tablet Take 81 mg by mouth daily.   Yes [provider]  cyanocobalamin  (VITAMIN B12) 1000 MCG tablet Take 1,000 mcg by mouth daily.   Yes [provider]  DULoxetine  (CYMBALTA ) 20 MG capsule Take 1 capsule (20 mg total) by mouth at bedtime. 04/16/23  Yes Johnson, Megan P, DO  furosemide  (LASIX ) 40 MG tablet Take 1 tablet (40 mg total) by mouth daily as needed. 02/28/23  Yes Johnson, Megan P, DO  ibuprofen (ADVIL) 200 MG tablet Take 800 mg by mouth every 6 (six) hours as needed.   Yes [provider]  levothyroxine  (SYNTHROID ) 50 MCG tablet Take 1 tablet (50 mcg total) by mouth daily before breakfast. 03/07/23  Yes Johnson, Megan P, DO  losartan  (COZAAR ) 50 MG tablet TAKE 1 TABLET BY MOUTH DAILY 07/02/23  Yes Johnson, Megan P, DO  Melatonin 10 MG TABS Take by mouth at bedtime.   Yes [provider]  tamsulosin  (FLOMAX ) 0.4 MG CAPS capsule Take 1 capsule (0.4 mg total) by mouth daily. 07/01/23  Yes McGowan, Cathleen Coach A, PA-C  verapamil  (CALAN -SR) 120 MG CR tablet TAKE 1 TABLET BY MOUTH AT  BEDTIME 09/02/23   Terre Ferri P, DO    Allergies as of 08/20/2023 - Review Complete 07/01/2023  Allergen Reaction Noted    Penicillins  10/11/2014    Family History  Problem Relation Age of Onset   Diabetes Mother    Stroke Father    Cancer Sister        Pancreatic Cancer   Parkinson's disease Neg Hx    Tremor Neg Hx     Social History   Socioeconomic History   Marital status: Married    Spouse name: [name]   Number of children: 2   Years of education: Not on file   Highest education level: Not on file  Occupational History   Not on file  Tobacco Use   Smoking status: Former    Current packs/day: 0.00    Average packs/day: 1 pack/day for 13.8 years (13.8 ttl pk-yrs)    Types: Cigarettes    Start date: 18    Quit date: 01/31/1980    Years since quitting: 43.6    Passive exposure: Past   Smokeless tobacco: Current    Types: Chew  Vaping Use   Vaping status: Never Used  Substance and Sexual Activity   Alcohol use: Yes    Alcohol/week: 14.0 standard drinks of alcohol    Types: 14 Cans of beer per week    Comment: 06/17/23 1 beer every 2 weeks   Drug use: No   Sexual activity: Yes  Other  Topics Concern   Not on file  Social History Narrative   Not on file   Social Drivers of Health   Financial Resource Strain: Low Risk  (06/17/2023)   Overall Financial Resource Strain (CARDIA)    Difficulty of Paying Living Expenses: Not hard at all  Food Insecurity: No Food Insecurity (06/17/2023)   Hunger Vital Sign    Worried About Running Out of Food in the Last Year: Never true    Ran Out of Food in the Last Year: Never true  Transportation Needs: No Transportation Needs (06/17/2023)   PRAPARE - Administrator, Civil Service (Medical): No    Lack of Transportation (Non-Medical): No  Physical Activity: Sufficiently Active (06/17/2023)   Exercise Vital Sign    Days of Exercise per Week: 7 days    Minutes of Exercise per Session: 60 min  Stress: No Stress Concern Present (06/17/2023)   [employer] of Occupational Health - Occupational Stress Questionnaire    Feeling of Stress  : Not at all  Social Connections: Moderately Integrated (06/17/2023)   Social Connection and Isolation Panel [NHANES]    Frequency of Communication with Friends and Family: Twice a week    Frequency of Social Gatherings with Friends and Family: More than three times a week    Attends Religious Services: More than 4 times per year    Active Member of [employer] or Organizations: No    Attends Banker Meetings: Never    Marital Status: Married  Catering manager Violence: Not At Risk (06/17/2023)   Humiliation, Afraid, Rape, and Kick questionnaire    Fear of Current or Ex-Partner: No    Emotionally Abused: No    Physically Abused: No    Sexually Abused: No    Review of Systems: See HPI, otherwise negative ROS  Physical Exam: Ht 5\' 8"  (1.727 m)   Wt 85.7 kg   BMI 28.74 kg/m  General:   Alert, cooperative in NAD Head:  Normocephalic and atraumatic. Respiratory:  Normal work of breathing. Cardiovascular:  RRR  Impression/Plan: Arthur Richardson is here for cataract surgery.  Risks, benefits, limitations, and alternatives regarding cataract surgery have been reviewed with the patient.  Questions have been answered.  All parties agreeable.   Trudi Fus, MD  09/04/2023, 7:13 AM

## 2023-09-04 NOTE — Anesthesia Postprocedure Evaluation (Signed)
 Anesthesia Post Note  Patient: Drew Ludeman  Procedure(s) Performed: PHACOEMULSIFICATION, CATARACT, WITH IOL INSERTION 10.94 00:59.2 (Left: Eye)  Patient location during evaluation: PACU Anesthesia Type: MAC Level of consciousness: awake and alert, oriented and patient cooperative Pain management: pain level controlled Vital Signs Assessment: post-procedure vital signs reviewed and stable Respiratory status: spontaneous breathing, nonlabored ventilation and respiratory function stable Cardiovascular status: blood pressure returned to baseline and stable Postop Assessment: adequate PO intake Anesthetic complications: no   No notable events documented.   Last Vitals:  Vitals:   09/04/23 1202 09/04/23 1207  BP: 99/64 109/76  Pulse: 64 63  Resp: 18 14  Temp: (!) 36.4 C (!) 36.4 C  SpO2: 98% 98%    Last Pain:  Vitals:   09/04/23 1207  PainSc: 0-No pain                 Dorothey Gate

## 2023-09-05 ENCOUNTER — Encounter: Payer: Self-pay | Admitting: Ophthalmology

## 2023-09-08 ENCOUNTER — Encounter: Payer: Self-pay | Admitting: Ophthalmology

## 2023-09-08 NOTE — Anesthesia Preprocedure Evaluation (Addendum)
 Anesthesia Evaluation  Patient identified by MRN, date of birth, ID band Patient awake    Reviewed: Allergy & Precautions, H&P , NPO status , Patient's Chart, lab work & pertinent test results  Airway Mallampati: III  TM Distance: >3 FB Neck ROM: Full    Dental no notable dental hx. (+) Edentulous Upper, Edentulous Lower   Pulmonary neg pulmonary ROS, former smoker   Pulmonary exam normal breath sounds clear to auscultation       Cardiovascular hypertension, + Peripheral Vascular Disease  negative cardio ROS Normal cardiovascular exam Rhythm:Regular Rate:Normal     Neuro/Psych  Headaches  Neuromuscular disease negative neurological ROS  negative psych ROS   GI/Hepatic negative GI ROS, Neg liver ROS,,,  Endo/Other  negative endocrine ROSHypothyroidism    Renal/GU Renal diseasenegative Renal ROS  negative genitourinary   Musculoskeletal negative musculoskeletal ROS (+)    Abdominal   Peds negative pediatric ROS (+)  Hematology negative hematology ROS (+)   Anesthesia Other Findings Previous cataract surgery 09-04-23 Dr. Mazzoni   Medical History  Hypertension  Hyperlipidemia Wears dentures  Primary parkinsonism  Progressive supranuclear palsy      Reproductive/Obstetrics negative OB ROS                              Anesthesia Physical Anesthesia Plan  ASA: 3  Anesthesia Plan: MAC   Post-op Pain Management:    Induction: Intravenous  PONV Risk Score and Plan:   Airway Management Planned: Natural Airway and Nasal Cannula  Additional Equipment:   Intra-op Plan:   Post-operative Plan:   Informed Consent: I have reviewed the patients History and Physical, chart, labs and discussed the procedure including the risks, benefits and alternatives for the proposed anesthesia with the patient or authorized representative who has indicated his/her understanding and acceptance.      Dental Advisory Given  Plan Discussed with: Anesthesiologist, CRNA and Surgeon  Anesthesia Plan Comments: (Patient consented for risks of anesthesia including but not limited to:  - adverse reactions to medications - damage to eyes, teeth, lips or other oral mucosa - nerve damage due to positioning  - sore throat or hoarseness - Damage to heart, brain, nerves, lungs, other parts of body or loss of life  Patient voiced understanding and assent.)         Anesthesia Quick Evaluation

## 2023-09-10 DIAGNOSIS — H2511 Age-related nuclear cataract, right eye: Secondary | ICD-10-CM | POA: Diagnosis not present

## 2023-09-12 ENCOUNTER — Other Ambulatory Visit: Payer: Self-pay | Admitting: Family Medicine

## 2023-09-12 NOTE — Discharge Instructions (Signed)

## 2023-09-15 NOTE — Telephone Encounter (Signed)
 Requested Prescriptions  Pending Prescriptions Disp Refills   DULoxetine  (CYMBALTA ) 20 MG capsule [Pharmacy Med Name: DULoxetine  HCl 20 MG Oral Capsule Delayed Release Particles] 90 capsule 0    Sig: TAKE 1 CAPSULE BY MOUTH AT  BEDTIME     Psychiatry: Antidepressants - SNRI - duloxetine  Passed - 09/15/2023  2:17 PM      Passed - Cr in normal range and within 360 days    Creatinine, Ser  Date Value Ref Range Status  02/28/2023 0.98 0.76 - 1.27 mg/dL Final         Passed - eGFR is 30 or above and within 360 days    GFR calc Af Amer  Date Value Ref Range Status  06/22/2020 79 >59 mL/min/1.73 Final    Comment:    **In accordance with recommendations from the NKF-ASN Task force,**   [facility] is in the process of updating its eGFR calculation to the   2021 CKD-EPI creatinine equation that estimates kidney function   without a race variable.    GFR calc non Af Amer  Date Value Ref Range Status  06/22/2020 68 >59 mL/min/1.73 Final   eGFR  Date Value Ref Range Status  02/28/2023 81 >59 mL/min/1.73 Final         Passed - Completed PHQ-2 or PHQ-9 in the last 360 days      Passed - Last BP in normal range    BP Readings from Last 1 Encounters:  09/04/23 109/76         Passed - Valid encounter within last 6 months    Recent Outpatient Visits   None     Future Appointments             In 1 month Johnson, Jerilee Montane, DO [facility] [facility] [facility]   In 9 months McGowan, Nyra Bellis [facility] [place]

## 2023-09-18 ENCOUNTER — Encounter: Payer: Self-pay | Admitting: Ophthalmology

## 2023-09-18 ENCOUNTER — Ambulatory Visit: Payer: Self-pay | Admitting: Anesthesiology

## 2023-09-18 ENCOUNTER — Ambulatory Visit
Admission: RE | Admit: 2023-09-18 | Discharge: 2023-09-18 | Disposition: A | Discharge status: Home / Self Care | Attending: Ophthalmology | Admitting: Ophthalmology

## 2023-09-18 ENCOUNTER — Other Ambulatory Visit: Payer: Self-pay

## 2023-09-18 ENCOUNTER — Encounter
Admission: RE | Disposition: A | Discharge status: Home / Self Care | Payer: Self-pay | Source: Home / Self Care | Attending: Ophthalmology

## 2023-09-18 DIAGNOSIS — I10 Essential (primary) hypertension: Secondary | ICD-10-CM | POA: Diagnosis not present

## 2023-09-18 DIAGNOSIS — Z7982 Long term (current) use of aspirin: Secondary | ICD-10-CM | POA: Insufficient documentation

## 2023-09-18 DIAGNOSIS — G231 Progressive supranuclear ophthalmoplegia [Steele-Richardson-Olszewski]: Secondary | ICD-10-CM | POA: Insufficient documentation

## 2023-09-18 DIAGNOSIS — Z79890 Hormone replacement therapy (postmenopausal): Secondary | ICD-10-CM | POA: Insufficient documentation

## 2023-09-18 DIAGNOSIS — Z79899 Other long term (current) drug therapy: Secondary | ICD-10-CM | POA: Insufficient documentation

## 2023-09-18 DIAGNOSIS — H2511 Age-related nuclear cataract, right eye: Secondary | ICD-10-CM | POA: Insufficient documentation

## 2023-09-18 DIAGNOSIS — G20C Parkinsonism, unspecified: Secondary | ICD-10-CM | POA: Insufficient documentation

## 2023-09-18 DIAGNOSIS — Z961 Presence of intraocular lens: Secondary | ICD-10-CM | POA: Insufficient documentation

## 2023-09-18 DIAGNOSIS — R519 Headache, unspecified: Secondary | ICD-10-CM | POA: Insufficient documentation

## 2023-09-18 DIAGNOSIS — I739 Peripheral vascular disease, unspecified: Secondary | ICD-10-CM | POA: Insufficient documentation

## 2023-09-18 DIAGNOSIS — E039 Hypothyroidism, unspecified: Secondary | ICD-10-CM | POA: Diagnosis not present

## 2023-09-18 DIAGNOSIS — Z87891 Personal history of nicotine dependence: Secondary | ICD-10-CM | POA: Diagnosis not present

## 2023-09-18 DIAGNOSIS — Z833 Family history of diabetes mellitus: Secondary | ICD-10-CM | POA: Insufficient documentation

## 2023-09-18 DIAGNOSIS — Z9842 Cataract extraction status, left eye: Secondary | ICD-10-CM | POA: Diagnosis not present

## 2023-09-18 HISTORY — PX: CATARACT EXTRACTION W/PHACO: SHX586

## 2023-09-18 HISTORY — DX: Progressive supranuclear ophthalmoplegia (steele-Richardson-olszewski): G23.1

## 2023-09-18 HISTORY — DX: Parkinsonism, unspecified: G20.C

## 2023-09-18 SURGERY — PHACOEMULSIFICATION, CATARACT, WITH IOL INSERTION
Anesthesia: Monitor Anesthesia Care | Site: Eye | Laterality: Right

## 2023-09-18 MED ORDER — FENTANYL CITRATE (PF) 100 MCG/2ML IJ SOLN
INTRAMUSCULAR | Status: AC
  Filled 2023-09-18: qty 2

## 2023-09-18 MED ORDER — ARMC OPHTHALMIC DILATING DROPS
OPHTHALMIC | Status: AC
  Filled 2023-09-18: qty 0.5

## 2023-09-18 MED ORDER — SIGHTPATH DOSE#1 BSS IO SOLN
INTRAOCULAR | Status: DC | PRN
Start: 2023-09-18 — End: 2023-09-18
  Administered 2023-09-18: 15 mL via INTRAOCULAR

## 2023-09-18 MED ORDER — SIGHTPATH DOSE#1 BSS IO SOLN
INTRAOCULAR | Status: DC | PRN
  Administered 2023-09-18: 83 mL via OPHTHALMIC

## 2023-09-18 MED ORDER — FENTANYL CITRATE (PF) 100 MCG/2ML IJ SOLN
INTRAMUSCULAR | Status: DC | PRN
  Administered 2023-09-18 (×2): 25 ug via INTRAVENOUS

## 2023-09-18 MED ORDER — PHENYLEPHRINE-KETOROLAC 1-0.3 % IO SOLN
INTRAOCULAR | Status: DC | PRN
  Administered 2023-09-18: 83 mL via OPHTHALMIC

## 2023-09-18 MED ORDER — MOXIFLOXACIN HCL 0.5 % OP SOLN
OPHTHALMIC | Status: DC | PRN
  Administered 2023-09-18: .2 mL via OPHTHALMIC

## 2023-09-18 MED ORDER — MIDAZOLAM HCL 2 MG/2ML IJ SOLN
INTRAMUSCULAR | Status: AC
  Filled 2023-09-18: qty 2

## 2023-09-18 MED ORDER — SIGHTPATH DOSE#1 NA HYALUR & NA CHOND-NA HYALUR IO KIT
PACK | INTRAOCULAR | Status: DC | PRN
  Administered 2023-09-18: 1 via OPHTHALMIC

## 2023-09-18 MED ORDER — BRIMONIDINE TARTRATE-TIMOLOL 0.2-0.5 % OP SOLN
OPHTHALMIC | Status: DC | PRN
  Administered 2023-09-18: 1 [drp] via OPHTHALMIC

## 2023-09-18 MED ORDER — MIDAZOLAM HCL 2 MG/2ML IJ SOLN
INTRAMUSCULAR | Status: DC | PRN
  Administered 2023-09-18 (×2): 1 mg via INTRAVENOUS

## 2023-09-18 MED ORDER — BALANCED SALT IO SOLN
INTRAOCULAR | Status: DC | PRN
  Administered 2023-09-18: 4 mL via INTRAOCULAR

## 2023-09-18 MED ORDER — TETRACAINE HCL 0.5 % OP SOLN
OPHTHALMIC | Status: AC
  Filled 2023-09-18: qty 4

## 2023-09-18 MED ORDER — TETRACAINE HCL 0.5 % OP SOLN
1.0000 [drp] | OPHTHALMIC | Status: AC | PRN
  Administered 2023-09-18 (×3): 1 [drp] via OPHTHALMIC

## 2023-09-18 MED ORDER — ARMC OPHTHALMIC DILATING DROPS
1.0000 | OPHTHALMIC | Status: DC | PRN
  Administered 2023-09-18 (×3): 1 via OPHTHALMIC

## 2023-09-18 SURGICAL SUPPLY — 12 items
CATARACT SUITE SIGHTPATH (MISCELLANEOUS) ×1 IMPLANT
DISSECTOR HYDRO NUCLEUS 50X22 (MISCELLANEOUS) ×1 IMPLANT
DRSG TEGADERM 2-3/8X2-3/4 SM (GAUZE/BANDAGES/DRESSINGS) ×1 IMPLANT
FEE CATARACT SUITE SIGHTPATH (MISCELLANEOUS) ×1 IMPLANT
GLOVE BIOGEL PI IND STRL 8 (GLOVE) ×1 IMPLANT
GLOVE SURG LX STRL 7.5 STRW (GLOVE) ×1 IMPLANT
GLOVE SURG PROTEXIS BL SZ6.5 (GLOVE) ×1 IMPLANT
GLOVE SURG SYN 6.5 PF PI BL (GLOVE) ×1 IMPLANT
LENS IOL TECNIS MONO 21.0 (Intraocular Lens) IMPLANT
NDL FILTER BLUNT 18X1 1/2 (NEEDLE) ×1 IMPLANT
NEEDLE FILTER BLUNT 18X1 1/2 (NEEDLE) ×1 IMPLANT
SYR 3ML LL SCALE MARK (SYRINGE) ×1 IMPLANT

## 2023-09-18 NOTE — H&P (Signed)
 [facility]   Primary Care Physician:  Solomon Dupre, DO Ophthalmologist: Dr. Meg Spina  Pre-Procedure History & Physical: HPI:  Arthur Richardson is a 73 y.o. male here for cataract surgery.   Past Medical History:  Diagnosis Date   Hyperlipidemia    Hypertension    Primary parkinsonism (HCC)    Progressive supranuclear palsy (HCC)    Wears dentures    full upper and lower, only wears upper    Past Surgical History:  Procedure Laterality Date   CATARACT EXTRACTION W/PHACO Left 09/04/2023   Procedure: PHACOEMULSIFICATION, CATARACT, WITH IOL INSERTION 10.94 00:59.2;  Surgeon: Trudi Fus, MD;  Location: [facility];  Service: Ophthalmology;  Laterality: Left;   LITHOTRIPSY  09/1998 & 07/29/1999   Due to nephrolithiasis   PROSTATE SURGERY     Biopsy= Negative   TONSILLECTOMY      Prior to Admission medications   Medication Sig Start Date End Date Taking? Authorizing Provider  acetaminophen  (TYLENOL ) 500 MG tablet Take 500 mg by mouth every 6 (six) hours as needed.    [provider]  aspirin EC 81 MG tablet Take 81 mg by mouth daily.    [provider]  cyanocobalamin  (VITAMIN B12) 1000 MCG tablet Take 1,000 mcg by mouth daily.    [provider]  DULoxetine  (CYMBALTA ) 20 MG capsule TAKE 1 CAPSULE BY MOUTH AT  BEDTIME 09/15/23   Johnson, Megan P, DO  furosemide  (LASIX ) 40 MG tablet Take 1 tablet (40 mg total) by mouth daily as needed. 02/28/23   Johnson, Megan P, DO  ibuprofen (ADVIL) 200 MG tablet Take 800 mg by mouth every 6 (six) hours as needed.    [provider]  levothyroxine  (SYNTHROID ) 50 MCG tablet Take 1 tablet (50 mcg total) by mouth daily before breakfast. 03/07/23   Johnson, Megan P, DO  losartan  (COZAAR ) 50 MG tablet TAKE 1 TABLET BY MOUTH DAILY 07/02/23   Johnson, Megan P, DO  Melatonin 10 MG TABS Take by mouth at bedtime.    [provider]  tamsulosin  (FLOMAX ) 0.4 MG CAPS capsule Take 1  capsule (0.4 mg total) by mouth daily. 07/01/23   Matilde Son A, PA-C  verapamil  (CALAN -SR) 120 MG CR tablet TAKE 1 TABLET BY MOUTH AT  BEDTIME 09/02/23   Terre Ferri P, DO    Allergies as of 08/20/2023 - Review Complete 07/01/2023  Allergen Reaction Noted   Penicillins  10/11/2014    Family History  Problem Relation Age of Onset   Diabetes Mother    Stroke Father    Cancer Sister        Pancreatic Cancer   Parkinson's disease Neg Hx    Tremor Neg Hx     Social History   Socioeconomic History   Marital status: Married    Spouse name: [name]   Number of children: 2   Years of education: Not on file   Highest education level: Not on file  Occupational History   Not on file  Tobacco Use   Smoking status: Former    Current packs/day: 0.00    Average packs/day: 1 pack/day for 13.8 years (13.8 ttl pk-yrs)    Types: Cigarettes    Start date: 23    Quit date: 01/31/1980    Years since quitting: 43.6    Passive exposure: Past   Smokeless tobacco: Current    Types: Chew  Vaping Use   Vaping status: Never Used  Substance and Sexual Activity  Alcohol use: Yes    Alcohol/week: 14.0 standard drinks of alcohol    Types: 14 Cans of beer per week    Comment: 06/17/23 1 beer every 2 weeks   Drug use: No   Sexual activity: Yes  Other Topics Concern   Not on file  Social History Narrative   Not on file   Social Drivers of Health   Financial Resource Strain: Low Risk  (06/17/2023)   Overall Financial Resource Strain (CARDIA)    Difficulty of Paying Living Expenses: Not hard at all  Food Insecurity: No Food Insecurity (06/17/2023)   Hunger Vital Sign    Worried About Running Out of Food in the Last Year: Never true    Ran Out of Food in the Last Year: Never true  Transportation Needs: No Transportation Needs (06/17/2023)   PRAPARE - Administrator, Civil Service (Medical): No    Lack of Transportation (Non-Medical): No  Physical Activity: Sufficiently Active  (06/17/2023)   Exercise Vital Sign    Days of Exercise per Week: 7 days    Minutes of Exercise per Session: 60 min  Stress: No Stress Concern Present (06/17/2023)   [employer] of Occupational Health - Occupational Stress Questionnaire    Feeling of Stress : Not at all  Social Connections: Moderately Integrated (06/17/2023)   Social Connection and Isolation Panel [NHANES]    Frequency of Communication with Friends and Family: Twice a week    Frequency of Social Gatherings with Friends and Family: More than three times a week    Attends Religious Services: More than 4 times per year    Active Member of [employer] or Organizations: No    Attends Banker Meetings: Never    Marital Status: Married  Catering manager Violence: Not At Risk (06/17/2023)   Humiliation, Afraid, Rape, and Kick questionnaire    Fear of Current or Ex-Partner: No    Emotionally Abused: No    Physically Abused: No    Sexually Abused: No    Review of Systems: See HPI, otherwise negative ROS  Physical Exam: There were no vitals taken for this visit. General:   Alert, cooperative in NAD Head:  Normocephalic and atraumatic. Respiratory:  Normal work of breathing. Cardiovascular:  RRR  Impression/Plan: Arthur Richardson is here for cataract surgery.  Risks, benefits, limitations, and alternatives regarding cataract surgery have been reviewed with the patient.  Questions have been answered.  All parties agreeable.   Trudi Fus, MD  09/18/2023, 7:11 AM

## 2023-09-18 NOTE — Transfer of Care (Signed)
 Immediate Anesthesia Transfer of Care Note  Patient: Arthur Richardson  Procedure(s) Performed: PHACOEMULSIFICATION, CATARACT, WITH IOL INSERTION 9.74 00:56.3 (Right: Eye)  Patient Location: PACU  Anesthesia Type: MAC  Level of Consciousness: awake, alert  and patient cooperative  Airway and Oxygen Therapy: Patient Spontanous Breathing and Patient connected to supplemental oxygen  Post-op Assessment: Post-op Vital signs reviewed, Patient's Cardiovascular Status Stable, Respiratory Function Stable, Patent Airway and No signs of Nausea or vomiting  Post-op Vital Signs: Reviewed and stable  Complications: No notable events documented.

## 2023-09-18 NOTE — Anesthesia Postprocedure Evaluation (Signed)
 Anesthesia Post Note  Patient: Arthur Richardson  Procedure(s) Performed: PHACOEMULSIFICATION, CATARACT, WITH IOL INSERTION 9.74 00:56.3 (Right: Eye)  Patient location during evaluation: PACU Anesthesia Type: MAC Level of consciousness: awake and alert Pain management: pain level controlled Vital Signs Assessment: post-procedure vital signs reviewed and stable Respiratory status: spontaneous breathing, nonlabored ventilation, respiratory function stable and patient connected to nasal cannula oxygen Cardiovascular status: stable and blood pressure returned to baseline Postop Assessment: no apparent nausea or vomiting Anesthetic complications: no   No notable events documented.   Last Vitals:  Vitals:   09/18/23 1129 09/18/23 1136  BP: 115/74 109/67  Pulse: 68 62  Resp: 14 15  Temp: (!) 36.2 C (!) 36.3 C  SpO2: 98% 96%    Last Pain:  Vitals:   09/18/23 1136  TempSrc:   PainSc: 0-No pain                 Savannah Morford C Caroline Longie

## 2023-09-18 NOTE — Op Note (Signed)
 OPERATIVE NOTE  Arthur Richardson [id number] 09/18/2023   PREOPERATIVE DIAGNOSIS: Nuclear sclerotic cataract right eye. H25.11   POSTOPERATIVE DIAGNOSIS: Nuclear sclerotic cataract right eye. H25.11   PROCEDURE:  Phacoemusification with posterior chamber intraocular lens placement of the right eye  Ultrasound time: Procedure(s): PHACOEMULSIFICATION, CATARACT, WITH IOL INSERTION 9.74 00:56.3 (Right)  LENS:   Implant Name Type Inv. Item Serial No. Manufacturer Lot No. LRB No. Used Action  LENS IOL TECNIS MONO 21.0 - [id number] Intraocular Lens LENS IOL TECNIS MONO 21.0 [id number] [employer]IGHTPATH  Right 1 Implanted      SURGEON:  Rosy Cooper. Donalda Fruit, MD   ANESTHESIA:  Topical with tetracaine  drops, augmented with 1% preservative-free intracameral lidocaine .   COMPLICATIONS:  None.   DESCRIPTION OF PROCEDURE:  The patient was identified in the holding room and transported to the operating room and placed in the supine position under the operating microscope.  The right eye was identified as the operative eye, which was prepped and draped in the usual sterile ophthalmic fashion.   A 1 millimeter clear-corneal paracentesis was made superotemporally. Preservative-free 1% lidocaine  mixed with 1:1,000 bisulfite-free aqueous solution of epinephrine  was injected into the anterior chamber. The anterior chamber was then filled with Viscoat viscoelastic. A 2.4 millimeter keratome was used to make a clear-corneal incision inferotemporally. A curvilinear capsulorrhexis was made with a cystotome and capsulorrhexis forceps. Balanced salt  solution was used to hydrodissect and hydrodelineate the nucleus. Phacoemulsification was then used to remove the lens nucleus and epinucleus. The remaining cortex was then removed using the irrigation and aspiration handpiece. Provisc was then placed into the capsular bag to distend it for lens placement. A +21.00 D DCB00 intraocular lens was then injected into the capsular bag.  The remaining viscoelastic was aspirated.   Wounds were hydrated with balanced salt  solution.  The anterior chamber was inflated to a physiologic pressure with balanced salt  solution.  No wound leaks were noted. Moxifloxacin  was injected intracamerally.  Timolol  and Brimonidine  drops were applied to the eye.  The patient was taken to the recovery room in stable condition without complications of anesthesia or surgery.  [name] 09/18/2023, 11:27 AM

## 2023-10-15 ENCOUNTER — Encounter: Payer: Self-pay | Admitting: Family Medicine

## 2023-10-15 ENCOUNTER — Ambulatory Visit (INDEPENDENT_AMBULATORY_CARE_PROVIDER_SITE_OTHER): Payer: Self-pay | Admitting: Family Medicine

## 2023-10-15 VITALS — BP 115/69 | HR 96 | Ht 68.0 in | Wt 190.0 lb

## 2023-10-15 DIAGNOSIS — E538 Deficiency of other specified B group vitamins: Secondary | ICD-10-CM

## 2023-10-15 DIAGNOSIS — Z0000 Encounter for general adult medical examination without abnormal findings: Secondary | ICD-10-CM | POA: Diagnosis not present

## 2023-10-15 DIAGNOSIS — R300 Dysuria: Secondary | ICD-10-CM | POA: Diagnosis not present

## 2023-10-15 DIAGNOSIS — E039 Hypothyroidism, unspecified: Secondary | ICD-10-CM

## 2023-10-15 DIAGNOSIS — E7849 Other hyperlipidemia: Secondary | ICD-10-CM

## 2023-10-15 DIAGNOSIS — G231 Progressive supranuclear ophthalmoplegia [Steele-Richardson-Olszewski]: Secondary | ICD-10-CM

## 2023-10-15 DIAGNOSIS — N401 Enlarged prostate with lower urinary tract symptoms: Secondary | ICD-10-CM | POA: Diagnosis not present

## 2023-10-15 DIAGNOSIS — I10 Essential (primary) hypertension: Secondary | ICD-10-CM

## 2023-10-15 DIAGNOSIS — D692 Other nonthrombocytopenic purpura: Secondary | ICD-10-CM

## 2023-10-15 DIAGNOSIS — G20A1 Parkinson's disease without dyskinesia, without mention of fluctuations: Secondary | ICD-10-CM | POA: Diagnosis not present

## 2023-10-15 DIAGNOSIS — N138 Other obstructive and reflux uropathy: Secondary | ICD-10-CM

## 2023-10-15 LAB — URINALYSIS, ROUTINE W REFLEX MICROSCOPIC
Bilirubin, UA: NEGATIVE
Glucose, UA: NEGATIVE
Ketones, UA: NEGATIVE
Nitrite, UA: NEGATIVE
Specific Gravity, UA: 1.025 (ref 1.005–1.030)
Urobilinogen, Ur: 1 mg/dL (ref 0.2–1.0)
pH, UA: 6 (ref 5.0–7.5)

## 2023-10-15 LAB — MICROALBUMIN, URINE WAIVED
Creatinine, Urine Waived: 200 mg/dL (ref 10–300)
Microalb, Ur Waived: 80 mg/L — ABNORMAL HIGH (ref 0–19)

## 2023-10-15 LAB — MICROSCOPIC EXAMINATION

## 2023-10-15 MED ORDER — VERAPAMIL HCL ER 120 MG PO TBCR: 120.0000 mg | EXTENDED_RELEASE_TABLET | Freq: Every day | ORAL | 1 refills | Status: AC

## 2023-10-15 MED ORDER — DULOXETINE HCL 20 MG PO CPEP: 20.0000 mg | ORAL_CAPSULE | Freq: Every day | ORAL | 1 refills | Status: AC

## 2023-10-15 MED ORDER — LOSARTAN POTASSIUM 50 MG PO TABS: 50.0000 mg | ORAL_TABLET | Freq: Every day | ORAL | 1 refills | Status: DC

## 2023-10-15 MED ORDER — FUROSEMIDE 40 MG PO TABS: 40.0000 mg | ORAL_TABLET | Freq: Every day | ORAL | 1 refills | Status: AC | PRN

## 2023-10-15 NOTE — Assessment & Plan Note (Signed)
 Rechecking labs today. Await results. Treat as needed.

## 2023-10-15 NOTE — Assessment & Plan Note (Signed)
 Under good control on current regimen. Continue current regimen. Continue to monitor. Call with any concerns. Refills given. Labs drawn today.

## 2023-10-15 NOTE — Progress Notes (Signed)
 BP 115/69 (BP Location: Left Arm, Patient Position: Sitting, Cuff Size: Normal)   Pulse 96   Ht 5' 8 (1.727 m)   Wt 190 lb (86.2 kg)   SpO2 96%   BMI 28.89 kg/m    Subjective:    Patient ID: Arthur Richardson, male    DOB: 1951/02/20, 73 y.o.   MRN: [id number]  HPI: Arthur Richardson is a 73 y.o. male presenting on 10/15/2023 for comprehensive medical examination. Current medical complaints include:  HYPERTENSION / HYPERLIPIDEMIA Satisfied with current treatment? yes Duration of hypertension: chronic BP monitoring frequency: not checking BP medication side effects: no Past BP meds: losartan , verapamil , lasix  Duration of hyperlipidemia: chronic Cholesterol medication side effects: no Cholesterol supplements: none Past cholesterol medications: none Medication compliance: excellent compliance Aspirin: yes Recent stressors: yes Recurrent headaches: no Visual changes: no Palpitations: no Dyspnea: no Chest pain: no Lower extremity edema: no Dizzy/lightheaded: no  HYPOTHYROIDISM Thyroid  control status:stable Satisfied with current treatment? yes Medication side effects: no Medication compliance: excellent compliance Recent dose adjustment:no Fatigue: yes Cold intolerance: no Heat intolerance: no Weight gain: no Weight loss: no Constipation: no Diarrhea/loose stools: no Palpitations: no Lower extremity edema: no Anxiety/depressed mood: no  He currently lives with: wife Interim Problems from his last visit: no  Depression Screen done today and results listed below:     06/17/2023    3:24 PM 04/16/2023    9:59 AM 10/28/2022    8:24 AM 07/29/2022    9:26 AM 07/01/2022    9:11 AM  Depression screen PHQ 2/9  Decreased Interest 1 0 0 0 3  Down, Depressed, Hopeless 0 0 0 0 0  PHQ - 2 Score 1 0 0 0 3  Altered sleeping  0 0 0 0  Tired, decreased energy  1 0 3 2  Change in appetite  0 0 0 0  Feeling bad or failure about yourself   0 0 0 0  Trouble concentrating  0  0 0 0  Moving slowly or fidgety/restless  3 0 3 3  Suicidal thoughts  0 0 0 0  PHQ-9 Score  4 0 6 8  Difficult doing work/chores  Somewhat difficult Not difficult at all Not difficult at all Not difficult at all    Past Medical History:  Past Medical History:  Diagnosis Date   Hyperlipidemia    Hypertension    Primary parkinsonism (HCC)    Progressive supranuclear palsy (HCC)    Wears dentures    full upper and lower, only wears upper    Surgical History:  Past Surgical History:  Procedure Laterality Date   CATARACT EXTRACTION W/PHACO Left 09/04/2023   Procedure: PHACOEMULSIFICATION, CATARACT, WITH IOL INSERTION 10.94 00:59.2;  Surgeon: Trudi Fus, MD;  Location: [facility];  Service: Ophthalmology;  Laterality: Left;   CATARACT EXTRACTION W/PHACO Right 09/18/2023   Procedure: PHACOEMULSIFICATION, CATARACT, WITH IOL INSERTION 9.74 00:56.3;  Surgeon: Trudi Fus, MD;  Location: [facility];  Service: Ophthalmology;  Laterality: Right;   LITHOTRIPSY  09/1998 & 07/29/1999   Due to nephrolithiasis   PROSTATE SURGERY     Biopsy= Negative   TONSILLECTOMY      Medications:  Current Outpatient Medications on File Prior to Visit  Medication Sig   acetaminophen  (TYLENOL ) 500 MG tablet Take 500 mg by mouth every 6 (six) hours as needed.   aspirin EC 81 MG tablet Take 81 mg by mouth daily.   cyanocobalamin  (VITAMIN B12) 1000 MCG tablet Take  1,000 mcg by mouth daily.   ibuprofen (ADVIL) 200 MG tablet Take 800 mg by mouth every 6 (six) hours as needed.   levothyroxine  (SYNTHROID ) 50 MCG tablet Take 1 tablet (50 mcg total) by mouth daily before breakfast.   Melatonin 10 MG TABS Take by mouth at bedtime.   tamsulosin  (FLOMAX ) 0.4 MG CAPS capsule Take 1 capsule (0.4 mg total) by mouth daily.   No current facility-administered medications on file prior to visit.    Allergies:  Allergies  Allergen Reactions   Penicillins Hives and Nausea And Vomiting     As child    Social History:  Social History   Socioeconomic History   Marital status: Married    Spouse name: [name]   Number of children: 2   Years of education: Not on file   Highest education level: Not on file  Occupational History   Not on file  Tobacco Use   Smoking status: Former    Current packs/day: 0.00    Average packs/day: 1 pack/day for 13.8 years (13.8 ttl pk-yrs)    Types: Cigarettes    Start date: 53    Quit date: 01/31/1980    Years since quitting: 43.7    Passive exposure: Past   Smokeless tobacco: Current    Types: Chew  Vaping Use   Vaping status: Never Used  Substance and Sexual Activity   Alcohol use: Yes    Alcohol/week: 14.0 standard drinks of alcohol    Types: 14 Cans of beer per week    Comment: 06/17/23 1 beer every 2 weeks   Drug use: No   Sexual activity: Yes  Other Topics Concern   Not on file  Social History Narrative   Not on file   Social Drivers of Health   Financial Resource Strain: Low Risk  (06/17/2023)   Overall Financial Resource Strain (CARDIA)    Difficulty of Paying Living Expenses: Not hard at all  Food Insecurity: No Food Insecurity (06/17/2023)   Hunger Vital Sign    Worried About Running Out of Food in the Last Year: Never true    Ran Out of Food in the Last Year: Never true  Transportation Needs: No Transportation Needs (06/17/2023)   PRAPARE - Administrator, Civil Service (Medical): No    Lack of Transportation (Non-Medical): No  Physical Activity: Sufficiently Active (06/17/2023)   Exercise Vital Sign    Days of Exercise per Week: 7 days    Minutes of Exercise per Session: 60 min  Stress: No Stress Concern Present (06/17/2023)   [employer] of Occupational Health - Occupational Stress Questionnaire    Feeling of Stress : Not at all  Social Connections: Moderately Integrated (06/17/2023)   Social Connection and Isolation Panel    Frequency of Communication with Friends and Family: Twice a week     Frequency of Social Gatherings with Friends and Family: More than three times a week    Attends Religious Services: More than 4 times per year    Active Member of [employer] or Organizations: No    Attends Banker Meetings: Never    Marital Status: Married  Catering manager Violence: Not At Risk (06/17/2023)   Humiliation, Afraid, Rape, and Kick questionnaire    Fear of Current or Ex-Partner: No    Emotionally Abused: No    Physically Abused: No    Sexually Abused: No   Social History   Tobacco Use  Smoking Status Former   Current packs/day:  0.00   Average packs/day: 1 pack/day for 13.8 years (13.8 ttl pk-yrs)   Types: Cigarettes   Start date: 41   Quit date: 01/31/1980   Years since quitting: 43.7   Passive exposure: Past  Smokeless Tobacco Current   Types: Chew   Social History   Substance and Sexual Activity  Alcohol Use Yes   Alcohol/week: 14.0 standard drinks of alcohol   Types: 14 Cans of beer per week   Comment: 06/17/23 1 beer every 2 weeks    Family History:  Family History  Problem Relation Age of Onset   Diabetes Mother    Stroke Father    Cancer Sister        Pancreatic Cancer   Parkinson's disease Neg Hx    Tremor Neg Hx     Past medical history, surgical history, medications, allergies, family history and social history reviewed with patient today and changes made to appropriate areas of the chart.   Review of Systems  Constitutional: Negative.   HENT: Negative.    Eyes: Negative.   Respiratory:  Positive for shortness of breath. Negative for cough, hemoptysis, sputum production and wheezing.   Cardiovascular:  Positive for leg swelling. Negative for chest pain, palpitations, orthopnea, claudication and PND.  Gastrointestinal:  Positive for constipation. Negative for abdominal pain, blood in stool, diarrhea, heartburn, melena, nausea and vomiting.  Genitourinary:  Positive for dysuria. Negative for flank pain, frequency, hematuria and  urgency.  Musculoskeletal: Negative.   Skin: Negative.   Neurological:  Positive for dizziness, tingling, tremors, speech change and weakness. Negative for sensory change, focal weakness, seizures, loss of consciousness and headaches.  Endo/Heme/Allergies:  Bruises/bleeds easily.  Psychiatric/Behavioral: Negative.     All other ROS negative except what is listed above and in the HPI.      Objective:    BP 115/69 (BP Location: Left Arm, Patient Position: Sitting, Cuff Size: Normal)   Pulse 96   Ht 5' 8 (1.727 m)   Wt 190 lb (86.2 kg)   SpO2 96%   BMI 28.89 kg/m   Wt Readings from Last 3 Encounters:  10/15/23 190 lb (86.2 kg)  09/18/23 190 lb (86.2 kg)  09/04/23 190 lb (86.2 kg)    Physical Exam Vitals and nursing note reviewed.  Constitutional:      General: He is not in acute distress.    Appearance: Normal appearance. He is obese. He is not ill-appearing, toxic-appearing or diaphoretic.  HENT:     Head: Normocephalic and atraumatic.     Right Ear: Tympanic membrane, ear canal and external ear normal. There is no impacted cerumen.     Left Ear: Tympanic membrane, ear canal and external ear normal. There is no impacted cerumen.     Nose: Nose normal. No congestion or rhinorrhea.     Mouth/Throat:     Mouth: Mucous membranes are moist.     Pharynx: Oropharynx is clear. No oropharyngeal exudate or posterior oropharyngeal erythema.   Eyes:     General: No scleral icterus.       Right eye: No discharge.        Left eye: No discharge.     Extraocular Movements: Extraocular movements intact.     Conjunctiva/sclera: Conjunctivae normal.     Pupils: Pupils are equal, round, and reactive to light.   Neck:     Vascular: No carotid bruit.   Cardiovascular:     Rate and Rhythm: Normal rate and regular rhythm.     Pulses:  Normal pulses.     Heart sounds: No murmur heard.    No friction rub. No gallop.  Pulmonary:     Effort: Pulmonary effort is normal. No respiratory  distress.     Breath sounds: Normal breath sounds. No stridor. No wheezing, rhonchi or rales.  Chest:     Chest wall: No tenderness.  Abdominal:     General: Abdomen is flat. Bowel sounds are normal. There is no distension.     Palpations: Abdomen is soft. There is no mass.     Tenderness: There is no abdominal tenderness. There is no right CVA tenderness, left CVA tenderness, guarding or rebound.     Hernia: No hernia is present.  Genitourinary:    Comments: Genital exam deferred with shared decision making  Musculoskeletal:        General: No swelling, tenderness, deformity or signs of injury.     Cervical back: Normal range of motion and neck supple. No rigidity. No muscular tenderness.     Right lower leg: Edema (1+) present.     Left lower leg: Edema (1+) present.  Lymphadenopathy:     Cervical: No cervical adenopathy.   Skin:    General: Skin is warm and dry.     Capillary Refill: Capillary refill takes less than 2 seconds.     Coloration: Skin is not jaundiced or pale.     Findings: No bruising, erythema, lesion or rash.   Neurological:     General: No focal deficit present.     Mental Status: He is alert and oriented to person, place, and time.     Cranial Nerves: No cranial nerve deficit.     Sensory: No sensory deficit.     Motor: Weakness present.     Coordination: Coordination normal.     Gait: Gait abnormal.     Deep Tendon Reflexes: Reflexes normal.     Comments: + tremor, + soft voice  Psychiatric:        Mood and Affect: Mood normal.        Behavior: Behavior normal.        Thought Content: Thought content normal.        Judgment: Judgment normal.     Results for orders placed or performed in visit on 07/01/23  BLADDER SCAN AMB NON-IMAGING   Collection Time: 07/01/23  9:23 AM  Result Value Ref Range   Scan Result 84ml       Assessment & Plan:   Problem List Items Addressed This Visit       Cardiovascular and Mediastinum   Hypertension   Under  good control on current regimen. Continue current regimen. Continue to monitor. Call with any concerns. Refills given. Labs drawn today.        Relevant Medications   furosemide  (LASIX ) 40 MG tablet   losartan  (COZAAR ) 50 MG tablet   verapamil  (CALAN -SR) 120 MG CR tablet   Other Relevant Orders   Comprehensive metabolic panel with GFR   CBC with Differential/Platelet   Microalbumin, Urine Waived   Senile purpura (HCC)   Reassured patient. Call with any concerns.       Relevant Medications   furosemide  (LASIX ) 40 MG tablet   losartan  (COZAAR ) 50 MG tablet   verapamil  (CALAN -SR) 120 MG CR tablet     Endocrine   Hypothyroidism   Rechecking labs today. Await results. Treat as needed.       Relevant Orders   Comprehensive metabolic panel with GFR  CBC with Differential/Platelet   TSH     Nervous and Auditory   Primary Parkinson's disease (HCC)   Continue to follow with neurology- due for follow up with neurology in July. Continue to monitor.       PSP (progressive supranuclear palsy) (HCC)   Continue to follow with neurology- due for follow up with neurology in July. Continue to monitor.         Genitourinary   BPH with obstruction/lower urinary tract symptoms   Under good control on current regimen. Continue current regimen. Continue to monitor. Call with any concerns. Refills given. Labs drawn today.         Relevant Orders   Comprehensive metabolic panel with GFR   CBC with Differential/Platelet   PSA     Other   Hyperlipidemia   Rechecking labs today. Await results. Treat as needed.       Relevant Medications   furosemide  (LASIX ) 40 MG tablet   losartan  (COZAAR ) 50 MG tablet   verapamil  (CALAN -SR) 120 MG CR tablet   Other Relevant Orders   Comprehensive metabolic panel with GFR   CBC with Differential/Platelet   Lipid Panel w/o Chol/HDL Ratio   B12 deficiency   Rechecking labs today. Await results.       Relevant Orders   Comprehensive metabolic  panel with GFR   CBC with Differential/Platelet   B12   Other Visit Diagnoses       Routine general medical examination at a health care facility    -  Primary   Vaccines up to date/declined. Screening labs checked today. Colonoscopy up to date. Continue diet and exercise. Call with any concerns.     Dysuria       Will check urine. Await results.   Relevant Orders   Urinalysis, Routine w reflex microscopic        LABORATORY TESTING:  Health maintenance labs ordered today as discussed above.   The natural history of prostate cancer and ongoing controversy regarding screening and potential treatment outcomes of prostate cancer has been discussed with the patient. The meaning of a false positive PSA and a false negative PSA has been discussed. He indicates understanding of the limitations of this screening test and wishes to proceed with screening PSA testing.   IMMUNIZATIONS:   - Tdap: Tetanus vaccination status reviewed: last tetanus booster within 10 years. - Influenza: Up to date - Pneumovax: Up to date - Prevnar: Up to date - COVID: Refused - HPV: Not applicable - Shingrix vaccine: Refused  SCREENING: - Colonoscopy: Up to date  Discussed with patient purpose of the colonoscopy is to detect colon cancer at curable precancerous or early stages   PATIENT COUNSELING:    Sexuality: Discussed sexually transmitted diseases, partner selection, use of condoms, avoidance of unintended pregnancy  and contraceptive alternatives.   Advised to avoid cigarette smoking.  I discussed with the patient that most people either abstain from alcohol or drink within safe limits (<=14/week and <=4 drinks/occasion for males, <=7/weeks and <= 3 drinks/occasion for females) and that the risk for alcohol disorders and other health effects rises proportionally with the number of drinks per week and how often a drinker exceeds daily limits.  Discussed cessation/primary prevention of drug use and  availability of treatment for abuse.   Diet: Encouraged to adjust caloric intake to maintain  or achieve ideal body weight, to reduce intake of dietary saturated fat and total fat, to limit sodium intake by avoiding high sodium foods and  not adding table salt, and to maintain adequate dietary potassium and calcium  preferably from fresh fruits, vegetables, and low-fat dairy products.    stressed the importance of regular exercise  Injury prevention: Discussed safety belts, safety helmets, smoke detector, smoking near bedding or upholstery.   Dental health: Discussed importance of regular tooth brushing, flossing, and dental visits.   Follow up plan: NEXT PREVENTATIVE PHYSICAL DUE IN 1 YEAR. Return in about 6 months (around 04/15/2024).

## 2023-10-15 NOTE — Assessment & Plan Note (Signed)
 Rechecking labs today. Await results.

## 2023-10-15 NOTE — Assessment & Plan Note (Signed)
Reassured patient. Call with any concerns.  

## 2023-10-15 NOTE — Assessment & Plan Note (Signed)
 Continue to follow with neurology- due for follow up with neurology in July. Continue to monitor.

## 2023-10-16 LAB — CBC WITH DIFFERENTIAL/PLATELET
Basophils Absolute: 0.1 10*3/uL (ref 0.0–0.2)
Basos: 1 %
EOS (ABSOLUTE): 0.3 10*3/uL (ref 0.0–0.4)
Eos: 5 %
Hematocrit: 39.6 % (ref 37.5–51.0)
Hemoglobin: 12.7 g/dL — ABNORMAL LOW (ref 13.0–17.7)
Immature Grans (Abs): 0 10*3/uL (ref 0.0–0.1)
Immature Granulocytes: 0 %
Lymphocytes Absolute: 2.6 10*3/uL (ref 0.7–3.1)
Lymphs: 37 %
MCH: 30.5 pg (ref 26.6–33.0)
MCHC: 32.1 g/dL (ref 31.5–35.7)
MCV: 95 fL (ref 79–97)
Monocytes Absolute: 0.7 10*3/uL (ref 0.1–0.9)
Monocytes: 9 %
Neutrophils Absolute: 3.4 10*3/uL (ref 1.4–7.0)
Neutrophils: 48 %
Platelets: 244 10*3/uL (ref 150–450)
RBC: 4.16 x10E6/uL (ref 4.14–5.80)
RDW: 12 % (ref 11.6–15.4)
WBC: 7 10*3/uL (ref 3.4–10.8)

## 2023-10-16 LAB — LIPID PANEL W/O CHOL/HDL RATIO
Cholesterol, Total: 188 mg/dL (ref 100–199)
HDL: 48 mg/dL (ref >39)
LDL Chol Calc (NIH): 122 mg/dL — ABNORMAL HIGH (ref 0–99)
Triglycerides: 99 mg/dL (ref 0–149)
VLDL Cholesterol Cal: 18 mg/dL (ref 5–40)

## 2023-10-16 LAB — COMPREHENSIVE METABOLIC PANEL WITH GFR
ALT: 11 IU/L (ref 0–44)
AST: 16 IU/L (ref 0–40)
Albumin: 4.1 g/dL (ref 3.8–4.8)
Alkaline Phosphatase: 122 IU/L — ABNORMAL HIGH (ref 44–121)
BUN/Creatinine Ratio: 20 (ref 10–24)
BUN: 19 mg/dL (ref 8–27)
Bilirubin Total: 0.3 mg/dL (ref 0.0–1.2)
CO2: 21 mmol/L (ref 20–29)
Calcium: 9.7 mg/dL (ref 8.6–10.2)
Chloride: 108 mmol/L — ABNORMAL HIGH (ref 96–106)
Creatinine, Ser: 0.95 mg/dL (ref 0.76–1.27)
Globulin, Total: 2.2 g/dL (ref 1.5–4.5)
Glucose: 78 mg/dL (ref 70–99)
Potassium: 4.4 mmol/L (ref 3.5–5.2)
Sodium: 144 mmol/L (ref 134–144)
Total Protein: 6.3 g/dL (ref 6.0–8.5)
eGFR: 84 mL/min/{1.73_m2} (ref >59)

## 2023-10-16 LAB — TSH: TSH: 1.91 u[IU]/mL (ref 0.450–4.500)

## 2023-10-16 LAB — VITAMIN B12: Vitamin B-12: 1541 pg/mL — ABNORMAL HIGH (ref 232–1245)

## 2023-10-16 LAB — PSA: Prostate Specific Ag, Serum: 4.9 ng/mL — ABNORMAL HIGH (ref 0.0–4.0)

## 2023-10-20 ENCOUNTER — Ambulatory Visit: Payer: Self-pay | Admitting: Family Medicine

## 2023-10-20 NOTE — Progress Notes (Signed)
 Letter printed and mailed.

## 2023-10-27 ENCOUNTER — Ambulatory Visit: Payer: Self-pay

## 2023-10-27 NOTE — Telephone Encounter (Signed)
 FYI Only or Action Required?: Action required by provider: medication refill request.  Patient was last seen in primary care on 10/15/2023 by Vicci Bouchard P, DO. Called Nurse Triage reporting Leg Pain. Symptoms began a week ago. Interventions attempted: OTC medications: Tylenol  and Ibuprofen  and Ice/heat application. Symptoms are: unchanged.  Triage Disposition: See HCP Within 4 Hours (Or PCP Triage)  Patient/caregiver understands and will follow disposition?: No, wishes to speak with PCP  Copied from CRM [phone]. Topic: Clinical - Red Word Triage >> Oct 27, 2023 10:18 AM Thersia BROCKS wrote: [facility] that prompted transfer to Nurse Triage: Patient wife called in stated that patient is in sever pain going down his leg , wanted some pain medication Reason for Disposition  [1] Thigh or calf pain AND [2] only 1 side AND [3] present > 1 hour (Exception: Chronic unchanged pain.)    Pt reports L leg pain for a week. Refusing to be seen by anyone besides PCP.  Answer Assessment - Initial Assessment Questions Pt's PCP does not have availability until August 19th, pt also refuses to see any of the other MD in the office that has appmnt availability for today or sooner despite explaining that MD's can consult on another regarding their patients for the pt's care. Pt does not want UC either. Pt and wife requesting another kind of pain med for pt d/t his new leg pain. Nurse explains bc pt does not want to come in for further eval to please monitor if s/s worsen or pt develops diff/chest tightness/pain. Wife states pt does not have those s/s. Pt's wife states, Nothing gets done. Then hangs up.  1. ONSET: When did the pain start?      About a week  2. LOCATION: Where is the pain located?      Down Left leg q 15 minutes  3. PAIN: How bad is the pain?    (Scale 1-10; or mild, moderate, severe)   -  MILD (1-3): doesn't interfere with normal activities    -  MODERATE (4-7): interferes with normal  activities (e.g., work or school) or awakens from sleep, limping    -  SEVERE (8-10): excruciating pain, unable to do any normal activities, unable to walk     10/10  4. WORK OR EXERCISE: Has there been any recent work or exercise that involved this part of the body?      No - maybe siatica  5. CAUSE: What do you think is causing the leg pain?     Siatica   6. OTHER SYMPTOMS: Do you have any other symptoms? (e.g., chest pain, back pain, breathing difficulty, swelling, rash, fever, numbness, weakness)     L leg pain down leg middle of thigh down.  Numb/tingle in both feet - started when this pain started.  Pain so bad pt wants to puke  Protocols used: Leg Pain-A-AH

## 2023-11-13 DIAGNOSIS — M25552 Pain in left hip: Secondary | ICD-10-CM | POA: Diagnosis not present

## 2023-11-13 DIAGNOSIS — G4752 REM sleep behavior disorder: Secondary | ICD-10-CM | POA: Diagnosis not present

## 2023-11-13 DIAGNOSIS — G20C Parkinsonism, unspecified: Secondary | ICD-10-CM | POA: Diagnosis not present

## 2023-11-13 DIAGNOSIS — Z8739 Personal history of other diseases of the musculoskeletal system and connective tissue: Secondary | ICD-10-CM | POA: Diagnosis not present

## 2023-11-13 DIAGNOSIS — M25551 Pain in right hip: Secondary | ICD-10-CM | POA: Diagnosis not present

## 2024-01-30 ENCOUNTER — Other Ambulatory Visit: Payer: Self-pay | Admitting: Family Medicine

## 2024-01-30 DIAGNOSIS — E039 Hypothyroidism, unspecified: Secondary | ICD-10-CM

## 2024-01-30 NOTE — Telephone Encounter (Signed)
 Requested Prescriptions  Pending Prescriptions Disp Refills   levothyroxine  (SYNTHROID ) 50 MCG tablet [Pharmacy Med Name: ALVOGEN-LEVOTHYROXINE  TAB 50MCG] 100 tablet 1    Sig: TAKE 1 TABLET BY MOUTH DAILY  BEFORE BREAKFAST     Endocrinology:  Hypothyroid Agents Passed - 01/30/2024  4:08 PM      Passed - TSH in normal range and within 360 days    TSH  Date Value Ref Range Status  10/15/2023 1.910 0.450 - 4.500 uIU/mL Final         Passed - Valid encounter within last 12 months    Recent Outpatient Visits           3 months ago Routine general medical examination at a health care facility   [facility] [place], Duwaine SQUIBB, DO       Future Appointments             In 5 months [place], Clotilda DELENA RIGGERS [facility]

## 2024-02-25 ENCOUNTER — Ambulatory Visit (INDEPENDENT_AMBULATORY_CARE_PROVIDER_SITE_OTHER)

## 2024-02-25 DIAGNOSIS — Z23 Encounter for immunization: Secondary | ICD-10-CM

## 2024-02-26 NOTE — Progress Notes (Signed)
 Patient is in office today for a nurse visit for flu vaccine. Patient Injection was given in the  Right deltoid. Patient tolerated injection well.

## 2024-04-12 ENCOUNTER — Telehealth: Payer: Self-pay

## 2024-04-12 NOTE — Telephone Encounter (Signed)
-----   Message from Duwaine Louder, DO sent at 04/05/2024 10:57 AM EST ----- Can we get a Rx for a Wheelchair dx: parkinsons

## 2024-04-12 NOTE — Telephone Encounter (Signed)
 Order placed via [facility] and sent to [facility].

## 2024-04-15 ENCOUNTER — Encounter: Payer: Self-pay | Admitting: Family Medicine

## 2024-04-15 ENCOUNTER — Ambulatory Visit: Admitting: Family Medicine

## 2024-04-15 VITALS — BP 102/68 | HR 67 | Temp 98.0°F | Ht 68.0 in | Wt 178.0 lb

## 2024-04-15 DIAGNOSIS — G20A1 Parkinson's disease without dyskinesia, without mention of fluctuations: Secondary | ICD-10-CM

## 2024-04-15 DIAGNOSIS — E039 Hypothyroidism, unspecified: Secondary | ICD-10-CM | POA: Diagnosis not present

## 2024-04-15 DIAGNOSIS — E538 Deficiency of other specified B group vitamins: Secondary | ICD-10-CM | POA: Diagnosis not present

## 2024-04-15 DIAGNOSIS — N401 Enlarged prostate with lower urinary tract symptoms: Secondary | ICD-10-CM | POA: Diagnosis not present

## 2024-04-15 DIAGNOSIS — I10 Essential (primary) hypertension: Secondary | ICD-10-CM

## 2024-04-15 DIAGNOSIS — N138 Other obstructive and reflux uropathy: Secondary | ICD-10-CM | POA: Diagnosis not present

## 2024-04-15 DIAGNOSIS — E7849 Other hyperlipidemia: Secondary | ICD-10-CM

## 2024-04-15 NOTE — Assessment & Plan Note (Signed)
 Rechecking labs today. Await results. Treat as needed.

## 2024-04-15 NOTE — Assessment & Plan Note (Signed)
 Continue to follow with neurology- they have suggested palliative care, but he and is wife are not ready for that. Continue ot monitor.

## 2024-04-15 NOTE — Assessment & Plan Note (Signed)
 Under good control on current regimen. Continue current regimen. Continue to monitor. Call with any concerns. Refills given. Labs drawn today.

## 2024-04-15 NOTE — Progress Notes (Signed)
 BP 102/68   Pulse 67   Temp 98 F (36.7 C) (Oral)   Ht 5' 8 (1.727 m)   Wt 178 lb (80.7 kg)   SpO2 97%   BMI 27.06 kg/m    Subjective:    Patient ID: Arthur Richardson, male    DOB: [birth year], 73 y.o.   MRN: [id number]  HPI: Arthur Richardson is a 73 y.o. male  Chief Complaint  Patient presents with   Hypertension   Hypothyroidism   HYPOTHYROIDISM Thyroid  control status:controlled Satisfied with current treatment? yes Medication side effects: no Medication compliance: excellent compliance Recent dose adjustment:no Fatigue: yes Cold intolerance: no Heat intolerance: no Weight gain: no Weight loss: no Constipation: no Diarrhea/loose stools: no Palpitations: no Lower extremity edema: no Anxiety/depressed mood: no  HYPERTENSION / HYPERLIPIDEMIA Satisfied with current treatment? yes Duration of hypertension: chronic BP monitoring frequency: rarely BP medication side effects: no Past BP meds: verapamil , lasix , losartan  Duration of hyperlipidemia: chronic Cholesterol medication side effects: no Cholesterol supplements: none Past cholesterol medications: none Medication compliance: excellent compliance Aspirin: yes Recent stressors: no Recurrent headaches: no Visual changes: no Palpitations: no Dyspnea: no Chest pain: no Lower extremity edema: no Dizzy/lightheaded: no  Relevant past medical, surgical, family and social history reviewed and updated as indicated. Interim medical history since our last visit reviewed. Allergies and medications reviewed and updated.  Review of Systems  Constitutional: Negative.   Respiratory: Negative.    Cardiovascular: Negative.   Gastrointestinal: Negative.   Musculoskeletal:  Positive for arthralgias and myalgias. Negative for back pain, gait problem, joint swelling, neck pain and neck stiffness.  Skin: Negative.   Neurological:  Positive for tremors, speech difficulty and weakness. Negative for dizziness,  seizures, syncope, facial asymmetry, light-headedness, numbness and headaches.  Psychiatric/Behavioral: Negative.      Per HPI unless specifically indicated above     Objective:    BP 102/68   Pulse 67   Temp 98 F (36.7 C) (Oral)   Ht 5' 8 (1.727 m)   Wt 178 lb (80.7 kg)   SpO2 97%   BMI 27.06 kg/m   Wt Readings from Last 3 Encounters:  04/15/24 178 lb (80.7 kg)  10/15/23 190 lb (86.2 kg)  09/18/23 190 lb (86.2 kg)    Physical Exam Vitals and nursing note reviewed.  Constitutional:      General: He is not in acute distress.    Appearance: Normal appearance. He is not ill-appearing, toxic-appearing or diaphoretic.     Comments: Wheelchair bound, very soft spoken  HENT:     Head: Normocephalic and atraumatic.     Right Ear: External ear normal.     Left Ear: External ear normal.     Nose: Nose normal.     Mouth/Throat:     Mouth: Mucous membranes are moist.     Pharynx: Oropharynx is clear.  Eyes:     General: No scleral icterus.       Right eye: No discharge.        Left eye: No discharge.     Extraocular Movements: Extraocular movements intact.     Conjunctiva/sclera: Conjunctivae normal.     Pupils: Pupils are equal, round, and reactive to light.  Cardiovascular:     Rate and Rhythm: Normal rate and regular rhythm.     Pulses: Normal pulses.     Heart sounds: Normal heart sounds. No murmur heard.    No friction rub. No gallop.  Pulmonary:     Effort:  Pulmonary effort is normal. No respiratory distress.     Breath sounds: Normal breath sounds. No stridor. No wheezing, rhonchi or rales.  Chest:     Chest wall: No tenderness.  Musculoskeletal:        General: Normal range of motion.     Cervical back: Normal range of motion and neck supple.  Skin:    General: Skin is warm and dry.     Capillary Refill: Capillary refill takes less than 2 seconds.     Coloration: Skin is not jaundiced or pale.     Findings: No bruising, erythema, lesion or rash.   Neurological:     General: No focal deficit present.     Mental Status: He is alert and oriented to person, place, and time. Mental status is at baseline.  Psychiatric:        Mood and Affect: Mood normal.        Behavior: Behavior normal.        Thought Content: Thought content normal.        Judgment: Judgment normal.     Results for orders placed or performed in visit on 10/15/23  Microalbumin, Urine Waived   Collection Time: 10/15/23  9:46 AM  Result Value Ref Range   Microalb, Ur Waived 80 (H) 0 - 19 mg/L   Creatinine, Urine Waived 200 10 - 300 mg/dL   Microalb/Creat Ratio 30-300 (H) <30 mg/g  Comprehensive metabolic panel with GFR   Collection Time: 10/15/23  9:47 AM  Result Value Ref Range   Glucose 78 70 - 99 mg/dL   BUN 19 8 - 27 mg/dL   Creatinine, Ser 9.04 0.76 - 1.27 mg/dL   eGFR 84 >40 fO/fpw/8.26   BUN/Creatinine Ratio 20 10 - 24   Sodium 144 134 - 144 mmol/L   Potassium 4.4 3.5 - 5.2 mmol/L   Chloride 108 (H) 96 - 106 mmol/L   CO2 21 20 - 29 mmol/L   Calcium  9.7 8.6 - 10.2 mg/dL   Total Protein 6.3 6.0 - 8.5 g/dL   Albumin 4.1 3.8 - 4.8 g/dL   Globulin, Total 2.2 1.5 - 4.5 g/dL   Bilirubin Total 0.3 0.0 - 1.2 mg/dL   Alkaline Phosphatase 122 (H) 44 - 121 IU/L   AST 16 0 - 40 IU/L   ALT 11 0 - 44 IU/L  CBC with Differential/Platelet   Collection Time: 10/15/23  9:47 AM  Result Value Ref Range   WBC 7.0 3.4 - 10.8 x10E3/uL   RBC 4.16 4.14 - 5.80 x10E6/uL   Hemoglobin 12.7 (L) 13.0 - 17.7 g/dL   Hematocrit 60.3 62.4 - 51.0 %   MCV 95 79 - 97 fL   MCH 30.5 26.6 - 33.0 pg   MCHC 32.1 31.5 - 35.7 g/dL   RDW 87.9 88.3 - 84.5 %   Platelets 244 150 - 450 x10E3/uL   Neutrophils 48 Not Estab. %   Lymphs 37 Not Estab. %   Monocytes 9 Not Estab. %   Eos 5 Not Estab. %   Basos 1 Not Estab. %   Neutrophils Absolute 3.4 1.4 - 7.0 x10E3/uL   Lymphocytes Absolute 2.6 0.7 - 3.1 x10E3/uL   Monocytes Absolute 0.7 0.1 - 0.9 x10E3/uL   EOS (ABSOLUTE) 0.3 0.0 - 0.4  x10E3/uL   Basophils Absolute 0.1 0.0 - 0.2 x10E3/uL   Immature Granulocytes 0 Not Estab. %   Immature Grans (Abs) 0.0 0.0 - 0.1 x10E3/uL  Lipid Panel w/o Chol/HDL Ratio  Collection Time: 10/15/23  9:47 AM  Result Value Ref Range   Cholesterol, Total 188 100 - 199 mg/dL   Triglycerides 99 0 - 149 mg/dL   HDL 48 >60 mg/dL   VLDL Cholesterol Cal 18 5 - 40 mg/dL   LDL Chol Calc (NIH) 877 (H) 0 - 99 mg/dL  PSA   Collection Time: 10/15/23  9:47 AM  Result Value Ref Range   Prostate Specific Ag, Serum 4.9 (H) 0.0 - 4.0 ng/mL  TSH   Collection Time: 10/15/23  9:47 AM  Result Value Ref Range   TSH 1.910 0.450 - 4.500 uIU/mL  B12   Collection Time: 10/15/23  9:47 AM  Result Value Ref Range   Vitamin B-12 1,541 (H) 232 - 1,245 pg/mL  Microscopic Examination   Collection Time: 10/15/23 10:25 AM   Urine  Result Value Ref Range   WBC, UA 11-30 (A) 0 - 5 /hpf   RBC, Urine 0-2 0 - 2 /hpf   Epithelial Cells (non renal) 0-10 0 - 10 /hpf   Bacteria, UA Few None seen/Few  Urinalysis, Routine w reflex microscopic   Collection Time: 10/15/23 10:25 AM  Result Value Ref Range   Specific Gravity, UA 1.025 1.005 - 1.030   pH, UA 6.0 5.0 - 7.5   Color, UA Yellow Yellow   Appearance Ur Clear Clear   Leukocytes,UA 1+ (A) Negative   Protein,UA 1+ (A) Negative/Trace   Glucose, UA Negative Negative   Ketones, UA Negative Negative   RBC, UA 1+ (A) Negative   Bilirubin, UA Negative Negative   Urobilinogen, Ur 1.0 0.2 - 1.0 mg/dL   Nitrite, UA Negative Negative   Microscopic Examination See below:       Assessment & Plan:   Problem List Items Addressed This Visit       Cardiovascular and Mediastinum   Hypertension - Primary   Under good control on current regimen. Continue current regimen. Continue to monitor. Call with any concerns. Refills given. Labs drawn today.        Relevant Orders   CBC with Differential/Platelet   Comprehensive metabolic panel with GFR     Endocrine    Hypothyroidism   Rechecking labs today. Await results. Treat as needed.       Relevant Orders   CBC with Differential/Platelet   Comprehensive metabolic panel with GFR   TSH     Nervous and Auditory   Primary Parkinson's disease (HCC)   Continue to follow with neurology- they have suggested palliative care, but he and is wife are not ready for that. Continue ot monitor.       Relevant Orders   CBC with Differential/Platelet   Comprehensive metabolic panel with GFR     Genitourinary   BPH with obstruction/lower urinary tract symptoms   Under good control on current regimen. Continue current regimen. Continue to monitor. Call with any concerns. Refills given. Labs drawn today.        Relevant Orders   CBC with Differential/Platelet   Comprehensive metabolic panel with GFR   PSA     Other   Hyperlipidemia   Rechecking labs today. Await results. Treat as needed.       Relevant Orders   CBC with Differential/Platelet   Comprehensive metabolic panel with GFR   Lipid Panel w/o Chol/HDL Ratio   B12 deficiency   Rechecking labs today. Await results. Treat as needed.       Relevant Orders   CBC with Differential/Platelet  Comprehensive metabolic panel with GFR   B12     Follow up plan: Return in about 6 months (around 10/14/2024) for physical.

## 2024-04-16 ENCOUNTER — Other Ambulatory Visit: Payer: Self-pay | Admitting: Family Medicine

## 2024-04-16 ENCOUNTER — Ambulatory Visit: Payer: Self-pay | Admitting: Family Medicine

## 2024-04-16 DIAGNOSIS — I10 Essential (primary) hypertension: Secondary | ICD-10-CM

## 2024-04-16 LAB — CBC WITH DIFFERENTIAL/PLATELET
Basophils Absolute: 0.1 x10E3/uL (ref 0.0–0.2)
Basos: 1 %
EOS (ABSOLUTE): 0.4 x10E3/uL (ref 0.0–0.4)
Eos: 6 %
Hematocrit: 38.1 % (ref 37.5–51.0)
Hemoglobin: 12.3 g/dL — ABNORMAL LOW (ref 13.0–17.7)
Immature Grans (Abs): 0 x10E3/uL (ref 0.0–0.1)
Immature Granulocytes: 0 %
Lymphocytes Absolute: 2.2 x10E3/uL (ref 0.7–3.1)
Lymphs: 35 %
MCH: 30.6 pg (ref 26.6–33.0)
MCHC: 32.3 g/dL (ref 31.5–35.7)
MCV: 95 fL (ref 79–97)
Monocytes Absolute: 0.6 x10E3/uL (ref 0.1–0.9)
Monocytes: 9 %
Neutrophils Absolute: 3.2 x10E3/uL (ref 1.4–7.0)
Neutrophils: 49 %
Platelets: 257 x10E3/uL (ref 150–450)
RBC: 4.02 x10E6/uL — ABNORMAL LOW (ref 4.14–5.80)
RDW: 12.5 % (ref 11.6–15.4)
WBC: 6.5 x10E3/uL (ref 3.4–10.8)

## 2024-04-16 LAB — COMPREHENSIVE METABOLIC PANEL WITH GFR
ALT: 7 IU/L (ref 0–44)
AST: 9 IU/L (ref 0–40)
Albumin: 3.9 g/dL (ref 3.8–4.8)
Alkaline Phosphatase: 119 IU/L (ref 47–123)
BUN/Creatinine Ratio: 18 (ref 10–24)
BUN: 16 mg/dL (ref 8–27)
Bilirubin Total: 0.4 mg/dL (ref 0.0–1.2)
CO2: 22 mmol/L (ref 20–29)
Calcium: 9.3 mg/dL (ref 8.6–10.2)
Chloride: 103 mmol/L (ref 96–106)
Creatinine, Ser: 0.87 mg/dL (ref 0.76–1.27)
Globulin, Total: 2.2 g/dL (ref 1.5–4.5)
Glucose: 85 mg/dL (ref 70–99)
Potassium: 4 mmol/L (ref 3.5–5.2)
Sodium: 139 mmol/L (ref 134–144)
Total Protein: 6.1 g/dL (ref 6.0–8.5)
eGFR: 90 mL/min/1.73

## 2024-04-16 LAB — LIPID PANEL W/O CHOL/HDL RATIO
Cholesterol, Total: 202 mg/dL — ABNORMAL HIGH (ref 100–199)
HDL: 46 mg/dL
LDL Chol Calc (NIH): 141 mg/dL — ABNORMAL HIGH (ref 0–99)
Triglycerides: 85 mg/dL (ref 0–149)
VLDL Cholesterol Cal: 15 mg/dL (ref 5–40)

## 2024-04-16 LAB — PSA: Prostate Specific Ag, Serum: 3.8 ng/mL (ref 0.0–4.0)

## 2024-04-16 LAB — TSH: TSH: 1.45 u[IU]/mL (ref 0.450–4.500)

## 2024-04-16 LAB — VITAMIN B12: Vitamin B-12: >2000 pg/mL — ABNORMAL HIGH (ref 232–1245)

## 2024-04-20 NOTE — Telephone Encounter (Signed)
 Requested Prescriptions  Pending Prescriptions Disp Refills   losartan  (COZAAR ) 50 MG tablet [Pharmacy Med Name: Losartan  Potassium 50 MG Oral Tablet] 100 tablet 1    Sig: TAKE 1 TABLET BY MOUTH DAILY     Cardiovascular:  Angiotensin Receptor Blockers Passed - 04/20/2024 11:17 AM      Passed - Cr in normal range and within 180 days    Creatinine, Ser  Date Value Ref Range Status  04/15/2024 0.87 0.76 - 1.27 mg/dL Final         Passed - K in normal range and within 180 days    Potassium  Date Value Ref Range Status  04/15/2024 4.0 3.5 - 5.2 mmol/L Final         Passed - Patient is not pregnant      Passed - Last BP in normal range    BP Readings from Last 1 Encounters:  04/15/24 102/68         Passed - Valid encounter within last 6 months    Recent Outpatient Visits           5 days ago Primary hypertension   [facility] Douglas, Megan P, DO   6 months ago Routine general medical examination at a health care facility   [facility] Vicci Duwaine SQUIBB, DO       Future Appointments             In 2 months McGowan, Clotilda DELENA RIGGERS [facility] [place]

## 2024-06-30 ENCOUNTER — Ambulatory Visit: Admitting: Urology

## 2024-10-14 ENCOUNTER — Encounter: Admitting: Family Medicine
